# Patient Record
Sex: Male | Born: 1945
Health system: Southern US, Community
[De-identification: ages and names within clinical notes are randomized; demographics above are authoritative.]

## PROBLEM LIST (undated history)

## (undated) DIAGNOSIS — Z9889 Other specified postprocedural states: Secondary | ICD-10-CM

## (undated) DIAGNOSIS — E559 Vitamin D deficiency, unspecified: Secondary | ICD-10-CM

## (undated) DIAGNOSIS — I7581 Atheroembolism of kidney: Secondary | ICD-10-CM

## (undated) DIAGNOSIS — L299 Pruritus, unspecified: Secondary | ICD-10-CM

## (undated) DIAGNOSIS — T4145XA Adverse effect of unspecified anesthetic, initial encounter: Secondary | ICD-10-CM

## (undated) DIAGNOSIS — C801 Malignant (primary) neoplasm, unspecified: Secondary | ICD-10-CM

## (undated) DIAGNOSIS — E785 Hyperlipidemia, unspecified: Secondary | ICD-10-CM

## (undated) DIAGNOSIS — J45909 Unspecified asthma, uncomplicated: Secondary | ICD-10-CM

## (undated) DIAGNOSIS — T7840XA Allergy, unspecified, initial encounter: Secondary | ICD-10-CM

## (undated) DIAGNOSIS — I6529 Occlusion and stenosis of unspecified carotid artery: Secondary | ICD-10-CM

## (undated) DIAGNOSIS — L409 Psoriasis, unspecified: Secondary | ICD-10-CM

## (undated) DIAGNOSIS — N189 Chronic kidney disease, unspecified: Secondary | ICD-10-CM

## (undated) DIAGNOSIS — I480 Paroxysmal atrial fibrillation: Secondary | ICD-10-CM

## (undated) DIAGNOSIS — E162 Hypoglycemia, unspecified: Secondary | ICD-10-CM

## (undated) DIAGNOSIS — R0602 Shortness of breath: Secondary | ICD-10-CM

## (undated) DIAGNOSIS — I2510 Atherosclerotic heart disease of native coronary artery without angina pectoris: Secondary | ICD-10-CM

## (undated) DIAGNOSIS — R112 Nausea with vomiting, unspecified: Secondary | ICD-10-CM

## (undated) DIAGNOSIS — I10 Essential (primary) hypertension: Secondary | ICD-10-CM

## (undated) DIAGNOSIS — M1990 Unspecified osteoarthritis, unspecified site: Secondary | ICD-10-CM

## (undated) DIAGNOSIS — T8859XA Other complications of anesthesia, initial encounter: Secondary | ICD-10-CM

## (undated) DIAGNOSIS — K219 Gastro-esophageal reflux disease without esophagitis: Secondary | ICD-10-CM

## (undated) HISTORY — DX: Hypoglycemia, unspecified: E16.2

## (undated) HISTORY — DX: Unspecified asthma, uncomplicated: J45.909

## (undated) HISTORY — DX: Essential (primary) hypertension: I10

## (undated) HISTORY — DX: Pruritus, unspecified: L29.9

## (undated) HISTORY — PX: OTHER SURGICAL HISTORY: SHX169

## (undated) HISTORY — DX: Gastro-esophageal reflux disease without esophagitis: K21.9

## (undated) HISTORY — PX: LITHOTRIPSY: SUR834

## (undated) HISTORY — DX: Psoriasis, unspecified: L40.9

## (undated) HISTORY — DX: Vitamin D deficiency, unspecified: E55.9

## (undated) HISTORY — DX: Allergy, unspecified, initial encounter: T78.40XA

## (undated) HISTORY — DX: Occlusion and stenosis of unspecified carotid artery: I65.29

## (undated) HISTORY — DX: Paroxysmal atrial fibrillation: I48.0

## (undated) HISTORY — DX: Atheroembolism of kidney: I75.81

## (undated) HISTORY — DX: Atherosclerotic heart disease of native coronary artery without angina pectoris: I25.10

## (undated) HISTORY — DX: Hyperlipidemia, unspecified: E78.5

---

## 2006-03-28 ENCOUNTER — Encounter: Payer: Self-pay | Admitting: Cardiovascular Disease

## 2008-05-21 HISTORY — PX: CARDIAC CATHETERIZATION: SHX172

## 2009-05-17 ENCOUNTER — Ambulatory Visit: Payer: Self-pay | Admitting: Internal Medicine

## 2009-05-17 ENCOUNTER — Observation Stay (HOSPITAL_COMMUNITY): Admission: AD | Admit: 2009-05-17 | Discharge: 2009-05-19 | Payer: Self-pay | Admitting: Internal Medicine

## 2009-05-27 ENCOUNTER — Encounter: Payer: Self-pay | Admitting: Cardiology

## 2009-06-01 ENCOUNTER — Telehealth (INDEPENDENT_AMBULATORY_CARE_PROVIDER_SITE_OTHER): Payer: Self-pay | Admitting: *Deleted

## 2009-06-01 ENCOUNTER — Inpatient Hospital Stay (HOSPITAL_COMMUNITY): Admission: EM | Admit: 2009-06-01 | Discharge: 2009-06-04 | Payer: Self-pay | Admitting: Emergency Medicine

## 2009-06-01 ENCOUNTER — Telehealth: Payer: Self-pay | Admitting: Cardiovascular Disease

## 2009-06-01 ENCOUNTER — Ambulatory Visit: Payer: Self-pay | Admitting: Cardiovascular Disease

## 2009-06-01 ENCOUNTER — Ambulatory Visit: Payer: Self-pay | Admitting: Cardiology

## 2009-06-02 ENCOUNTER — Encounter: Payer: Self-pay | Admitting: Cardiovascular Disease

## 2009-06-03 ENCOUNTER — Encounter: Payer: Self-pay | Admitting: Cardiology

## 2009-06-07 ENCOUNTER — Telehealth (INDEPENDENT_AMBULATORY_CARE_PROVIDER_SITE_OTHER): Payer: Self-pay | Admitting: *Deleted

## 2009-06-09 ENCOUNTER — Ambulatory Visit: Payer: Self-pay | Admitting: Cardiovascular Disease

## 2009-06-13 ENCOUNTER — Encounter: Payer: Self-pay | Admitting: Cardiology

## 2009-06-14 ENCOUNTER — Telehealth: Payer: Self-pay | Admitting: Cardiovascular Disease

## 2009-06-20 ENCOUNTER — Encounter: Payer: Self-pay | Admitting: Cardiology

## 2009-06-23 ENCOUNTER — Ambulatory Visit: Payer: Self-pay | Admitting: Cardiology

## 2009-06-27 ENCOUNTER — Encounter: Payer: Self-pay | Admitting: Cardiology

## 2009-06-27 ENCOUNTER — Ambulatory Visit: Payer: Self-pay

## 2009-06-27 DIAGNOSIS — I739 Peripheral vascular disease, unspecified: Secondary | ICD-10-CM

## 2009-06-28 DIAGNOSIS — I2510 Atherosclerotic heart disease of native coronary artery without angina pectoris: Secondary | ICD-10-CM | POA: Insufficient documentation

## 2009-06-28 DIAGNOSIS — I7589 Atheroembolism of other site: Secondary | ICD-10-CM

## 2009-06-28 DIAGNOSIS — I10 Essential (primary) hypertension: Secondary | ICD-10-CM | POA: Insufficient documentation

## 2009-07-01 ENCOUNTER — Encounter: Payer: Self-pay | Admitting: Cardiology

## 2009-07-04 ENCOUNTER — Encounter: Payer: Self-pay | Admitting: Cardiology

## 2009-07-12 ENCOUNTER — Encounter: Payer: Self-pay | Admitting: Cardiology

## 2009-07-15 ENCOUNTER — Ambulatory Visit: Payer: Self-pay | Admitting: Cardiology

## 2009-07-15 DIAGNOSIS — N259 Disorder resulting from impaired renal tubular function, unspecified: Secondary | ICD-10-CM | POA: Insufficient documentation

## 2009-07-18 ENCOUNTER — Encounter: Payer: Self-pay | Admitting: Cardiology

## 2009-07-19 HISTORY — PX: AV FISTULA PLACEMENT: SHX1204

## 2009-07-25 ENCOUNTER — Encounter: Payer: Self-pay | Admitting: Cardiology

## 2009-07-29 ENCOUNTER — Ambulatory Visit: Payer: Self-pay | Admitting: Vascular Surgery

## 2009-08-01 ENCOUNTER — Ambulatory Visit: Payer: Self-pay | Admitting: Vascular Surgery

## 2009-08-02 ENCOUNTER — Ambulatory Visit (HOSPITAL_COMMUNITY): Admission: RE | Admit: 2009-08-02 | Discharge: 2009-08-02 | Payer: Self-pay | Admitting: Vascular Surgery

## 2009-08-08 ENCOUNTER — Encounter: Payer: Self-pay | Admitting: Cardiology

## 2009-08-09 ENCOUNTER — Encounter: Payer: Self-pay | Admitting: Cardiology

## 2009-08-15 ENCOUNTER — Encounter: Payer: Self-pay | Admitting: Cardiology

## 2009-08-22 ENCOUNTER — Encounter: Payer: Self-pay | Admitting: Cardiology

## 2009-08-30 ENCOUNTER — Encounter: Payer: Self-pay | Admitting: Cardiology

## 2009-09-05 ENCOUNTER — Encounter: Payer: Self-pay | Admitting: Cardiology

## 2009-09-13 ENCOUNTER — Encounter: Payer: Self-pay | Admitting: Cardiology

## 2009-09-16 ENCOUNTER — Ambulatory Visit: Payer: Self-pay | Admitting: Vascular Surgery

## 2009-09-16 ENCOUNTER — Ambulatory Visit: Payer: Self-pay | Admitting: Cardiology

## 2009-09-16 DIAGNOSIS — K219 Gastro-esophageal reflux disease without esophagitis: Secondary | ICD-10-CM | POA: Insufficient documentation

## 2009-09-26 ENCOUNTER — Encounter: Payer: Self-pay | Admitting: Cardiology

## 2009-10-10 ENCOUNTER — Encounter: Payer: Self-pay | Admitting: Cardiology

## 2009-10-13 ENCOUNTER — Encounter: Payer: Self-pay | Admitting: Cardiology

## 2009-10-24 ENCOUNTER — Encounter: Payer: Self-pay | Admitting: Cardiology

## 2009-11-07 ENCOUNTER — Encounter: Payer: Self-pay | Admitting: Cardiology

## 2009-11-11 ENCOUNTER — Encounter: Payer: Self-pay | Admitting: Cardiology

## 2009-11-17 ENCOUNTER — Encounter: Payer: Self-pay | Admitting: Cardiology

## 2009-11-17 ENCOUNTER — Encounter: Payer: Self-pay | Admitting: Internal Medicine

## 2009-11-28 ENCOUNTER — Encounter: Payer: Self-pay | Admitting: Cardiology

## 2009-12-13 ENCOUNTER — Encounter: Payer: Self-pay | Admitting: Cardiology

## 2009-12-27 ENCOUNTER — Encounter: Payer: Self-pay | Admitting: Cardiology

## 2009-12-30 ENCOUNTER — Encounter: Payer: Self-pay | Admitting: Cardiology

## 2010-01-09 ENCOUNTER — Encounter: Payer: Self-pay | Admitting: Cardiology

## 2010-01-26 ENCOUNTER — Encounter: Payer: Self-pay | Admitting: Cardiology

## 2010-02-14 ENCOUNTER — Encounter: Payer: Self-pay | Admitting: Cardiology

## 2010-03-06 ENCOUNTER — Encounter: Payer: Self-pay | Admitting: Cardiology

## 2010-03-21 ENCOUNTER — Encounter: Payer: Self-pay | Admitting: Cardiology

## 2010-04-03 ENCOUNTER — Encounter: Payer: Self-pay | Admitting: Cardiology

## 2010-04-24 ENCOUNTER — Encounter: Payer: Self-pay | Admitting: Cardiology

## 2010-05-09 ENCOUNTER — Encounter: Payer: Self-pay | Admitting: Cardiology

## 2010-05-29 ENCOUNTER — Encounter: Payer: Self-pay | Admitting: Cardiology

## 2010-06-12 ENCOUNTER — Encounter: Payer: Self-pay | Admitting: Cardiology

## 2010-06-20 NOTE — Assessment & Plan Note (Signed)
Summary: f5m   Visit Type:  Follow-up Primary Provider:  Dr Watt Climes  CC:  sob.  History of Present Illness: The patient is 65 years old and return for followup management of atheroemboli. He had a catheterization performed on 1229 2010 which showed nonobstructive coronary disease but he later developed atheroemboli to his legs with progressive renal insufficiency. At catheterization his pulmonary wedge pressure was 23 with only had some diastolic heart failure as well.  Patient has fistula for HD in 07/2009, Patients Cr has been improving recently with latest Cr. 2.7 (highest recoreded Cr. was >4)  he is being followed by renal for that.   Patient today is c/o of SOB that has been occuring since 05/2009, this is mostly exertional and related to eating large meals it lasts for 20-30 minutes after exertion, and is associated with pressure/burining like sensation in his chest which is relieved by Tums which he takes several times a day. He is having these symptoms almost eveyday and is not on a PPI, he states that he has been eating less due to this reflux symptoms and has lost weight due to this.   He c/o turncal and LE rash that he is seeing a dermatologist for, the rash started after he was hospitalized in 07/2009 for his fistula placement. He is currently on a prednisone taper for that which is helping with the rash.   Also he c/o brusing to his back and abdomen that has been present for the past several weeks now   Patient currently denies SOB, Denies CP, Denies fever, chills, nausea, vomiting, diarrhea, constipation Otherwise he doing well and denies any other complaints.     Current Medications (verified): 1)  Amlodipine Besylate 10 Mg Tabs (Amlodipine Besylate) .... Take One Tablet By Mouth Daily 2)  Aspirin 81 Mg Tbec (Aspirin) .... Take One Tablet By Mouth Every Other Day 3)  Apap 325 Mg Tabs (Acetaminophen) .... 2 Tabs As Directed 4)  Finasteride 5 Mg Tabs (Finasteride) .Marland Kitchen.. 1 Tab  Once Daily 5)  Nitrostat 0.4 Mg Subl (Nitroglycerin) .Marland Kitchen.. 1 Tablet Under Tongue At Onset of Chest Pain; You May Repeat Every 5 Minutes For Up To 3 Doses. 6)  Labetalol Hcl 200 Mg Tabs (Labetalol Hcl) .... Take 200mg  Two Tablets By Mouth Two Times A Day 7)  Nephro-Vite .Marland Kitchen.. 1 Tab Once Daily 8)  Furosemide 40 Mg Tabs (Furosemide) .... Take One Tablet By Mouth Daily. 9)  Promethazine Hcl 12.5 Mg Tabs (Promethazine Hcl) .Marland Kitchen.. 1 Tab As Needed 10)  Prednisone .... Pt Taking For A Week Because of Rash 11)  Hydroxyzine Hcl 25 Mg Tabs (Hydroxyzine Hcl) .... For Itching As Directed 12)  Prilosec 20 Mg Cpdr (Omeprazole) .... Take One Tab By Mouth Once Daily  Allergies (verified): 1)  ! Hydrochlorothiazide  Past History:  Past Medical History: Last updated: 06/23/2009 Hx non-obst CAD atheroemboli htn hyperlipidemia  Review of Systems       Negative except per HPI.  Vital Signs:  Patient profile:   65 year old male Height:      70 inches Weight:      161 pounds BMI:     23.18 Pulse rate:   69 / minute BP sitting:   153 / 87  (right arm) Cuff size:   regular  Vitals Entered By: Lubertha Basque, CNA (September 16, 2009 10:27 AM)  Physical Exam  General:  Well-nourished, in no distress   Neck: No JVD, thyroid not enlarged, no carotid bruits Lungs:  No tachypnea, clear without rales, rhonchi or wheezes Cardiovascular: Rhythm regular, PMI not displaced,  heart sounds  normal, no murmurs or gallops, no peripheral edema, pulses normal in all 4 extremities. Abdomen: BS normal, abdomen soft and non-tender without masses or organomegaly, no hepatosplenomegaly. MS: No deformities, no cyanosis or clubbing   Neuro:  No focal sns   Skin:  no lesions    Impression & Recommendations:  Problem # 1:  ATHEROEMBOLISM (ICD-445.89) Assessment Improved He is still having mild problems with his legs and lower extremity pain. however he reports overall improvement. on exam they are dry, warm with good  pulses, as for the brusing this could be related to his atheroemboli, will cut back his ASA to everyother day.  Will see back in 3 months.  Problem # 2:  HYPERTENSION, BENIGN (ICD-401.1) Assessment: Comment Only BP is high after stopping his HCTZ which was thought to be a cause for his rash (which is being followed by Dermatology and is taking prednisone currently for that) today will increase his labetalol to get tighter control of his BP.   The following medications were removed from the medication list:    Hydrochlorothiazide 25 Mg Tabs (Hydrochlorothiazide) .Marland Kitchen... Take one tablet by mouth daily. His updated medication list for this problem includes:    Amlodipine Besylate 10 Mg Tabs (Amlodipine besylate) .Marland Kitchen... Take one tablet by mouth daily    Aspirin 81 Mg Tbec (Aspirin) .Marland Kitchen... Take one tablet by mouth every other day    Labetalol Hcl 200 Mg Tabs (Labetalol hcl) .Marland Kitchen... Take 200mg  two tablets by mouth two times a day    Furosemide 40 Mg Tabs (Furosemide) .Marland Kitchen... Take one tablet by mouth daily.  BP today: 153/87 Prior BP: 152/86 (07/15/2009)  Problem # 3:  RENAL INSUFFICIENCY (ICD-588.9) Assessment: Improved Patient has fistula for HD in 07/2009, Patients Cr has been improving recently with latest Cr. 2.7 (highest recoreded Cr. was >4)  he is being followed by renal. Will continue to follow his progress.   Problem # 4:  GERD (ICD-530.81) Assessment: New Patient has no history of GERD, however he has been taking TUMS several times a day for indegestion/bloating and heartburn which could be contributing to his SOB, will start empiric PPI for symptomatic relief.   His updated medication list for this problem includes:    Prilosec 20 Mg Cpdr (Omeprazole) .Marland Kitchen... Take one tab by mouth once daily  Patient Instructions: 1)  Your physician recommends that you schedule a follow-up appointment in: 3 months. 2)  Your physician has recommended you make the following change in your medication: 1) Start  Prilosec 20mg  once a day , 2) Increase Labetolol to 200mg   two (2) tablets  two times a day, 3) Decrease aspirin to 81mg  by mouth every other day  Prescriptions: LABETALOL HCL 200 MG TABS (LABETALOL HCL) take 200mg  two tablets by mouth two times a day  #360 x 3   Entered by:   Alvis Lemmings, RN, BSN   Authorized by:   Fatima Sanger, MD, [facility]   Signed by:   Alvis Lemmings, RN, BSN on 09/16/2009   Method used:   Print then Give to Patient   RxID:   [id number] PRILOSEC 20 MG CPDR (OMEPRAZOLE) take one tab by mouth once daily  #30 x 6   Entered by:   Alvis Lemmings, RN, BSN   Authorized by:   Fatima Sanger, MD, [facility]   Signed by:   Alvis Lemmings, RN, BSN on 09/16/2009  Method used:   Print then Give to Patient   RxID:   [id number]

## 2010-06-20 NOTE — Letter (Signed)
Summary: [facility] Office Note   [facility] Office Note   Imported By: Sallee Provencal 11/02/2009 16:38:35  _____________________________________________________________________  External Attachment:    Type:   Image     Comment:   External Document

## 2010-06-20 NOTE — Letter (Signed)
Summary: [facility]   Imported By: Marilynne Drivers 02/20/2010 08:07:30  _____________________________________________________________________  External Attachment:    Type:   Image     Comment:   External Document

## 2010-06-20 NOTE — Letter (Signed)
Summary: [facility] Patient Note   [facility] Patient Note   Imported By: Sallee Provencal 12/27/2009 12:27:14  _____________________________________________________________________  External Attachment:    Type:   Image     Comment:   External Document

## 2010-06-20 NOTE — Assessment & Plan Note (Signed)
Summary: f3w   Visit Type:  Follow-up Primary Provider:  Dr Watt Climes  CC:  real bad leg pain and edema.  History of Present Illness: The patient is 65 years old and return for followup management of atheroemboli. He had a catheterization performed on 1229 2010 which showed nonobstructive coronary disease but he later developed atheroemboli to his legs with progressive renal insufficiency. At catheterization his pulmonary wedge pressure was 23 with only had some diastolic heart failure as well.  Since that time his creatinine has gone from 2.2-2.8 and on February 7 was 3.0, on February 14 was 3.7, and February 22 was 3.68. He saw Dr. Justin Mend about 2 weeks ago and Dr. we have told him he probably would need dialysis.  He still been working. 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 He gets fatigued at the end of the day when he walks he gets pain in his legs because of the left of the spine hips.  Current Medications (verified): 1)  Amlodipine Besylate 10 Mg Tabs (Amlodipine Besylate) .... Take One Tablet By Mouth Daily 2)  Aspirin 81 Mg Tbec (Aspirin) .... Take One Tablet By Mouth Daily 3)  Apap 325 Mg Tabs (Acetaminophen) .... 2 Tabs As Directed 4)  Finasteride 5 Mg Tabs (Finasteride) .Marland Kitchen.. 1 Tab Once Daily 5)  Nitrostat 0.4 Mg Subl (Nitroglycerin) .Marland Kitchen.. 1 Tablet Under Tongue At Onset of Chest Pain; You May Repeat Every 5 Minutes For Up To 3 Doses. 6)  Hydrochlorothiazide 25 Mg Tabs (Hydrochlorothiazide) .... Take One Tablet By Mouth Daily. 7)  Labetalol Hcl 200 Mg Tabs (Labetalol Hcl) .... Take One  Tablets By Mouth Twice A Day 8)  Nephro-Vite .Marland Kitchen.. 1 Tab Once Daily 9)  Furosemide 40 Mg Tabs (Furosemide) .... Take One Tablet By Mouth Daily. 10)  Promethazine Hcl 12.5 Mg Tabs (Promethazine Hcl) .Marland Kitchen.. 1 Tab As Needed 11)  Humira Pen 40 Mg/0.26ml Kit (Adalimumab) .Marland Kitchen.. 1 Every Other Week  Allergies (verified): No Known Drug Allergies  Past History:  Past Medical History: Reviewed  history from 06/23/2009 and no changes required. Hx non-obst CAD atheroemboli htn hyperlipidemia  Review of Systems       ROS is negative except as outlined in HPI.   Vital Signs:  Patient profile:   65 year old male Height:      70 inches Weight:      169 pounds BMI:     24.34 Pulse rate:   78 / minute BP sitting:   152 / 86  (left arm) Cuff size:   regular  Vitals Entered By: Lubertha Basque, CNA (July 15, 2009 10:44 AM)  Physical Exam  Additional Exam:  Gen. Well-nourished, in no distress   Neck: No JVD, thyroid not enlarged, no carotid bruits Lungs: No tachypnea, clear without rales, rhonchi or wheezes Cardiovascular: Rhythm regular, PMI not displaced,  heart sounds  normal, no murmurs or gallops, no peripheral edema, pulses normal in all 4 extremities. Abdomen: BS normal, abdomen soft and non-tender without masses or organomegaly, no hepatosplenomegaly. MS: No deformities, no cyanosis or clubbing   Neuro:  No focal sns   Skin:  no lesions    Impression & Recommendations:  Problem # 1:  ATHEROEMBOLISM (ICD-445.89) He is still having significant problems with his legs and lower extremity pain. His only trace edema. He wonders if support hose might help and I think we can try those.  Problem # 2:  RENAL INSUFFICIENCY (ICD-588.9) He has developed progressive renal insufficiency related  to his atheroemboli. He may require dialysis. He is getting weekly creatinines down at work and will have those forwarded to [place] as well as to Dr. Justin Mend.  Problem # 3:  HYPERTENSION, BENIGN (ICD-401.1) His blood pressures at home and they're in the range of 150-170 occasionally. We will increase his labetalol from 200 b.i.d. to 200 t.i.d. I plan to see him back in 8 weeks. The following medications were removed from the medication list:    Metoprolol Tartrate 50 Mg Tabs (Metoprolol tartrate) .Marland Kitchen... Take one tablet by mouth twice a day His updated medication list for this problem  includes:    Amlodipine Besylate 10 Mg Tabs (Amlodipine besylate) .Marland Kitchen... Take one tablet by mouth daily    Aspirin 81 Mg Tbec (Aspirin) .Marland Kitchen... Take one tablet by mouth daily    Hydrochlorothiazide 25 Mg Tabs (Hydrochlorothiazide) .Marland Kitchen... Take one tablet by mouth daily.    Labetalol Hcl 200 Mg Tabs (Labetalol hcl) .Marland Kitchen... Take one  tablets by mouth three times a day    Furosemide 40 Mg Tabs (Furosemide) .Marland Kitchen... Take one tablet by mouth daily.  Patient Instructions: 1)  Your physician has recommended you make the following change in your medication: 1) Increase labetolol to 200mg  one tab by mouth three times a day. 2)  Your physician recommends that you schedule a follow-up appointment in: 8 weeks. 3)  Please fax any lab results you have drawn at work, the [id number]- Attn: Alvis Lemmings- RN for Dr. Olevia Perches. Prescriptions: LABETALOL HCL 200 MG TABS (LABETALOL HCL) Take one  tablets by mouth three times a day  #270 x 3   Entered by:   Alvis Lemmings, RN, BSN   Authorized by:   Fatima Sanger, MD, [facility]   Signed by:   Alvis Lemmings, RN, BSN on 07/15/2009   Method used:   Print then Give to Patient   RxID:   [id number] LABETALOL HCL 200 MG TABS (LABETALOL HCL) Take one  tablets by mouth three times a day  #90 x 6   Entered by:   Alvis Lemmings, RN, BSN   Authorized by:   Fatima Sanger, MD, [facility]   Signed by:   Alvis Lemmings, RN, BSN on 07/15/2009   Method used:   Print then Give to Patient   RxID:   [id number]

## 2010-06-20 NOTE — Letter (Signed)
Summary: [facility] Office Note  [facility] Office Note   Imported By: Sallee Provencal 09/14/2009 16:09:43  _____________________________________________________________________  External Attachment:    Type:   Image     Comment:   External Document

## 2010-06-20 NOTE — Letter (Signed)
Summary: [facility]   Imported By: Marilynne Drivers 12/28/2009 13:39:39  _____________________________________________________________________  External Attachment:    Type:   Image     Comment:   External Document

## 2010-06-20 NOTE — Letter (Signed)
Summary: [facility] Office Note   [facility] Office Note   Imported By: Sallee Provencal 08/18/2009 15:45:52  _____________________________________________________________________  External Attachment:    Type:   Image     Comment:   External Document

## 2010-06-20 NOTE — Progress Notes (Signed)
Summary: req call back  Phone Note Call from Patient Call back at Home Phone [phone]   Caller: Patient Reason for Call: Talk to Nurse Summary of Call: request call back pt is having problems, sch for stress test tomorrow, don't think he can do the test, req to be seen by dr Initial call taken by: Darnell Level,  June 01, 2009 10:25 AM  Follow-up for Phone Call        SPOKE WITH WIFE C/O LEGS CRAMPING BIL FROM HIPS DOWN AND DISCOLORATION ALL OVER LEGS  AND C/O COLD NO SWELLING NOTED .WIFE SPOKE WITH PA ON FRI INSTRUCTED TO HOLD LASIX, KCL, AND CRESTOR . CONT TO HAVE PROBLEMS ON SUN  INSTRUCTED  BY PA  TO HOLD ASA AND BENAZEPRIL.WIFE C/O B/P RUNNING HIGH  AT 180/102 AND 170/100 .APPT SCHEDULED IN OUR [place] OFF AT 3:30 TODAY.WIFE AWARE. Follow-up by: Devra Dopp, LPN,  January 12, 624THL 11:18 AM

## 2010-06-20 NOTE — Progress Notes (Signed)
Summary: Nuclear Pre-Procedure  Phone Note Outgoing Call   Call placed by: Perrin Maltese, EMT-P,  June 01, 2009 3:07 PM Summary of Call: Reviewed information on [employer] Information Sheet (see scanned document for further details).  Busy x 3     Nuclear Med Background Indications for Stress Test: Evaluation for Ischemia, Post Hospital  Indications Comments: Admitted 05/17/09 CP/DOE (-) enzymes  History: Heart Catheterization  History Comments: 05/18/09 EF 65% N/O CAD 50-70% RCA   Symptoms: Chest Pressure with Exertion, Chest Tightness, Palpitations    Nuclear Pre-Procedure Cardiac Risk Factors: Hypertension, Lipids  Nuclear Med Study Referring MD:  B.Olevia Perches

## 2010-06-20 NOTE — Miscellaneous (Signed)
Summary: Orders Update  Clinical Lists Changes  Problems: Added new problem of INTERMITTENT CLAUDICATION, BILATERAL (ICD-443.9) Orders: Added new Test order of Arterial Duplex Lower Extremity (Arterial Duplex Low) - Signed

## 2010-06-20 NOTE — Assessment & Plan Note (Signed)
Summary: eph   Visit Type:  Follow-up Primary Provider:  Dr Watt Climes  CC:  leg  and hip pain  - no chest pain.  History of Present Illness: The patient is 65 years old and returns for a followup visit on referral from Dr. Zenia Resides because of persistent leg pain due to atheroemboli.  He was evaluated with cardiac catheterization on May 18, 2009 for chest pain and was found to have nonobstructive CAD. He was thought to have diastolic heart failure with a wedge pressure of 23. et and legs after that he developed pain in his feet and legs and discoloration. He was readmitted on January 15 with a diagnosis of atheroemboli. His renal function had increased from a creatinine of 1.4-2.0-2.2. He was also found to have high in the process I believe on the left side while he was in the hospital but the etiology was not clear from the records.  He has continued to have pain in both his hips legs and feet both at rest and also aggravated by exertion. He comes in today for further consultation regarding these symptoms. His creatinine has continued to rise and was 2.5 and then on January 31 was 2.8. He is scheduled to see Dr. Edrick Oh with nephrology next Friday.  Current Medications (verified): 1)  Metoprolol Tartrate 50 Mg Tabs (Metoprolol Tartrate) .... Take One Tablet By Mouth Twice A Day 2)  Amlodipine Besylate 10 Mg Tabs (Amlodipine Besylate) .... Take One Tablet By Mouth Daily 3)  Aspirin 81 Mg Tbec (Aspirin) .... Take One Tablet By Mouth Daily 4)  Crestor 10 Mg Tabs (Rosuvastatin Calcium) .... Take One Tablet By Mouth Daily. 5)  Apap 325 Mg Tabs (Acetaminophen) .... 2 Tabs As Directed 6)  Finasteride 5 Mg Tabs (Finasteride) .Marland Kitchen.. 1 Tab Once Daily 7)  Nitrostat 0.4 Mg Subl (Nitroglycerin) .Marland Kitchen.. 1 Tablet Under Tongue At Onset of Chest Pain; You May Repeat Every 5 Minutes For Up To 3 Doses.  Allergies (verified): No Known Drug Allergies  Past History:  Past Medical History: Hx non-obst  CAD atheroemboli htn hyperlipidemia  Review of Systems       ROS is negative except as outlined in HPI.   Vital Signs:  Patient profile:   65 year old male Height:      70 inches Weight:      169 pounds BMI:     24.34 Pulse rate:   69 / minute BP sitting:   168 / 90  (left arm) Cuff size:   regular  Vitals Entered By: Lubertha Basque, CNA (June 23, 2009 11:47 AM)  Physical Exam  Additional Exam:  Gen. Well-nourished, in no distress   Neck: No JVD, thyroid not enlarged, no carotid bruits Lungs: No tachypnea, clear without rales, rhonchi or wheezes Cardiovascular: Rhythm regular, PMI not displaced,  heart sounds  normal, no murmurs or gallops, no peripheral edema, pulses normal in all 4 extremities. Abdomen: BS normal, abdomen soft and non-tender without masses or organomegaly, no hepatosplenomegaly. MS: No deformities, no cyanosis or clubbing   Neuro:  No focal sns   Skin:  no lesions  Extremties:  discoloration of toes and LE c/w atheroemboli   Impression & Recommendations:  Problem # 1:  ATHEROEMBOLISM (ICD-445.89) He has clear symptoms of atheroemboli related to his recent cath.  His renal fct is impaired.  We will get arterial dopplers to evaluate further and BMP.  He has apt with renal next week  Problem # 2:  CAD,  NATIVE VESSEL (ICD-414.01) He has non-obstr coronary artery disease and should have secondary risk factor modification. His updated medication list for this problem includes:    Metoprolol Tartrate 50 Mg Tabs (Metoprolol tartrate) .Marland Kitchen... Take one tablet by mouth twice a day    Amlodipine Besylate 10 Mg Tabs (Amlodipine besylate) .Marland Kitchen... Take one tablet by mouth daily    Aspirin 81 Mg Tbec (Aspirin) .Marland Kitchen... Take one tablet by mouth daily    Nitrostat 0.4 Mg Subl (Nitroglycerin) .Marland Kitchen... 1 tablet under tongue at onset of chest pain; you may repeat every 5 minutes for up to 3 doses.  Problem # 3:  HYPERTENSION, BENIGN (ICD-401.1) Controlled on current meds. The  following medications were removed from the medication list:    Clonidine Hcl 0.1 Mg Tabs (Clonidine hcl) .Marland Kitchen... Take one tablet by mouth twice daily His updated medication list for this problem includes:    Metoprolol Tartrate 50 Mg Tabs (Metoprolol tartrate) .Marland Kitchen... Take one tablet by mouth twice a day    Amlodipine Besylate 10 Mg Tabs (Amlodipine besylate) .Marland Kitchen... Take one tablet by mouth daily    Aspirin 81 Mg Tbec (Aspirin) .Marland Kitchen... Take one tablet by mouth daily    Hydrochlorothiazide 25 Mg Tabs (Hydrochlorothiazide) .Marland Kitchen... Take one tablet by mouth daily.  Patient Instructions: 1)  Your physician recommends that you schedule a follow-up appointment in: 3 weeks. 2)  Your physician recommends that you have lab work on monday/ tuesday of next week (at your work): bmet/tsh (414.01;593.9) 3)  Your physician has recommended you make the following change in your medication: 1) Start Hydrochlorothiazide (HCTZ) 25mg  once daily.  4)  Your physician has requested that you have a lower extremity arterial duplex.  This test is an ultrasound of the arteries in the legs or arms.  It looks at arterial blood flow in the legs and arms.  Allow one hour for Lower and Upper Arterial scans. There are no restrictions or special instructions. Prescriptions: HYDROCHLOROTHIAZIDE 25 MG TABS (HYDROCHLOROTHIAZIDE) Take one tablet by mouth daily.  #30 x 11   Entered by:   Alvis Lemmings, RN, BSN   Authorized by:   Fatima Sanger, MD, [facility]   Signed by:   Alvis Lemmings, RN, BSN on 06/23/2009   Method used:   Electronically to        [id number] (retail)       [address], [place]  [address]       Ph: [phone] or [phone]       Fax: [phone]   RxID:   [id number]

## 2010-06-20 NOTE — Progress Notes (Signed)
  Found FMLA papers in [place], forwarded to [facility] for processing. Joelene Millin Mesiemore  June 07, 2009 1:41 PM

## 2010-06-20 NOTE — Progress Notes (Signed)
  Phone Note Outgoing Call   Call placed by: Rhett Bannister LPN Call placed to: Patient Summary of Call: [facility] for patient to come by office and pick up order for BMP to be drawn on Mon. 06/20/09 per Dr. Zenia Resides.

## 2010-07-06 ENCOUNTER — Encounter: Payer: Self-pay | Admitting: Cardiology

## 2010-08-06 LAB — CBC
Hemoglobin: 14.7 g/dL (ref 13.0–17.0)
MCHC: 35.4 g/dL (ref 30.0–36.0)
MCHC: 35.5 g/dL (ref 30.0–36.0)
RBC: 4.54 MIL/uL (ref 4.22–5.81)
RDW: 13.3 % (ref 11.5–15.5)

## 2010-08-06 LAB — UIFE/LIGHT CHAINS/TP QN, 24-HR UR
Beta, Urine: DETECTED — AB
Free Lambda Lt Chains,Ur: 0.83 mg/dL (ref 0.08–1.01)
Total Protein, Urine: 6.1 mg/dL

## 2010-08-06 LAB — PROTEIN ELECTROPH W RFLX QUANT IMMUNOGLOBULINS
Albumin ELP: 59.7 % (ref 55.8–66.1)
Beta 2: 5 % (ref 3.2–6.5)
Total Protein ELP: 5.9 g/dL — ABNORMAL LOW (ref 6.0–8.3)

## 2010-08-06 LAB — DIFFERENTIAL
Basophils Absolute: 0 10*3/uL (ref 0.0–0.1)
Basophils Relative: 0 % (ref 0–1)
Eosinophils Absolute: 1.4 10*3/uL — ABNORMAL HIGH (ref 0.0–0.7)
Eosinophils Relative: 15 % — ABNORMAL HIGH (ref 0–5)
Lymphocytes Relative: 28 % (ref 12–46)
Monocytes Absolute: 0.8 10*3/uL (ref 0.1–1.0)

## 2010-08-06 LAB — BASIC METABOLIC PANEL
BUN: 23 mg/dL (ref 6–23)
BUN: 25 mg/dL — ABNORMAL HIGH (ref 6–23)
CO2: 27 mEq/L (ref 19–32)
Calcium: 8.3 mg/dL — ABNORMAL LOW (ref 8.4–10.5)
Calcium: 8.8 mg/dL (ref 8.4–10.5)
Chloride: 108 mEq/L (ref 96–112)
Creatinine, Ser: 1.99 mg/dL — ABNORMAL HIGH (ref 0.4–1.5)
Creatinine, Ser: 2.05 mg/dL — ABNORMAL HIGH (ref 0.4–1.5)
Creatinine, Ser: 2.18 mg/dL — ABNORMAL HIGH (ref 0.4–1.5)
GFR calc Af Amer: 41 mL/min — ABNORMAL LOW (ref >60)
GFR calc non Af Amer: 31 mL/min — ABNORMAL LOW (ref >60)
Glucose, Bld: 104 mg/dL — ABNORMAL HIGH (ref 70–99)
Glucose, Bld: 105 mg/dL — ABNORMAL HIGH (ref 70–99)
Glucose, Bld: 99 mg/dL (ref 70–99)

## 2010-08-06 LAB — HIV ANTIBODY (ROUTINE TESTING W REFLEX): HIV: NONREACTIVE

## 2010-08-06 LAB — C-REACTIVE PROTEIN: CRP: 0.2 mg/dL — ABNORMAL LOW (ref <0.6)

## 2010-08-06 LAB — POCT I-STAT, CHEM 8
BUN: 26 mg/dL — ABNORMAL HIGH (ref 6–23)
Calcium, Ion: 1.14 mmol/L (ref 1.12–1.32)
Hemoglobin: 13.9 g/dL (ref 13.0–17.0)
TCO2: 26 mmol/L (ref 0–100)

## 2010-08-06 LAB — SEDIMENTATION RATE: Sed Rate: 12 mm/hr (ref 0–16)

## 2010-08-06 LAB — HEPATITIS C ANTIBODY: HCV Ab: NEGATIVE

## 2010-08-06 LAB — HEPATIC FUNCTION PANEL
AST: 23 U/L (ref 0–37)
Bilirubin, Direct: 0.1 mg/dL (ref 0.0–0.3)
Total Bilirubin: 0.6 mg/dL (ref 0.3–1.2)

## 2010-08-06 LAB — RHEUMATOID FACTOR: Rhuematoid fact SerPl-aCnc: <20 IU/mL (ref 0–20)

## 2010-08-09 ENCOUNTER — Encounter: Payer: Self-pay | Admitting: Cardiology

## 2010-08-14 LAB — POCT I-STAT 4, (NA,K, GLUC, HGB,HCT)
Hemoglobin: 12.6 g/dL — ABNORMAL LOW (ref 13.0–17.0)
Sodium: 140 mEq/L (ref 135–145)

## 2010-08-14 LAB — CREATININE, SERUM
Creatinine, Ser: 3.82 mg/dL — ABNORMAL HIGH (ref 0.4–1.5)
GFR calc Af Amer: 20 mL/min — ABNORMAL LOW (ref >60)
GFR calc non Af Amer: 16 mL/min — ABNORMAL LOW (ref >60)

## 2010-08-21 LAB — BASIC METABOLIC PANEL
BUN: 16 mg/dL (ref 6–23)
BUN: 19 mg/dL (ref 6–23)
CO2: 27 mEq/L (ref 19–32)
Chloride: 108 mEq/L (ref 96–112)
Creatinine, Ser: 1.17 mg/dL (ref 0.4–1.5)
Creatinine, Ser: 1.37 mg/dL (ref 0.4–1.5)
GFR calc non Af Amer: >60 mL/min (ref >60)
Glucose, Bld: 102 mg/dL — ABNORMAL HIGH (ref 70–99)
Potassium: 4 mEq/L (ref 3.5–5.1)

## 2010-08-21 LAB — CARDIAC PANEL(CRET KIN+CKTOT+MB+TROPI): Troponin I: 0.01 ng/mL (ref 0.00–0.06)

## 2010-08-21 LAB — CBC
HCT: 41.2 % (ref 39.0–52.0)
HCT: 42.8 % (ref 39.0–52.0)
HCT: 43.9 % (ref 39.0–52.0)
Hemoglobin: 15.3 g/dL (ref 13.0–17.0)
MCHC: 34.9 g/dL (ref 30.0–36.0)
MCHC: 35.2 g/dL (ref 30.0–36.0)
MCV: 91 fL (ref 78.0–100.0)
MCV: 91.9 fL (ref 78.0–100.0)
Platelets: 170 10*3/uL (ref 150–400)
Platelets: 173 10*3/uL (ref 150–400)
RDW: 13.4 % (ref 11.5–15.5)
RDW: 13.7 % (ref 11.5–15.5)

## 2010-08-21 LAB — HEPATIC FUNCTION PANEL
AST: 31 U/L (ref 0–37)
Alkaline Phosphatase: 82 U/L (ref 39–117)
Bilirubin, Direct: 0.2 mg/dL (ref 0.0–0.3)
Total Bilirubin: 0.8 mg/dL (ref 0.3–1.2)

## 2010-08-21 LAB — HEPARIN LEVEL (UNFRACTIONATED)
Heparin Unfractionated: 0.19 IU/mL — ABNORMAL LOW (ref 0.30–0.70)
Heparin Unfractionated: 0.98 IU/mL — ABNORMAL HIGH (ref 0.30–0.70)

## 2010-08-21 LAB — POCT I-STAT 3, VENOUS BLOOD GAS (G3P V)
Bicarbonate: 23 mEq/L (ref 20.0–24.0)
TCO2: 24 mmol/L (ref 0–100)
pCO2, Ven: 40.5 mmHg — ABNORMAL LOW (ref 45.0–50.0)
pH, Ven: 7.363 — ABNORMAL HIGH (ref 7.250–7.300)
pO2, Ven: 33 mmHg (ref 30.0–45.0)

## 2010-08-21 LAB — LIPID PANEL
Cholesterol: 237 mg/dL — ABNORMAL HIGH (ref 0–200)
Total CHOL/HDL Ratio: 6.2 RATIO

## 2010-08-21 LAB — POCT I-STAT 3, ART BLOOD GAS (G3+)
TCO2: 24 mmol/L (ref 0–100)
pCO2 arterial: 37.8 mmHg (ref 35.0–45.0)
pH, Arterial: 7.385 (ref 7.350–7.450)

## 2010-09-27 ENCOUNTER — Encounter: Payer: Self-pay | Admitting: Cardiology

## 2010-10-03 NOTE — Assessment & Plan Note (Signed)
[facility] OFFICE NOTE   NAME:Jack [name], Jack [name]                        MRN:          [id number]  DATE:06/09/2009                            DOB:          [birth year]    PROBLEM LIST:  1. Coronary artery disease as diagnosed for left heart      catheterization.  On May 18, 2009, showed an LAD with 30%      proximal and 40% mid lesions.  There are irregularities in the      circumflex.  There is a 50-70% narrowing in the mid RCA and 50-70%      narrowing in the distal RCA and ejection fraction was estimated to      be 65%.  Initial plan was to perform outpatient heart      catheterization in order to demonstrate ischemia prior to      proceeding with any possible percutaneous intervention.  2. Probable cholesterol embolism presenting approximately much of      potential cholesterol embolism with bilateral lower extremity      livedo reticularis, an increased creatinine, and exertional      bilateral lower extremity discomfort.  3. Hyperlipidemia.  Last LDL on May 18, 2009, was 173, HDL 38,      triglycerides 128.  4. Hypertension.  5. Kidney stones.  In the past, he was told that one of his kidneys      was only functioning about 20%.  6. Psoriatic arthritis.   INTERVAL HISTORY:  The patient initially presented to me on June 01, 2009, stating that he had bilateral lower extremity rash and discomfort  starting a day or two after his heart catheterization.  He initially  stopped medications that were new to him including Crestor, aspirin, and  an ACE inhibitor.  This provided no relief with the discomfort.  On the  day of the office visit, we admitted him for evaluation.  It was found  that his creatinine increased to 2.  In the hospital, he underwent an  abdominal ultrasound which showed no evidence of abdominal aortic  aneurysm.  He was started back on his statin medication as well as  amlodipine  for blood pressure control.  Since the patient has been home,  he has noticed no appreciable change in his symptoms.  He continues to  have bilateral lower extremity discomfort with exertion, but not at  rest.   PHYSICAL EXAMINATION:  VITAL SIGNS:  Blood pressure is 149/86 in the  right arm, 152/89 in the left arm, pulse is 62, satting 97% on room air.  He weighs 169 pounds.  GENERAL:  No acute distress.  HEENT:  Normocephalic, atraumatic.  NECK:  Supple.  There is no JVD.  There are no carotid bruits.  HEART:  Regular rate and rhythm without murmur, rub, or gallop.  LUNGS:  Clear bilaterally.  ABDOMEN:  Soft, nontender, nondistended.  EXTREMITIES:  Without edema.  SKIN:  The patient continues to have livedo reticularis to bilateral  lower extremities, and he has some minor discoloration of the plantar  aspect of  his left foot that he described as a light bluish color.   LABORATORY DATA:  From 5 days ago upon hospital discharge, at that time  his BUN was 23, his creatinine was 2.05, calcium of 8.7.   ASSESSMENT/PLAN:  It appears the patient has had a cholesterol embolism  after his left heart catheterization.  Regarding his coronary artery  disease, he is not having any symptoms consistent with unstable angina.  He has described occasional chest discomfort that is self-limiting and  is currently not bothersome to him.  Of most importance currently is  risk factor modification in the form of statin therapy to address his  LDL and improved blood pressure control.  He is currently on Crestor 10  mg daily, and he will likely need to have an increased dose; however, we  will do this very slowly as he is already having muscle pain that is  likely secondary to the cholesterol embolism.  We will likely increase  his Crestor to 20 mg at his next office visit in 1 month.  Today, we  will institute therapy with Lopressor 25 mg b.i.d. in hopes of improving  his blood pressure control.  He will  call next week with blood pressure  readings from home.  He is scheduled to see Nephrology in follow up for  the renal failure and we will facilitate this appointment.  We discussed  the possible options of more thorough imaging of the thoracic and  abdominal aorta to rule out any unstable plaque and the possible  invasive approaches to fixing such an issue.  After much discussion at  this time, we will hold off on this approach and continue with  aggressive risk factor modification.  We will see the patient back in 1  month's time, and he will see Nephrology in the interim.     Arlee Muslim, MD  Electronically Signed    SGA/MedQ  DD: 06/09/2009  DT: 06/10/2009  Job #: [id number]

## 2010-10-03 NOTE — Assessment & Plan Note (Signed)
[facility] OFFICE NOTE   NAME:Jack [name], Jack [name]                        MRN:          [id number]  DATE:06/01/2009                            DOB:          05-Sep-1945    CHIEF COMPLAINT:  Bilateral leg pain and rash.   HISTORY OF PRESENT ILLNESS:  Mr. Mccleland is a 65 year old white male with  past medical history significant for hypertension, BPH, kidney stones  approximately 10 years ago, he states one of his kidneys is only 20%  viable, the other is okay per a nuclear study, psoriatic arthritis and  coronary disease who is presenting approximately 10 days after left  heart catheterization with lower extremity pain and some discoloration  of the lower extremities.  The patient states that the day after his  heart catheterization, he began to have bilateral extremity discomfort  with movement.  This pain has progressed. He states that at rest he does  not have pain.  He also has noticed a mottled appearance of his  bilateral lower extremities that has persisted since his heart  catheterization.  The site of heart catheterization of the right groin  has been healing well.  The patient also states he has been having some  mild intermittent fleeting chest discomfort that is not different than  before.  He has been compliant with his medications until he called the  on-call PA 5 days prior to this office visit and he was told to stop all  of his new medications which included aspirin, Crestor,  Lasix,  benazepril, and acetaminophen.  The patient states that since he has  this medication, the pain has persisted but the mottled appearance of is  lower extremities has improved only mildly.   PAST MEDICAL HISTORY:  As above in the HPI.   SOCIAL HISTORY:  No tobacco, no alcohol.   FAMILY HISTORY:  Negative for premature coronary disease.   ALLERGIES:  No known drug allergies.   MEDICATIONS:  The patient is a  currently taking nadolol 40 mg daily,  herbal supplements, finasteride 5 mg daily and multivitamin, as well as  saw palmetto.   REVIEW OF SYSTEMS:  As in HPI.  Other systems are reviewed and they are  negative.  Of note, the patient is scheduled to undergo an outpatient  stress test tomorrow to help determine whether percutaneous intervention  will be needed.   PHYSICAL EXAMINATION:  Blood pressure 184/102 in the right arm, 177/97  in the left arm.  His pulse is 56.  He weighs 171 pounds.  He is satting  98% on room air.  GENERAL:  No acute distress.  HEENT:  Normocephalic, atraumatic.  NECK:  Supple.  There is no JVD.  No carotid bruits.  HEART:  Regular rate and rhythm without murmur, rub or gallop.  LUNGS:  Clear bilaterally.  ABDOMEN:  Soft, nontender, nondistended.  EXTREMITIES:  Without edema.  Pulses:  He has 2+ bilateral carotid,  radial, femoral, popliteal and dorsalis pedis pulses.  SKIN:  Warm and dry.  However, he does have a mottled appearance to his  lower extremities consistent with livedo reticularis.  PSYCH:  The patient is appropriate.  Normal insight.  NEURO:  Exam is nonfocal.   EKG taken today in clinic demonstrates sinus bradycardia with a rate of  48 beats per minute, T-wave amplitude in the precordial lead is  approximately 10 mm.   On review of the patient's heart catheterization, the patient's LAD and  diagonal system had sub 40% narrowing.  His right coronary artery was a  moderate sized vessel that had a 50-70% narrowing mid vessel and 50-70%  near the distal end.  His EF was normal.   ASSESSMENT/PLAN:  This is a 65 year old white male who is status post  heart catheterization approximately 10 days ago now presenting with  lower extremity pain and mottled skin appearance that is consistent with  livedo reticularis.  His new medications have been stopped over the  phone in the initial thought that perhaps this was a side effect to  Crestor or one of  the other medications.  Although this certainly  possible, the diagnosis of cholesterol emboli must also be entertained.  We will start with immediately checking a CMP, CBC, sed rate, CRP, and  CPK.  He is written for hydrochlorothiazide 25 mg today in clinic to  help address the hypertension.  If he has any a significant lab work  abnormalities, medical admission may be the best option.     Arlee Muslim, MD  Electronically Signed    SGA/MedQ  DD: 06/01/2009  DT: 06/01/2009  Job #: [id number]

## 2010-10-03 NOTE — Assessment & Plan Note (Signed)
[facility]                                 ON-CALL NOTE   NAME:Jack [name], Jack [name]                        MRN:          [id number]  DATE:05/29/2009                            DOB:          Jun 13, 1945    PRIMARY CARDIOLOGIST:  Vanna Scotland. Olevia Perches, MD, [facility].   PROBLEM:  Mr. Lashomb wife contacted me this morning, concerned that  her husband has developed broken blood veins.  He apparently had a  recent cardiac catheterization here at [facility], revealing nonobstructive  CAD.  He was discharged on Lasix, potassium, and benazepril, which were  new.  They contacted our office on Friday, complaining of cramps, and  were instructed to get some blood work and to hold Lasix, potassium, and  Crestor.  Ms. Strenger informs me that the potassium level was 5.4.   Today, the patient continues to experience cramps.  He has also  developed these diffuse lesions, which the wife refers to as broken  blood vessels.  There is no evidence of any bleeding.  His groin  incision site remains stable, though bruised.  His blood pressure has  remained stable, as well.   PLAN:  I advised Ms. Demeritt to have the patient hold his aspirin this  evening, so as to not exacerbate any possible bleeding or bruising which  may be ongoing.  He is also to hold his benazepril.  The patient  apparently has one functioning kidney.  Recent blood work suggests  normal function, however.  I also assured her that I would arrange for  our office to schedule for repeat blood work tomorrow.  The patient is  also to present to a walk-in clinic, or here at the ED, if his symptoms  worsen.  In the meanwhile, she will notify her husband's primary care  physician, Dr. Truman Hayward, and may possibly arrange to have her husband seen  tomorrow.  She appreciated the call back, and was in agreement with this  plan.     Gene Serpe, PA-C  Electronically Signed    GS/MedQ  DD: 05/29/2009  DT: 05/29/2009  Job #: [id number]

## 2010-10-03 NOTE — Procedures (Signed)
CEPHALIC VEIN MAPPING   INDICATION:  Upper extremity vein mapping, preop AVF.   HISTORY:  Chronic kidney disease.   EXAM:   The right cephalic vein is compressible.   Diameter measurements range from 0.31 cm to 0.51 cm.   The left cephalic vein is compressible except for areas in mid forearm.   Diameter measurements range from 0.15 cm to 0.49 cm, however, 0.36 cm to  0.49 cm in the brachium.   See attached worksheet for all measurements.   IMPRESSION:  1. Patent right cephalic vein and left cephalic vein in the brachium      are of acceptable diameter for use as a dialysis access site.  2. The left cephalic vein in forearm shows evidence of possible      chronic thrombus.  3. Bilateral basilic veins appear patent with measurements as shown on      attached drawing.   ___________________________________________  Rosetta Posner, M.D.   AS/MEDQ  D:  07/29/2009  T:  07/29/2009  Job:  [id number]

## 2010-10-03 NOTE — Assessment & Plan Note (Signed)
OFFICE VISIT   Jack [name], Jack [name]  DOB:  02/10/1946                                       09/16/2009  CHART#:[id number]   Date of surgery 08/02/2009 status post left upper arm AV fistula.   HISTORY OF PRESENT ILLNESS:  The patient is a 65 year old gentleman who  had a left upper arm AV fistula placed on 08/02/2009 after his  creatinine went into the 4 range following cardiac cath.  We were asked  to place a fistula for possible hemodialysis.  The patient is doing  well.  He has no signs of steal.  He has no complaints of pain, numbness  or tingling in his left hand.  The left arm wounds are healing well.  He  does state he has had a rash since surgery and states he may have a  latex allergy which he did not tell us at the time of surgery.  Otherwise he is doing very well with no complaints.   PHYSICAL EXAM:  This is a well-developed, well-nourished gentleman in no  acute distress.  Heart rate was 83, saturation was 99, his respiratory  rate was 10.  Left upper extremity wound was healing well in the left  antecubital space, he had an excellent thrill and bruit in the left  upper arm radius cephalic fistula.  He had 2+ radial pulse.  The vein  was very easily palpable.   ASSESSMENT AND PLAN:  Status post arteriovenous fistula left upper  extremity with recovering kidney function.  The patient will follow up  on an as-needed basis.   Wray Kearns, PA-C   Rosetta Posner, M.D.  Electronically Signed   RR/MEDQ  D:  09/16/2009  T:  09/16/2009  Job:  [id number]

## 2010-10-03 NOTE — Consult Note (Signed)
NEW PATIENT CONSULTATION   Jack [name], Jack [name]  DOB:  1946-02-21                                       07/29/2009  CHART#:[id number]   Patient presents today for evaluation of AV access.  He is a very nice  65 year old gentleman with progressive renal insufficiency.  He had a  cardiac catheterization in December of [id number] and had a complication of  atheroemboli.  He has had progressive renal insufficiency since that  time.  I am seeing him at the request of Dr. Justin Mend for discussion of  access placement and planning for a potential need for hemodialysis.   PAST MEDICAL HISTORY:  Significant for hypertension, BPH,  nephrolithiasis.   ALLERGIES:  None.   FAMILY HISTORY:  Negative for premature atherosclerotic disease.   SOCIAL HISTORY:  He is married.  He works as a Manufacturing engineer.  He does not  smoke, having quit in 1972.  He does not drink alcohol.   REVIEW OF SYSTEMS:  Multiply positive.  He has no weight loss or weight  gain.  His weight is 160 pounds.  He is 5 feet 10 inches tall.  He does  have shortness of breath with exertion.  GI:  Negative for bleeding.  GU:  Urinary frequency and renal insufficiency.  He does have pain in his feet with walking following the embolus.  NEUROLOGIC:  Positive for headaches.  MUSCULOSKELETAL:  Arthritis.  PSYCHIATRIC:  Negative for depression or anxiety.  HEENT:  Negative for change in his eyesight or hearing.  HEMATOLOGIC:  Without bleeding problems.  SKIN:  Does have rashes on his arms, head and groin.   PHYSICAL EXAMINATION:  A well-developed and well-nourished white male  appearing his stated age in no acute stress.  Blood pressure 161/83,  pulse 69, respirations 18.  His temperature is 98.1.  HEENT is normal.  Chest is clear bilaterally.  Abdomen is soft.  No tenderness.  No  masses.  Musculoskeletal:  No major deformities or cyanosis.  Neurologic:  Without focal weakness or paresthesias.  Skin with rashes  over his arms.  He  does have 2+ radial pulses bilaterally.  He does have  well-developed cephalic veins bilaterally.   He underwent noninvasive vascular laboratory studies, and I reviewed  this with patient and his wife, which reveals an adequate sized upper  arm cephalic vein on the left and adequate cephalic vein throughout his  right arm.  He is left handed.   I had a very long discussion with the patient and his wife present.  I  explained the options of hemodialysis catheter for acute hemodialysis  and also the option of AV fistula versus AV graft.  I have recommended  that we proceed with a left arm AV fistula creation.  He understands  this and wishes to proceed as soon as possible.  He understands the  potential of nonmaturation of the fistula and eventual need for other  access options.  We have scheduled this procedure at his convenience on  03/15 at [place], M.D.  Electronically Signed   TFE/MEDQ  D:  07/29/2009  T:  07/29/2009  Job:  [id number]   cc:   Sherril Croon, M.D.  Bruce Alfonso Patten Olevia Perches, MD, [facility]

## 2010-11-02 ENCOUNTER — Encounter: Payer: Self-pay | Admitting: Cardiology

## 2010-11-14 ENCOUNTER — Encounter: Payer: Self-pay | Admitting: Cardiovascular Disease

## 2010-11-30 ENCOUNTER — Encounter: Payer: Self-pay | Admitting: Cardiology

## 2010-12-18 ENCOUNTER — Encounter: Payer: Self-pay | Admitting: Cardiology

## 2011-01-03 ENCOUNTER — Encounter: Payer: Self-pay | Admitting: Cardiology

## 2011-01-30 ENCOUNTER — Telehealth: Payer: Self-pay | Admitting: Cardiology

## 2011-01-30 NOTE — Telephone Encounter (Signed)
Will forward to scheduler to determine which MD pt needs to see.

## 2011-01-30 NOTE — Telephone Encounter (Signed)
Pt wife calling stating that Dr. Marcello Moores (PCP) is suppose to call to set up appt for [facility]. Pt wife said maybe Dr. Aundra Dubin called back and spoke w/ Dr. Marcello Moores to schedule appt. Pt wife said he was in a-fib. Pt saw Hochrein in the hospital in the past. Please return pt call to discuss further. Pt wife would like pt to see Dr. Angelena Form or Dr. Burt Knack. Pt prefers [facility] yet pt needs to see someone right away, pt is out of work until he sees a cardiologist.

## 2011-01-31 ENCOUNTER — Ambulatory Visit (INDEPENDENT_AMBULATORY_CARE_PROVIDER_SITE_OTHER): Payer: Managed Care, Other (non HMO) | Admitting: Cardiovascular Disease

## 2011-01-31 ENCOUNTER — Encounter: Payer: Self-pay | Admitting: Cardiovascular Disease

## 2011-01-31 DIAGNOSIS — I4819 Other persistent atrial fibrillation: Secondary | ICD-10-CM | POA: Diagnosis present

## 2011-01-31 DIAGNOSIS — N259 Disorder resulting from impaired renal tubular function, unspecified: Secondary | ICD-10-CM

## 2011-01-31 DIAGNOSIS — L089 Local infection of the skin and subcutaneous tissue, unspecified: Secondary | ICD-10-CM

## 2011-01-31 DIAGNOSIS — L988 Other specified disorders of the skin and subcutaneous tissue: Secondary | ICD-10-CM

## 2011-01-31 DIAGNOSIS — I10 Essential (primary) hypertension: Secondary | ICD-10-CM

## 2011-01-31 DIAGNOSIS — I7589 Atheroembolism of other site: Secondary | ICD-10-CM

## 2011-01-31 DIAGNOSIS — I480 Paroxysmal atrial fibrillation: Secondary | ICD-10-CM

## 2011-01-31 DIAGNOSIS — I4891 Unspecified atrial fibrillation: Secondary | ICD-10-CM

## 2011-01-31 MED ORDER — AMLODIPINE BESYLATE 10 MG PO TABS: 10.0000 mg | ORAL_TABLET | Freq: Every day | ORAL | Status: DC

## 2011-01-31 MED ORDER — LABETALOL HCL 200 MG PO TABS: 200.0000 mg | ORAL_TABLET | Freq: Three times a day (TID) | ORAL | Status: DC

## 2011-01-31 NOTE — Patient Instructions (Signed)
Your physician recommends that you schedule a follow-up appointment in: [place] has recommended you make the following change in your medication: STOP COUMADIN Your physician has requested that you have an echocardiogram. Echocardiography is a painless test that uses sound waves to create images of your heart. It provides your doctor with information about the size and shape of your heart and how well your heart's chambers and valves are working. This procedure takes approximately one hour. There are no restrictions for this procedure. DX AFIB  You have been referred to DR TODD EARLY  RE FISTULA IN [facility]

## 2011-01-31 NOTE — Assessment & Plan Note (Signed)
Although atheroemboli likely related to cath procedure coumadin can make this syndrome worse.  Mali score is 1 with HTN.  In NSR now.  Prefer no coumadin at this point

## 2011-01-31 NOTE — Assessment & Plan Note (Signed)
Renal function has stabilized at a point were dialysis very unlikely  Refer back to [facility] to tie off fistula and prevent high output syndrome

## 2011-01-31 NOTE — Progress Notes (Signed)
The patient is 65 years old previously seen by Dr Olevia Perches Return for followup management of atheroemboli. He had a catheterization performed on 12/29/ 2010 which showed nonobstructive coronary disease but he later developed atheroemboli to his legs with progressive renal insufficiency. At catheterization his pulmonary wedge pressure was 23 with only had some diastolic heart failure as well.   Patient has fistula for HD in 07/2009, Patients Cr has been improving recently with latest Cr. 2.7 (highest recoreded Cr. was >4) he is being followed by renal for that.  Patient today is c/o of SOB that has been occuring since 05/2009, this is mostly exertional and related to eating large meals it lasts for 20-30 minutes after exertion, and is associated with pressure/burining like sensation in his chest which is relieved by Tums which he takes several times a day. He is having these symptoms almost eveyday and is not on a PPI, he states that he has been eating less due to this reflux symptoms and has lost weight due to this.  He c/o turncal and LE rash that he is seeing a dermatologist for, the rash started after he was hospitalized in 07/2009 for his fistula placement.  Has not had to use fistula.  Last Cr 01/30/11 was 1.7,.  Venous limb of fistula is aneurysmal.  Will refer back to Dr Donnetta Hutching to see if this should be tied off as it can cause Cardiac enlargement and high output.    Having palpitations.  ? PAF in primaries office on Monday.  In NSR now.  Will need event monitor if continues.  Would not put on coumadin at this time with history of atheroemboli.  Reviewed ECG;s from [place]: 9/10 Afib 93 otherwise normal 03/28/2006 NSR 66 normal ECG 02/23/1999 NSR 64 normal  ROS: Denies fever, malais, weight loss, blurry vision, decreased visual acuity, cough, sputum, SOB, hemoptysis, pleuritic pain, palpitaitons, heartburn, abdominal pain, melena, lower extremity edema, claudication, or rash.  All other systems reviewed  and negative   General: Affect appropriate Healthy:  appears stated age 65: normal Neck supple with no adenopathy JVP normal no bruits no thyromegaly Lungs clear with no wheezing and good diaphragmatic motion Heart:  S1/S2 no murmur,rub, gallop or click PMI normal Abdomen: benighn, BS positve, no tenderness, no AAA no bruit.  No HSM or HJR Distal pulses intact with no bruits No edema Neuro non-focal Skin warm and dry No muscular weakness  Medications Current Outpatient Prescriptions  Medication Sig Dispense Refill  . Acetaminophen (APAP) 325 MG tablet Take 650 mg by mouth as directed. Take 2       . amLODipine (NORVASC) 10 MG tablet Take 10 mg by mouth daily.        Marland Kitchen aspirin 81 MG tablet Take 81 mg by mouth every other day.        . B Complex-C-Folic Acid (NEPHRO-VITE PO) Take by mouth daily.        . furosemide (LASIX) 40 MG tablet Take 40 mg by mouth daily.        Marland Kitchen labetalol (NORMODYNE) 200 MG tablet Take 200 mg by mouth 3 (three) times daily.       . nitroGLYCERIN (NITROSTAT) 0.4 MG SL tablet Place 0.4 mg under the tongue every 5 (five) minutes as needed.        Marland Kitchen omeprazole (PRILOSEC) 20 MG capsule Take 20 mg by mouth daily.        . Pitavastatin Calcium (LIVALO) 1 MG TABS Take 1 tablet by mouth daily.        Marland Kitchen  promethazine (PHENERGAN) 12.5 MG tablet Take 12.5 mg by mouth as needed.        . Vitamin D, Ergocalciferol, (DRISDOL) 50000 UNITS CAPS Take 50,000 Units by mouth every 7 (seven) days.          Allergies Hydrochlorothiazide  Family History: Family History  Problem Relation Age of Onset  . Coronary artery disease      family hx    Social History: History   Social History  . Marital Status: Married    Spouse Name: N/A    Number of Children: N/A  . Years of Education: N/A   Occupational History  . Not on file.   Social History Main Topics  . Smoking status: Former Research scientist (life sciences)  . Smokeless tobacco: Not on file   Comment: quit 40+ years ago   .  Alcohol Use: Not on file  . Drug Use: Not on file  . Sexually Active: Not on file   Other Topics Concern  . Not on file   Social History Narrative   Married, lives with wife; Loss adjuster, chartered.     Electrocardiogram:  NSR 59  Normal ECG  Assessment and Plan

## 2011-01-31 NOTE — Assessment & Plan Note (Signed)
Event monitor if palpitations continue.  Consider Flecainide or antiarrythmic if episodes more than 2-3/xmonth.   Check echo for LA size and Ef

## 2011-01-31 NOTE — Assessment & Plan Note (Signed)
>>  ASSESSMENT AND PLAN FOR PAF (PAROXYSMAL ATRIAL FIBRILLATION) ([facility]) WRITTEN ON 01/31/2011 11:36 AM BY DELFORD MAUDE BROCKS, MD  Event monitor if palpitations continue.  Consider Flecainide  or antiarrythmic if episodes more than 2-3/xmonth.   Check echo for LA size and Ef

## 2011-01-31 NOTE — Assessment & Plan Note (Signed)
Well controlled.  Continue current medications and low sodium Dash type diet.    

## 2011-02-01 ENCOUNTER — Encounter: Payer: Self-pay | Admitting: Cardiovascular Disease

## 2011-02-01 ENCOUNTER — Ambulatory Visit (HOSPITAL_COMMUNITY): Payer: Managed Care, Other (non HMO) | Attending: Cardiovascular Disease | Admitting: Radiology

## 2011-02-01 DIAGNOSIS — I079 Rheumatic tricuspid valve disease, unspecified: Secondary | ICD-10-CM | POA: Insufficient documentation

## 2011-02-01 DIAGNOSIS — R072 Precordial pain: Secondary | ICD-10-CM

## 2011-02-01 DIAGNOSIS — I10 Essential (primary) hypertension: Secondary | ICD-10-CM | POA: Insufficient documentation

## 2011-02-01 DIAGNOSIS — I379 Nonrheumatic pulmonary valve disorder, unspecified: Secondary | ICD-10-CM | POA: Insufficient documentation

## 2011-02-01 DIAGNOSIS — I2510 Atherosclerotic heart disease of native coronary artery without angina pectoris: Secondary | ICD-10-CM | POA: Insufficient documentation

## 2011-02-01 DIAGNOSIS — R0602 Shortness of breath: Secondary | ICD-10-CM | POA: Insufficient documentation

## 2011-02-01 DIAGNOSIS — I4891 Unspecified atrial fibrillation: Secondary | ICD-10-CM | POA: Insufficient documentation

## 2011-02-02 ENCOUNTER — Telehealth: Payer: Self-pay | Admitting: Cardiovascular Disease

## 2011-02-02 ENCOUNTER — Other Ambulatory Visit: Payer: Self-pay

## 2011-02-02 DIAGNOSIS — I10 Essential (primary) hypertension: Secondary | ICD-10-CM

## 2011-02-02 DIAGNOSIS — I2510 Atherosclerotic heart disease of native coronary artery without angina pectoris: Secondary | ICD-10-CM

## 2011-02-02 DIAGNOSIS — T82898A Other specified complication of vascular prosthetic devices, implants and grafts, initial encounter: Secondary | ICD-10-CM

## 2011-02-02 MED ORDER — LABETALOL HCL 200 MG PO TABS: 200.0000 mg | ORAL_TABLET | Freq: Three times a day (TID) | ORAL | Status: DC

## 2011-02-02 MED ORDER — AMLODIPINE BESYLATE 10 MG PO TABS
10.0000 mg | ORAL_TABLET | Freq: Every day | ORAL | Status: DC
Start: 2011-02-02 — End: 2013-12-24

## 2011-02-02 NOTE — Telephone Encounter (Signed)
Pt's wife calling re echo results from yesterday

## 2011-02-02 NOTE — Telephone Encounter (Signed)
I spoke with the patient's wife. She is aware of his results. She was also requesting refills on amlodipine and labetolol. I explained that these had been sent on 9/12, but she states the pharmacy had not received these as of this morning. I will resend them now.

## 2011-02-05 ENCOUNTER — Encounter: Payer: Self-pay | Admitting: Vascular Surgery

## 2011-02-06 ENCOUNTER — Ambulatory Visit (INDEPENDENT_AMBULATORY_CARE_PROVIDER_SITE_OTHER): Payer: Managed Care, Other (non HMO) | Admitting: Vascular Surgery

## 2011-02-06 ENCOUNTER — Encounter: Payer: Self-pay | Admitting: Vascular Surgery

## 2011-02-06 VITALS — BP 149/77 | HR 63 | Temp 97.9°F | Ht 70.0 in | Wt 151.0 lb

## 2011-02-06 DIAGNOSIS — T82898A Other specified complication of vascular prosthetic devices, implants and grafts, initial encounter: Secondary | ICD-10-CM

## 2011-02-06 NOTE — Assessment & Plan Note (Signed)
OFFICE VISIT  NICOS, NICLEY DOB:  1946-04-29                                       02/06/2011 CHART#:[id number]  The patient presents today for evaluation of left upper arm AV fistula. This was created by myself in March of 2011.  He had had renal insufficiency which was felt to be secondary to atheroemboli following catheterization.  His creatinine was greater than 4 and it seemed that he was heading towards hemodialysis.  He has had good continued improvement of renal function since that time, most recent creatinine was 1.7.  He is seen today for evaluation of his left upper arm AV fistula.  He recently had some cardiac issues and was seen by Dr. Jenkins Rouge.  There was some concern that this may be causing high output failure.  PAST MEDICAL HISTORY:  His past history is otherwise unchanged with history of hypertension, elevated cholesterol.  He quit smoking in 1972.  PHYSICAL EXAM:  A well-developed, well-nourished white male appearing stated age in no acute distress.  He does have excellent maturation of his left upper arm cephalic vein fistula with some pulsatile flow.  He has no skin breakdown.  He did undergo venous duplex of this today in our office and this shows no evidence of stenosis throughout the fistula.  He has diameter ranging from 0.43 at the antecubital fossa up to nearly 1.5 cm over several areas of the fistula.  He does report some aching in the antecubital space occasionally.  I had a very long discussion with the patient and his wife present.  I do not feel that this is causing any new cardiac issues.  I did explain the option of ligation of his fistula.  My only concern would be that he does have an excellent fistula which would be good for access in all likelihood for years to come.  I explained that this would "burn a bridge" if we ligated the fistula.  He will discuss this further with Dr. Justin Mend and if he feels that his  likelihood of hemodialysis need is very low we would proceed with ligation of his fistula.  He will notify us if he wishes to proceed with this.  I would recommend resecting the antecubital portion of the fistula simply for a quicker resolution of the dilatation.    Rosetta Posner, M.D. Electronically Signed  TFE/MEDQ  D:  02/06/2011  T:  02/06/2011  Job:  [id number]  cc:   Wallis Bamberg. Johnsie Cancel, MD, [facility] Sherril Croon, M.D.

## 2011-02-06 NOTE — Progress Notes (Signed)
Lt AVF duplex performed @[facility]  02/06/2011-sah

## 2011-02-13 NOTE — Progress Notes (Signed)
Subjective:     Patient ID: Jack [name], male   DOB: 10-22-1945, 65 y.o.   MRN: [id number]  HPI Power outage, Notes were scanned into EPIC   Review of Systems     Objective:   Physical Exam     Assessment:         Plan:

## 2011-02-14 NOTE — Procedures (Unsigned)
VASCULAR LAB EXAM  INDICATION:  Aneurysmal dilatation of the venous outflow.  HISTORY: Diabetes:  No. Cardiac:  No. Hypertension:  Yes. Smoking:  Previously.  EXAM:  Left brachiocephalic arteriovenous fistula duplex.  IMPRESSION: 1. Patent left arteriovenous fistula with aneurysmal dilatation     present at the distal upper arm segment/anastomosis. 2. Elevated velocities present at the subclavian confluence with     diameter changes from 1.1 cm to 0.71 cm and a peak systolic     velocity of [id number] cm/s.  No hemodynamically significant branches are     identified. 3. Antegrade flow is noted in the arterial outflow.  ___________________________________________ Rosetta Posner, M.D.  SH/MEDQ  D:  02/06/2011  T:  02/06/2011  Job:  [id number]

## 2011-02-16 ENCOUNTER — Encounter: Payer: Self-pay | Admitting: Cardiovascular Disease

## 2011-03-06 ENCOUNTER — Encounter: Payer: Self-pay | Admitting: Cardiovascular Disease

## 2011-05-03 ENCOUNTER — Ambulatory Visit: Payer: Managed Care, Other (non HMO) | Admitting: Cardiovascular Disease

## 2012-01-09 ENCOUNTER — Other Ambulatory Visit: Payer: Self-pay | Admitting: Cardiovascular Disease

## 2012-09-15 HISTORY — PX: COLONOSCOPY: SHX174

## 2013-11-24 ENCOUNTER — Encounter: Payer: Managed Care, Other (non HMO) | Admitting: Vascular Surgery

## 2013-12-14 ENCOUNTER — Encounter: Payer: Self-pay | Admitting: Vascular Surgery

## 2013-12-15 ENCOUNTER — Encounter: Payer: Self-pay | Admitting: Vascular Surgery

## 2013-12-15 ENCOUNTER — Ambulatory Visit (INDEPENDENT_AMBULATORY_CARE_PROVIDER_SITE_OTHER): Payer: Medicare HMO | Admitting: Vascular Surgery

## 2013-12-15 ENCOUNTER — Other Ambulatory Visit: Payer: Self-pay

## 2013-12-15 VITALS — BP 136/75 | HR 62 | Ht 70.0 in | Wt 161.0 lb

## 2013-12-15 DIAGNOSIS — T82898A Other specified complication of vascular prosthetic devices, implants and grafts, initial encounter: Secondary | ICD-10-CM

## 2013-12-15 NOTE — Progress Notes (Signed)
Patient name: Jack [name] MRN: [id number] DOB: June 12, 1945 Sex: male   Referred by: Marcello Moores  Reason for referral:  Chief Complaint  Patient presents with  . New Evaluation    discuss possible removal of fistula    HISTORY OF PRESENT ILLNESS: Patient is a very pleasant 68 year old gentleman who is here today for evaluation of his enlarging left upper arm AV fistula. He is known to me from a placement of an AV fistula in 2011. He had had a complication after cardiac catheterization with probable embolization and worsening of renal function. Self that time that he is approaching need for hemodialysis access placed. Portion he is maintained a very stable kidney function since 2011. His creatinine runs approximately 1.6 and has for that many years with predicted 40-45% the renal clearance. I discussed this with him several years ago about removing the fistula explained that the only downside would be that he would not had it for access if he ever needed hemodialysis treatment. He has been discharged by Dr. Justin Mend his nephrologist and dissected to follow up with primary care. He reports that he does have discomfort associated with this. It is becoming larger and is in the antecubital space his burns him for him. He has some slight discomfort associated with it and reports it is difficult to lie on that side when he sleeps. He also has concern regarding laceration of this since he does ride motorcycles. I explained this would be quite unlikely but if he did have laceration would be significant blood loss.  Past Medical History  Diagnosis Date  . CAD (coronary artery disease)     hx non obst  . HTN (hypertension)   . HLD (hyperlipidemia)   . Atheroembolism   . Atrial fibrillation     Past Surgical History  Procedure Laterality Date  . Coronary angioplasty    . Av fistula placement  07-2009    Left Brachiocephalic AVF  . Skin cancer removed      multiple basal cell    History   Social  History  . Marital Status: Married    Spouse Name: N/A    Number of Children: N/A  . Years of Education: N/A   Occupational History  . Not on file.   Social History Main Topics  . Smoking status: Former Smoker    Quit date: 05/21/1970  . Smokeless tobacco: Not on file     Comment: quit 40+ years ago   . Alcohol Use: No  . Drug Use: No  . Sexual Activity: Not on file   Other Topics Concern  . Not on file   Social History Narrative   Married, lives with wife; Loss adjuster, chartered.     Family History  Problem Relation Age of Onset  . Coronary artery disease      family hx    Allergies as of 12/15/2013 - Review Complete 12/15/2013  Allergen Reaction Noted  . Hydrochlorothiazide  09/16/2009  . Hytrin [terazosin hcl]  02/06/2011  . Sulfa antibiotics  02/06/2011    Current Outpatient Prescriptions on File Prior to Visit  Medication Sig Dispense Refill  . Acetaminophen (APAP) 325 MG tablet Take 650 mg by mouth as directed. Take 2       . amLODipine (NORVASC) 10 MG tablet Take 1 tablet (10 mg total) by mouth daily.  90 tablet  3  . aspirin 81 MG tablet Take 81 mg by mouth every other day.        Marland Kitchen  furosemide (LASIX) 40 MG tablet Take 20 mg by mouth daily.       . nitroGLYCERIN (NITROSTAT) 0.4 MG SL tablet Place 0.4 mg under the tongue every 5 (five) minutes as needed.        . Vitamin D, Ergocalciferol, (DRISDOL) 50000 UNITS CAPS Take 1,000 Units by mouth daily.       . B Complex-C-Folic Acid (NEPHRO-VITE PO) Take by mouth daily.        Marland Kitchen omeprazole (PRILOSEC) 20 MG capsule Take 20 mg by mouth daily.        . Pitavastatin Calcium (LIVALO) 1 MG TABS Take 1 tablet by mouth daily.        . promethazine (PHENERGAN) 12.5 MG tablet Take 12.5 mg by mouth as needed.         No current facility-administered medications on file prior to visit.     REVIEW OF SYSTEMS:  Positives indicated with an "X"  CARDIOVASCULAR:  [ ]  chest pain   [ ]  chest pressure   [ ]  palpitations   [ ]   orthopnea   [ ]  dyspnea on exertion   [ ]  claudication   [ ]  rest pain   [ ]  DVT   [ ]  phlebitis PULMONARY:   [ ]  productive cough   [ ]  asthma   [ ]  wheezing NEUROLOGIC:   [ ]  weakness  [ ]  paresthesias  [ ]  aphasia  [ ]  amaurosis  [ ]  dizziness HEMATOLOGIC:   [ ]  bleeding problems   [ ]  clotting disorders MUSCULOSKELETAL:  [ ]  joint pain   [ ]  joint swelling GASTROINTESTINAL: [ ]   blood in stool  [ ]   hematemesis GENITOURINARY:  [ ]   dysuria  [ ]   hematuria PSYCHIATRIC:  [ ]  history of major depression INTEGUMENTARY:  [ ]  rashes  [ ]  ulcers CONSTITUTIONAL:  [ ]  fever   [ ]  chills  PHYSICAL EXAMINATION:  General: The patient is a well-nourished male, in no acute distress. Vital signs are BP 136/75  Pulse 62  Ht 5\' 10"  (1.778 m)  Wt 161 lb (73.029 kg)  BMI 23.10 kg/m2  SpO2 100% Pulmonary: There is a good air exchange  Musculoskeletal: There are no major deformities.  There is no significant extremity pain. Neurologic: No focal weakness or paresthesias are detected, Skin: There are no ulcer or rashes noted. Psychiatric: The patient has normal affect. Cardiovascular: 2+ left radial pulse. Very large fistula from the left antecubital space proximally. This is approximately 2-3 cm in diameter at the antecubital space and is very Corker is over his upper arm    Impression and Plan:  Very large mega fistula of left upper arm. Has never had hemodialysis. Has had for years of stable renal function. Again discussed the pros and cons of ligation and excision of this large fistula. Ex-preemie only downside of removal is that if he ever needed hemodialysis he would not have this for access. He is comfortable with the decision to excise this due to the markedly large size and increasing discomfort and functional impairment related to the large fistula at the antecubital space. He has his 40th wedding anniversary on August 8 and wishes to proceed following this. We have scheduled him for  outpatient ligation and excision of aneurysmal fistula on 12/30/2013.    EARLY, TODD [facility] [place] Office: [phone]

## 2013-12-17 ENCOUNTER — Encounter (HOSPITAL_COMMUNITY): Payer: Self-pay | Admitting: Pharmacy Technician

## 2013-12-24 ENCOUNTER — Encounter (HOSPITAL_COMMUNITY)
Admission: RE | Admit: 2013-12-24 | Discharge: 2013-12-24 | Disposition: A | Discharge status: Home / Self Care | Payer: Medicare HMO | Source: Ambulatory Visit | Attending: Vascular Surgery | Admitting: Vascular Surgery

## 2013-12-24 ENCOUNTER — Encounter (HOSPITAL_COMMUNITY): Payer: Self-pay

## 2013-12-24 DIAGNOSIS — Z01818 Encounter for other preprocedural examination: Secondary | ICD-10-CM | POA: Insufficient documentation

## 2013-12-24 DIAGNOSIS — Z01812 Encounter for preprocedural laboratory examination: Secondary | ICD-10-CM | POA: Diagnosis not present

## 2013-12-24 DIAGNOSIS — Z01810 Encounter for preprocedural cardiovascular examination: Secondary | ICD-10-CM | POA: Diagnosis not present

## 2013-12-24 DIAGNOSIS — I10 Essential (primary) hypertension: Secondary | ICD-10-CM | POA: Insufficient documentation

## 2013-12-24 HISTORY — DX: Chronic kidney disease, unspecified: N18.9

## 2013-12-24 HISTORY — DX: Malignant (primary) neoplasm, unspecified: C80.1

## 2013-12-24 HISTORY — DX: Unspecified osteoarthritis, unspecified site: M19.90

## 2013-12-24 HISTORY — DX: Other complications of anesthesia, initial encounter: T88.59XA

## 2013-12-24 HISTORY — DX: Other specified postprocedural states: Z98.890

## 2013-12-24 HISTORY — DX: Shortness of breath: R06.02

## 2013-12-24 HISTORY — DX: Adverse effect of unspecified anesthetic, initial encounter: T41.45XA

## 2013-12-24 HISTORY — DX: Nausea with vomiting, unspecified: R11.2

## 2013-12-24 LAB — CBC
HCT: 45.6 % (ref 39.0–52.0)
Hemoglobin: 15.5 g/dL (ref 13.0–17.0)
MCH: 30.2 pg (ref 26.0–34.0)
MCHC: 34 g/dL (ref 30.0–36.0)
MCV: 88.9 fL (ref 78.0–100.0)
Platelets: 225 10*3/uL (ref 150–400)
RBC: 5.13 MIL/uL (ref 4.22–5.81)
RDW: 14.1 % (ref 11.5–15.5)
WBC: 8.4 10*3/uL (ref 4.0–10.5)

## 2013-12-24 LAB — BASIC METABOLIC PANEL
Anion gap: 13 (ref 5–15)
BUN: 27 mg/dL — ABNORMAL HIGH (ref 6–23)
CO2: 26 mEq/L (ref 19–32)
Calcium: 9.2 mg/dL (ref 8.4–10.5)
Chloride: 103 mEq/L (ref 96–112)
Creatinine, Ser: 1.45 mg/dL — ABNORMAL HIGH (ref 0.50–1.35)
GFR calc Af Amer: 56 mL/min — ABNORMAL LOW (ref >90)
GFR, EST NON AFRICAN AMERICAN: 48 mL/min — AB (ref >90)
Glucose, Bld: 87 mg/dL (ref 70–99)
Potassium: 4.5 mEq/L (ref 3.7–5.3)
SODIUM: 142 meq/L (ref 137–147)

## 2013-12-24 NOTE — Pre-Procedure Instructions (Signed)
BIRCH SCHETTER  12/24/2013   Your procedure is scheduled on:  8/12/2015[facility]  Report to [facility] Admitting    ENTRANCE A at  8:00 AM.  Call this number if you have problems the morning of surgery: [phone]   Remember:   Do not eat food or drink liquids after midnight  on TUESDAY.   Take these medicines the morning of surgery with A SIP OF WATER: Labetolol & Amlodipine    Do not wear jewelry   Do not wear lotions, powders, or perfumes. You may wear deodorant.    Men may shave face and neck.   Do not bring valuables to the hospital.  [facility] is not responsible   for any belongings or valuables.               Contacts, dentures or bridgework may not be worn into surgery.  Leave suitcase in the car. After surgery it may be brought to your room.  For patients admitted to the hospital, discharge time is determined by your                treatment team.               Patients discharged the day of surgery will not be allowed to drive  home.  Name and phone number of your driver: /w family  Special Instructions: Special Instructions: [facility] - Preparing for Surgery  Before surgery, you can play an important role.  Because skin is not sterile, your skin needs to be as free of germs as possible.  You can reduce the number of germs on you skin by washing with CHG (chlorahexidine gluconate) soap before surgery.  CHG is an antiseptic cleaner which kills germs and bonds with the skin to continue killing germs even after washing.  Please DO NOT use if you have an allergy to CHG or antibacterial soaps.  If your skin becomes reddened/irritated stop using the CHG and inform your nurse when you arrive at Short Stay.  Do not shave (including legs and underarms) for at least 48 hours prior to the first CHG shower.  You may shave your face.  Please follow these instructions carefully:   1.  Shower with CHG Soap the night before surgery and the  morning of Surgery.  2.   If you choose to wash your hair, wash your hair first as usual with your  normal shampoo.  3.  After you shampoo, rinse your hair and body thoroughly to remove the  Shampoo.  4.  Use CHG as you would any other liquid soap.  You can apply chg directly to the skin and wash gently with scrungie or a clean washcloth.  5.  Apply the CHG Soap to your body ONLY FROM THE NECK DOWN.    Do not use on open wounds or open sores.  Avoid contact with your eyes, ears, mouth and genitals (private parts).  Wash genitals (private parts)   with your normal soap.  6.  Wash thoroughly, paying special attention to the area where your surgery will be performed.  7.  Thoroughly rinse your body with warm water from the neck down.  8.  DO NOT shower/wash with your normal soap after using and rinsing off   the CHG Soap.  9.  Pat yourself dry with a clean towel.            10.  Wear clean pajamas.  11.  Place clean sheets on your bed the night of your first shower and do not sleep with pets.  Day of Surgery  Do not apply any lotions/deodorants the morning of surgery.  Please wear clean clothes to the hospital/surgery center.   Please read over the following fact sheets that you were given: Pain Booklet, Coughing and Deep Breathing and Surgical Site Infection Prevention

## 2013-12-29 MED ORDER — DEXTROSE 5 % IV SOLN
1.5000 g | INTRAVENOUS | Status: AC
  Administered 2013-12-30: 1.5 g via INTRAVENOUS
  Filled 2013-12-29: qty 1.5

## 2013-12-29 MED ORDER — SODIUM CHLORIDE 0.9 % IV SOLN: INTRAVENOUS | Status: DC

## 2013-12-30 ENCOUNTER — Ambulatory Visit (HOSPITAL_COMMUNITY): Payer: Medicare HMO | Admitting: Critical Care Medicine

## 2013-12-30 ENCOUNTER — Ambulatory Visit (HOSPITAL_COMMUNITY)
Admission: RE | Admit: 2013-12-30 | Discharge: 2013-12-30 | Disposition: A | Discharge status: Home / Self Care | Payer: Medicare HMO | Source: Ambulatory Visit | Attending: Vascular Surgery | Admitting: Vascular Surgery

## 2013-12-30 ENCOUNTER — Encounter (HOSPITAL_COMMUNITY)
Admission: RE | Disposition: A | Discharge status: Home / Self Care | Payer: Self-pay | Source: Ambulatory Visit | Attending: Vascular Surgery

## 2013-12-30 ENCOUNTER — Encounter (HOSPITAL_COMMUNITY): Payer: Medicare HMO | Admitting: Critical Care Medicine

## 2013-12-30 ENCOUNTER — Telehealth: Payer: Self-pay | Admitting: Vascular Surgery

## 2013-12-30 ENCOUNTER — Encounter (HOSPITAL_COMMUNITY): Payer: Self-pay | Admitting: Critical Care Medicine

## 2013-12-30 DIAGNOSIS — Z882 Allergy status to sulfonamides status: Secondary | ICD-10-CM | POA: Diagnosis not present

## 2013-12-30 DIAGNOSIS — Z888 Allergy status to other drugs, medicaments and biological substances status: Secondary | ICD-10-CM | POA: Insufficient documentation

## 2013-12-30 DIAGNOSIS — I129 Hypertensive chronic kidney disease with stage 1 through stage 4 chronic kidney disease, or unspecified chronic kidney disease: Secondary | ICD-10-CM | POA: Diagnosis not present

## 2013-12-30 DIAGNOSIS — T82898A Other specified complication of vascular prosthetic devices, implants and grafts, initial encounter: Secondary | ICD-10-CM | POA: Diagnosis not present

## 2013-12-30 DIAGNOSIS — N189 Chronic kidney disease, unspecified: Secondary | ICD-10-CM | POA: Insufficient documentation

## 2013-12-30 DIAGNOSIS — Z87891 Personal history of nicotine dependence: Secondary | ICD-10-CM | POA: Insufficient documentation

## 2013-12-30 DIAGNOSIS — I770 Arteriovenous fistula, acquired: Secondary | ICD-10-CM | POA: Diagnosis not present

## 2013-12-30 DIAGNOSIS — I4891 Unspecified atrial fibrillation: Secondary | ICD-10-CM | POA: Insufficient documentation

## 2013-12-30 DIAGNOSIS — Z79899 Other long term (current) drug therapy: Secondary | ICD-10-CM | POA: Insufficient documentation

## 2013-12-30 DIAGNOSIS — Y832 Surgical operation with anastomosis, bypass or graft as the cause of abnormal reaction of the patient, or of later complication, without mention of misadventure at the time of the procedure: Secondary | ICD-10-CM | POA: Diagnosis not present

## 2013-12-30 DIAGNOSIS — Z7982 Long term (current) use of aspirin: Secondary | ICD-10-CM | POA: Insufficient documentation

## 2013-12-30 DIAGNOSIS — Z85828 Personal history of other malignant neoplasm of skin: Secondary | ICD-10-CM | POA: Insufficient documentation

## 2013-12-30 DIAGNOSIS — I2510 Atherosclerotic heart disease of native coronary artery without angina pectoris: Secondary | ICD-10-CM | POA: Diagnosis not present

## 2013-12-30 DIAGNOSIS — E785 Hyperlipidemia, unspecified: Secondary | ICD-10-CM | POA: Diagnosis not present

## 2013-12-30 HISTORY — PX: LIGATION OF ARTERIOVENOUS  FISTULA: SHX5948

## 2013-12-30 SURGERY — LIGATION OF ARTERIOVENOUS  FISTULA
Anesthesia: General | Site: Arm Upper | Laterality: Left

## 2013-12-30 MED ORDER — CHLORHEXIDINE GLUCONATE CLOTH 2 % EX PADS: 6.0000 | MEDICATED_PAD | Freq: Once | CUTANEOUS | Status: DC

## 2013-12-30 MED ORDER — OXYCODONE HCL 5 MG PO TABS
5.0000 mg | ORAL_TABLET | Freq: Once | ORAL | Status: AC | PRN
  Administered 2013-12-30: 5 mg via ORAL

## 2013-12-30 MED ORDER — HYDROMORPHONE HCL PF 1 MG/ML IJ SOLN
0.2500 mg | INTRAMUSCULAR | Status: DC | PRN
  Administered 2013-12-30 (×3): 0.5 mg via INTRAVENOUS

## 2013-12-30 MED ORDER — SODIUM CHLORIDE 0.9 % IR SOLN
Status: DC | PRN
  Administered 2013-12-30: 11:00:00

## 2013-12-30 MED ORDER — DEXAMETHASONE SODIUM PHOSPHATE 4 MG/ML IJ SOLN
INTRAMUSCULAR | Status: AC
  Filled 2013-12-30: qty 1

## 2013-12-30 MED ORDER — ONDANSETRON HCL 4 MG/2ML IJ SOLN
INTRAMUSCULAR | Status: DC | PRN
  Administered 2013-12-30: 4 mg via INTRAVENOUS

## 2013-12-30 MED ORDER — EPHEDRINE SULFATE 50 MG/ML IJ SOLN
INTRAMUSCULAR | Status: AC
  Filled 2013-12-30: qty 1

## 2013-12-30 MED ORDER — EPHEDRINE SULFATE 50 MG/ML IJ SOLN
INTRAMUSCULAR | Status: DC | PRN
  Administered 2013-12-30 (×2): 5 mg via INTRAVENOUS
  Administered 2013-12-30: 10 mg via INTRAVENOUS

## 2013-12-30 MED ORDER — SUCCINYLCHOLINE CHLORIDE 20 MG/ML IJ SOLN
INTRAMUSCULAR | Status: AC
  Filled 2013-12-30: qty 1

## 2013-12-30 MED ORDER — ONDANSETRON HCL 4 MG/2ML IJ SOLN
INTRAMUSCULAR | Status: AC
Start: 2013-12-30 — End: 2013-12-30
  Filled 2013-12-30: qty 2

## 2013-12-30 MED ORDER — ONDANSETRON HCL 4 MG/2ML IJ SOLN
INTRAMUSCULAR | Status: AC
  Filled 2013-12-30: qty 2

## 2013-12-30 MED ORDER — MEPERIDINE HCL 25 MG/ML IJ SOLN: 6.2500 mg | INTRAMUSCULAR | Status: DC | PRN

## 2013-12-30 MED ORDER — ONDANSETRON HCL 4 MG/2ML IJ SOLN
4.0000 mg | Freq: Once | INTRAMUSCULAR | Status: AC | PRN
  Administered 2013-12-30: 4 mg via INTRAVENOUS

## 2013-12-30 MED ORDER — SODIUM CHLORIDE 0.9 % IJ SOLN
INTRAMUSCULAR | Status: AC
  Filled 2013-12-30: qty 10

## 2013-12-30 MED ORDER — OXYCODONE HCL 5 MG/5ML PO SOLN: 5.0000 mg | Freq: Once | ORAL | Status: AC | PRN

## 2013-12-30 MED ORDER — DEXAMETHASONE SODIUM PHOSPHATE 4 MG/ML IJ SOLN
INTRAMUSCULAR | Status: DC | PRN
  Administered 2013-12-30: 4 mg via INTRAVENOUS

## 2013-12-30 MED ORDER — PROPOFOL 10 MG/ML IV BOLUS
INTRAVENOUS | Status: AC
  Filled 2013-12-30: qty 20

## 2013-12-30 MED ORDER — PROMETHAZINE HCL 25 MG/ML IJ SOLN
6.2500 mg | Freq: Once | INTRAMUSCULAR | Status: AC
  Administered 2013-12-30: 6.25 mg via INTRAVENOUS

## 2013-12-30 MED ORDER — PROPOFOL 10 MG/ML IV BOLUS
INTRAVENOUS | Status: DC | PRN
  Administered 2013-12-30: 50 mg via INTRAVENOUS
  Administered 2013-12-30: 150 mg via INTRAVENOUS

## 2013-12-30 MED ORDER — LIDOCAINE HCL (CARDIAC) 10 MG/ML IV SOLN
INTRAVENOUS | Status: DC | PRN
  Administered 2013-12-30: 100 mg via INTRAVENOUS

## 2013-12-30 MED ORDER — LIDOCAINE HCL (CARDIAC) 20 MG/ML IV SOLN
INTRAVENOUS | Status: AC
  Filled 2013-12-30: qty 5

## 2013-12-30 MED ORDER — MIDAZOLAM HCL 5 MG/5ML IJ SOLN
INTRAMUSCULAR | Status: DC | PRN
  Administered 2013-12-30: 2 mg via INTRAVENOUS

## 2013-12-30 MED ORDER — PROMETHAZINE HCL 25 MG/ML IJ SOLN
INTRAMUSCULAR | Status: AC
  Filled 2013-12-30: qty 1

## 2013-12-30 MED ORDER — MIDAZOLAM HCL 2 MG/2ML IJ SOLN
INTRAMUSCULAR | Status: AC
  Filled 2013-12-30: qty 2

## 2013-12-30 MED ORDER — LIDOCAINE-EPINEPHRINE 0.5 %-1:200000 IJ SOLN
INTRAMUSCULAR | Status: DC | PRN
  Administered 2013-12-30: 10 mL

## 2013-12-30 MED ORDER — OXYCODONE HCL 5 MG PO TABS
ORAL_TABLET | ORAL | Status: AC
  Administered 2013-12-30: 5 mg via ORAL
  Filled 2013-12-30: qty 1

## 2013-12-30 MED ORDER — FENTANYL CITRATE 0.05 MG/ML IJ SOLN
INTRAMUSCULAR | Status: DC | PRN
  Administered 2013-12-30: 25 ug via INTRAVENOUS
  Administered 2013-12-30: 50 ug via INTRAVENOUS
  Administered 2013-12-30: 25 ug via INTRAVENOUS

## 2013-12-30 MED ORDER — OXYCODONE HCL 5 MG PO TABS: 5.0000 mg | ORAL_TABLET | Freq: Four times a day (QID) | ORAL | Status: DC | PRN

## 2013-12-30 MED ORDER — HYDROMORPHONE HCL PF 1 MG/ML IJ SOLN
INTRAMUSCULAR | Status: AC
  Administered 2013-12-30: 0.5 mg via INTRAVENOUS
  Filled 2013-12-30: qty 1

## 2013-12-30 MED ORDER — ROCURONIUM BROMIDE 50 MG/5ML IV SOLN
INTRAVENOUS | Status: AC
  Filled 2013-12-30: qty 1

## 2013-12-30 MED ORDER — LIDOCAINE-EPINEPHRINE 0.5 %-1:200000 IJ SOLN
INTRAMUSCULAR | Status: AC
  Filled 2013-12-30: qty 1

## 2013-12-30 MED ORDER — ONDANSETRON HCL 4 MG/2ML IJ SOLN
INTRAMUSCULAR | Status: AC
  Administered 2013-12-30: 4 mg via INTRAVENOUS
  Filled 2013-12-30: qty 2

## 2013-12-30 MED ORDER — 0.9 % SODIUM CHLORIDE (POUR BTL) OPTIME
TOPICAL | Status: DC | PRN
  Administered 2013-12-30: 1000 mL

## 2013-12-30 MED ORDER — SODIUM CHLORIDE 0.9 % IV SOLN
INTRAVENOUS | Status: DC
Start: 2013-12-30 — End: 2013-12-30
  Administered 2013-12-30 (×2): via INTRAVENOUS

## 2013-12-30 MED ORDER — FENTANYL CITRATE 0.05 MG/ML IJ SOLN
INTRAMUSCULAR | Status: AC
  Filled 2013-12-30: qty 5

## 2013-12-30 MED ORDER — HYDROMORPHONE HCL PF 1 MG/ML IJ SOLN
INTRAMUSCULAR | Status: AC
  Filled 2013-12-30: qty 1

## 2013-12-30 MED ORDER — TRAMADOL HCL 50 MG PO TABS: 50.0000 mg | ORAL_TABLET | Freq: Four times a day (QID) | ORAL | Status: DC | PRN

## 2013-12-30 MED ORDER — ARTIFICIAL TEARS OP OINT
TOPICAL_OINTMENT | OPHTHALMIC | Status: AC
  Filled 2013-12-30: qty 3.5

## 2013-12-30 SURGICAL SUPPLY — 42 items
BANDAGE ELASTIC 4 VELCRO ST LF (GAUZE/BANDAGES/DRESSINGS) ×3 IMPLANT
BENZOIN TINCTURE PRP APPL 2/3 (GAUZE/BANDAGES/DRESSINGS) ×3 IMPLANT
BLADE 10 SAFETY STRL DISP (BLADE) IMPLANT
BNDG GAUZE ELAST 4 BULKY (GAUZE/BANDAGES/DRESSINGS) ×3 IMPLANT
CANISTER SUCTION 2500CC (MISCELLANEOUS) ×3 IMPLANT
CLIP LIGATING EXTRA MED SLVR (CLIP) ×3 IMPLANT
CLIP LIGATING EXTRA SM BLUE (MISCELLANEOUS) ×3 IMPLANT
CLOSURE WOUND 1/2 X4 (GAUZE/BANDAGES/DRESSINGS) ×1
COVER SURGICAL LIGHT HANDLE (MISCELLANEOUS) ×3 IMPLANT
DECANTER SPIKE VIAL GLASS SM (MISCELLANEOUS) ×3 IMPLANT
ELECT REM PT RETURN 9FT ADLT (ELECTROSURGICAL) ×3
ELECTRODE REM PT RTRN 9FT ADLT (ELECTROSURGICAL) ×1 IMPLANT
GAUZE SPONGE 4X4 12PLY STRL (GAUZE/BANDAGES/DRESSINGS) ×3 IMPLANT
GEL ULTRASOUND 20GR AQUASONIC (MISCELLANEOUS) IMPLANT
GLOVE BIO SURGEON STRL SZ 6.5 (GLOVE) ×6 IMPLANT
GLOVE BIO SURGEONS STRL SZ 6.5 (GLOVE) ×3
GLOVE BIOGEL PI IND STRL 6.5 (GLOVE) ×1 IMPLANT
GLOVE BIOGEL PI INDICATOR 6.5 (GLOVE) ×2
GLOVE SS BIOGEL STRL SZ 7.5 (GLOVE) ×1 IMPLANT
GLOVE SUPERSENSE BIOGEL SZ 7.5 (GLOVE) ×2
GOWN STRL REUS W/ TWL LRG LVL3 (GOWN DISPOSABLE) ×3 IMPLANT
GOWN STRL REUS W/TWL LRG LVL3 (GOWN DISPOSABLE) ×6
KIT BASIN OR (CUSTOM PROCEDURE TRAY) ×3 IMPLANT
KIT ROOM TURNOVER OR (KITS) ×3 IMPLANT
NS IRRIG 1000ML POUR BTL (IV SOLUTION) ×3 IMPLANT
PACK CV ACCESS (CUSTOM PROCEDURE TRAY) ×3 IMPLANT
PAD ARMBOARD 7.5X6 YLW CONV (MISCELLANEOUS) ×6 IMPLANT
SPONGE GAUZE 4X4 12PLY STER LF (GAUZE/BANDAGES/DRESSINGS) ×3 IMPLANT
STAPLER VISISTAT 35W (STAPLE) IMPLANT
STRIP CLOSURE SKIN 1/2X4 (GAUZE/BANDAGES/DRESSINGS) ×2 IMPLANT
SUT ETHILON 3 0 PS 1 (SUTURE) IMPLANT
SUT PROLENE 5 0 C 1 24 (SUTURE) ×3 IMPLANT
SUT PROLENE 6 0 CC (SUTURE) IMPLANT
SUT SILK 0 TIES 10X30 (SUTURE) ×3 IMPLANT
SUT VIC AB 3-0 SH 27 (SUTURE) ×4
SUT VIC AB 3-0 SH 27X BRD (SUTURE) ×2 IMPLANT
SUT VICRYL 4-0 PS2 18IN ABS (SUTURE) ×3 IMPLANT
TAPE CLOTH SURG 4X10 WHT LF (GAUZE/BANDAGES/DRESSINGS) ×3 IMPLANT
TOWEL OR 17X24 6PK STRL BLUE (TOWEL DISPOSABLE) ×3 IMPLANT
TOWEL OR 17X26 10 PK STRL BLUE (TOWEL DISPOSABLE) ×3 IMPLANT
UNDERPAD 30X30 INCONTINENT (UNDERPADS AND DIAPERS) ×3 IMPLANT
WATER STERILE IRR 1000ML POUR (IV SOLUTION) ×3 IMPLANT

## 2013-12-30 NOTE — H&P (View-Only) (Signed)
Patient name: Jack [name] MRN: [id number] DOB: 1945/09/08 Sex: male   Referred by: Marcello Moores  Reason for referral:  Chief Complaint  Patient presents with  . New Evaluation    discuss possible removal of fistula    HISTORY OF PRESENT ILLNESS: Patient is a very pleasant 68 year old gentleman who is here today for evaluation of his enlarging left upper arm AV fistula. He is known to me from a placement of an AV fistula in 2011. He had had a complication after cardiac catheterization with probable embolization and worsening of renal function. Self that time that he is approaching need for hemodialysis access placed. Portion he is maintained a very stable kidney function since 2011. His creatinine runs approximately 1.6 and has for that many years with predicted 40-45% the renal clearance. I discussed this with him several years ago about removing the fistula explained that the only downside would be that he would not had it for access if he ever needed hemodialysis treatment. He has been discharged by Dr. Justin Mend his nephrologist and dissected to follow up with primary care. He reports that he does have discomfort associated with this. It is becoming larger and is in the antecubital space his burns him for him. He has some slight discomfort associated with it and reports it is difficult to lie on that side when he sleeps. He also has concern regarding laceration of this since he does ride motorcycles. I explained this would be quite unlikely but if he did have laceration would be significant blood loss.  Past Medical History  Diagnosis Date  . CAD (coronary artery disease)     hx non obst  . HTN (hypertension)   . HLD (hyperlipidemia)   . Atheroembolism   . Atrial fibrillation     Past Surgical History  Procedure Laterality Date  . Coronary angioplasty    . Av fistula placement  07-2009    Left Brachiocephalic AVF  . Skin cancer removed      multiple basal cell    History   Social  History  . Marital Status: Married    Spouse Name: N/A    Number of Children: N/A  . Years of Education: N/A   Occupational History  . Not on file.   Social History Main Topics  . Smoking status: Former Smoker    Quit date: 05/21/1970  . Smokeless tobacco: Not on file     Comment: quit 40+ years ago   . Alcohol Use: No  . Drug Use: No  . Sexual Activity: Not on file   Other Topics Concern  . Not on file   Social History Narrative   Married, lives with wife; Loss adjuster, chartered.     Family History  Problem Relation Age of Onset  . Coronary artery disease      family hx    Allergies as of 12/15/2013 - Review Complete 12/15/2013  Allergen Reaction Noted  . Hydrochlorothiazide  09/16/2009  . Hytrin [terazosin hcl]  02/06/2011  . Sulfa antibiotics  02/06/2011    Current Outpatient Prescriptions on File Prior to Visit  Medication Sig Dispense Refill  . Acetaminophen (APAP) 325 MG tablet Take 650 mg by mouth as directed. Take 2       . amLODipine (NORVASC) 10 MG tablet Take 1 tablet (10 mg total) by mouth daily.  90 tablet  3  . aspirin 81 MG tablet Take 81 mg by mouth every other day.        Marland Kitchen  furosemide (LASIX) 40 MG tablet Take 20 mg by mouth daily.       . nitroGLYCERIN (NITROSTAT) 0.4 MG SL tablet Place 0.4 mg under the tongue every 5 (five) minutes as needed.        . Vitamin D, Ergocalciferol, (DRISDOL) 50000 UNITS CAPS Take 1,000 Units by mouth daily.       . B Complex-C-Folic Acid (NEPHRO-VITE PO) Take by mouth daily.        Marland Kitchen omeprazole (PRILOSEC) 20 MG capsule Take 20 mg by mouth daily.        . Pitavastatin Calcium (LIVALO) 1 MG TABS Take 1 tablet by mouth daily.        . promethazine (PHENERGAN) 12.5 MG tablet Take 12.5 mg by mouth as needed.         No current facility-administered medications on file prior to visit.     REVIEW OF SYSTEMS:  Positives indicated with an "X"  CARDIOVASCULAR:  [ ]  chest pain   [ ]  chest pressure   [ ]  palpitations   [ ]   orthopnea   [ ]  dyspnea on exertion   [ ]  claudication   [ ]  rest pain   [ ]  DVT   [ ]  phlebitis PULMONARY:   [ ]  productive cough   [ ]  asthma   [ ]  wheezing NEUROLOGIC:   [ ]  weakness  [ ]  paresthesias  [ ]  aphasia  [ ]  amaurosis  [ ]  dizziness HEMATOLOGIC:   [ ]  bleeding problems   [ ]  clotting disorders MUSCULOSKELETAL:  [ ]  joint pain   [ ]  joint swelling GASTROINTESTINAL: [ ]   blood in stool  [ ]   hematemesis GENITOURINARY:  [ ]   dysuria  [ ]   hematuria PSYCHIATRIC:  [ ]  history of major depression INTEGUMENTARY:  [ ]  rashes  [ ]  ulcers CONSTITUTIONAL:  [ ]  fever   [ ]  chills  PHYSICAL EXAMINATION:  General: The patient is a well-nourished male, in no acute distress. Vital signs are BP 136/75  Pulse 62  Ht 5\' 10"  (1.778 m)  Wt 161 lb (73.029 kg)  BMI 23.10 kg/m2  SpO2 100% Pulmonary: There is a good air exchange  Musculoskeletal: There are no major deformities.  There is no significant extremity pain. Neurologic: No focal weakness or paresthesias are detected, Skin: There are no ulcer or rashes noted. Psychiatric: The patient has normal affect. Cardiovascular: 2+ left radial pulse. Very large fistula from the left antecubital space proximally. This is approximately 2-3 cm in diameter at the antecubital space and is very Corker is over his upper arm    Impression and Plan:  Very large mega fistula of left upper arm. Has never had hemodialysis. Has had for years of stable renal function. Again discussed the pros and cons of ligation and excision of this large fistula. Ex-preemie only downside of removal is that if he ever needed hemodialysis he would not have this for access. He is comfortable with the decision to excise this due to the markedly large size and increasing discomfort and functional impairment related to the large fistula at the antecubital space. He has his 40th wedding anniversary on August 8 and wishes to proceed following this. We have scheduled him for  outpatient ligation and excision of aneurysmal fistula on 12/30/2013.    Abie Cheek [facility] [place] Office: [phone]

## 2013-12-30 NOTE — Interval H&P Note (Signed)
History and Physical Interval Note:  12/30/2013 9:02 AM  Jack [name]  has presented today for surgery, with the diagnosis of 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, implant, and graft   The various methods of treatment have been discussed with the patient and family. After consideration of risks, benefits and other options for treatment, the patient has consented to  Procedure(s): EXCISION OF ARTERIOVENOUS  FISTULA (Left) as a surgical intervention .  The patient's history has been reviewed, patient examined, no change in status, stable for surgery.  I have reviewed the patient's chart and labs.  Questions were answered to the patient's satisfaction.     Devita Nies

## 2013-12-30 NOTE — Op Note (Signed)
    OPERATIVE REPORT  DATE OF SURGERY: 12/30/2013  PATIENT: Jack [name], 68 y.o. male MRN: [id number]  DOB: May 02, 1946  PRE-OPERATIVE DIAGNOSIS: Large venous aneurysms of left upper arm AV fistula  POST-OPERATIVE DIAGNOSIS:  Same  PROCEDURE: Ligation of left upper arm AV fistula and resection of venous aneurysms  SURGEON:  Curt Jews, M.D.  PHYSICIAN ASSISTANT: Rhyne  ANESTHESIA:  Gen.  EBL: Minimal ml  Total I/O In: 600 [I.V.:600] Out: 50 [Blood:50]  BLOOD ADMINISTERED: None  DRAINS: None  SPECIMEN: None  COUNTS CORRECT:  YES  PLAN OF CARE: PACU   PATIENT DISPOSITION:  PACU - hemodynamically stable  PROCEDURE DETAILS: The patient was taken to the upper and placed to the left sterile fashion. A long-standing left upper arm AV fistula which had become aneurysmal throughout its course. This was uncomfortable and was causing issues regarding a pressure in his forearm antecubital space. Incision was made over the prior brachial to cephalic anastomosis was carried down to isolate the anastomosis. There was a wide anastomosis and the dilator the brachial artery. The vein was encircled and ligated with 2-0 silk tie for initial control. The large venous aneurysms were mobilized and dissected free through this incision. A separate incision was in the mid upper arm. The vein was dissected up towards the shoulder and a subcutaneous tunnel and was ligated with 2-0 silk ties and divided. The vein was removed in its entirety from this placed near the shoulder down to the antecubital anastomosis. The brachial artery then was occluded proximal and distal to the old anastomosis and the vein was excised from the brachial artery leaving a small cuff. The vein cuff was closed with 5-0 Prolene in 2 layers. Clamps removed and good anastomosis was encountered. The wound irrigated with saline. Incision with cautery. Wounds were closed with 3-0 Vicryl in the subcutaneous and subcuticular tissue.  Benzoin Steri-Strips were applied. Kerlix and Ace wrap were applied the patient was taken to the recovery room in stable condition    Curt Jews, M.D. 12/30/2013 4:09 PM

## 2013-12-30 NOTE — Telephone Encounter (Addendum)
Message copied by Doristine Section on Wed Dec 30, 2013  3:00 PM ------      Message from: [place], [place] K      Created: Wed Dec 30, 2013 12:11 PM      Regarding: Schedule                   ----- Message -----         From: Gabriel Earing, PA-C         Sent: 12/30/2013  11:19 AM           To: [facility] Charge Pool            S/p removal of LUA AVF 12/30/13.  F/u with Dr. Donnetta Hutching in 2 weeks.            Thanks,      Aldona Bar ------  notified patient of post op appt. with tfe on 01-12-14 12:15

## 2013-12-30 NOTE — Anesthesia Postprocedure Evaluation (Signed)
Anesthesia Post Note  Patient: Jack [name]  Procedure(s) Performed: Procedure(s) (LRB): EXCISION OF LEFT ARM ARTERIOVENOUS  FISTULA (Left)  Anesthesia type: general  Patient location: PACU  Post pain: Pain level controlled  Post assessment: Patient's Cardiovascular Status Stable  Last Vitals:  Filed Vitals:   12/30/13 1432  BP: 138/75  Pulse: 63  Temp: 36.7 C  Resp:     Post vital signs: Reviewed and stable  Level of consciousness: sedated  Complications: No apparent anesthesia complications

## 2013-12-30 NOTE — Transfer of Care (Signed)
Immediate Anesthesia Transfer of Care Note  Patient: Jack [name]  Procedure(s) Performed: Procedure(s): EXCISION OF LEFT ARM ARTERIOVENOUS  FISTULA (Left)  Patient Location: PACU  Anesthesia Type:General  Level of Consciousness: awake, alert  and oriented  Airway & Oxygen Therapy: Patient Spontanous Breathing and Patient connected to nasal cannula oxygen  Post-op Assessment: Report given to PACU RN, Post -op Vital signs reviewed and stable and Patient moving all extremities X 4  Post vital signs: Reviewed and stable  Complications: No apparent anesthesia complications

## 2013-12-30 NOTE — Anesthesia Preprocedure Evaluation (Addendum)
Anesthesia Evaluation  Patient identified by MRN, date of birth, ID band Patient awake    Reviewed: Allergy & Precautions, H&P , NPO status , Patient's Chart, lab work & pertinent test results, reviewed documented beta blocker date and time   History of Anesthesia Complications (+) PONV  Airway Mallampati: I TM Distance: >3 FB Neck ROM: Full    Dental  (+) Dental Advisory Given   Pulmonary former smoker,          Cardiovascular hypertension, Pt. on medications and Pt. on home beta blockers + CAD and + Peripheral Vascular Disease     Neuro/Psych    GI/Hepatic GERD-  ,  Endo/Other    Renal/GU Renal InsufficiencyRenal disease     Musculoskeletal  (+) Arthritis -,   Abdominal   Peds  Hematology   Anesthesia Other Findings   Reproductive/Obstetrics                          Anesthesia Physical Anesthesia Plan  ASA: III  Anesthesia Plan: General   Post-op Pain Management:    Induction: Intravenous  Airway Management Planned: LMA  Additional Equipment:   Intra-op Plan:   Post-operative Plan: Extubation in OR  Informed Consent: I have reviewed the patients History and Physical, chart, labs and discussed the procedure including the risks, benefits and alternatives for the proposed anesthesia with the patient or authorized representative who has indicated his/her understanding and acceptance.   Dental advisory given  Plan Discussed with: Surgeon and CRNA  Anesthesia Plan Comments:        Anesthesia Quick Evaluation

## 2013-12-30 NOTE — Progress Notes (Signed)
Awakened from sleep. Still some recurring nausea. add'l meds given and will mionitor

## 2013-12-31 ENCOUNTER — Encounter (HOSPITAL_COMMUNITY): Payer: Self-pay | Admitting: Vascular Surgery

## 2014-01-01 ENCOUNTER — Encounter: Payer: Self-pay | Admitting: Family

## 2014-01-01 ENCOUNTER — Ambulatory Visit (INDEPENDENT_AMBULATORY_CARE_PROVIDER_SITE_OTHER): Payer: Medicare HMO | Admitting: Family

## 2014-01-01 VITALS — BP 133/89 | HR 55 | Temp 97.6°F | Resp 16 | Ht 70.0 in | Wt 159.0 lb

## 2014-01-01 DIAGNOSIS — Z5189 Encounter for other specified aftercare: Secondary | ICD-10-CM

## 2014-01-01 DIAGNOSIS — R52 Pain, unspecified: Secondary | ICD-10-CM | POA: Insufficient documentation

## 2014-01-01 DIAGNOSIS — M7989 Other specified soft tissue disorders: Secondary | ICD-10-CM

## 2014-01-01 DIAGNOSIS — Z789 Other specified health status: Secondary | ICD-10-CM

## 2014-01-01 NOTE — Progress Notes (Signed)
Established Dialysis Access  History of Present Illness  Jack [name] is a 68 y.o. (21-May-1946) male patient of Dr. Donnetta Hutching who is s/p placement of an AV fistula in 2011 and LIGATION OF left  ARTERIOVENOUS FISTULA on 12/30/2013. He returns today with complaint of large amount of clear fluid draining from the antecubital incision that started yesterday, and mild swelling in his hand and forearm that started yesterday also. He states that he slept with his left arm over his head. He states that his last creatinine was 1.42, and that will likely not need dialysis.    Past Medical History  Diagnosis Date  . CAD (coronary artery disease)     hx non obst  . HTN (hypertension)   . HLD (hyperlipidemia)   . Atheroembolism   . Atrial fibrillation   . Complication of anesthesia   . PONV (postoperative nausea and vomiting)   . Shortness of breath     after eating , if he gets active, he might have some SOB  . Arthritis     psoriatic arthritis   . Chronic kidney disease     renal stones - tx. /w lithotripsy  . Cancer     basal cell x3 , last one removed - 12/12/2013, R temple area of forehead    Social History History  Substance Use Topics  . Smoking status: Former Smoker    Quit date: 05/21/1970  . Smokeless tobacco: Never Used     Comment: quit 40+ years ago   . Alcohol Use: No    Family History Family History  Problem Relation Age of Onset  . Coronary artery disease      family hx  . Cancer Mother   . Heart disease Father     Before age 54  . Asthma Father   . Heart disease Brother     After 31 years of age    Surgical History Past Surgical History  Procedure Laterality Date  . Coronary angioplasty    . Av fistula placement  07-2009    Left Brachiocephalic AVF  . Skin cancer removed      multiple basal cell  . Lithotripsy    . Ligation of arteriovenous  fistula Left 12/30/2013    Procedure: EXCISION OF LEFT ARM ARTERIOVENOUS  FISTULA;  Surgeon: Rosetta Posner, MD;   Location: [facility];  Service: Vascular;  Laterality: Left;    Allergies  Allergen Reactions  . Hydrocodone Nausea Only  . Oxycodone Nausea And Vomiting  . Hydrochlorothiazide     anxious  . Hytrin [Terazosin Hcl]     Heart races  . Sulfa Antibiotics Itching    Current Outpatient Prescriptions  Medication Sig Dispense Refill  . Acetaminophen (APAP) 325 MG tablet Take 650 mg by mouth every 4 (four) hours as needed for pain or fever. Take 2      . amLODipine (NORVASC) 10 MG tablet Take 10 mg by mouth daily with breakfast.      . aspirin 81 MG tablet Take 81 mg by mouth every other day.        . betamethasone dipropionate (DIPROLENE) 0.05 % cream Apply 1 application topically 2 (two) times daily as needed (for psoriasis).      . cholecalciferol (VITAMIN D) 1000 UNITS tablet Take 1,000 Units by mouth daily.      . furosemide (LASIX) 40 MG tablet Take 20 mg by mouth daily.       Marland Kitchen labetalol (NORMODYNE) 200 MG tablet  TAKE 1 TABLET TWO TIMES A DAY      . pravastatin (PRAVACHOL) 40 MG tablet Take 40 mg by mouth daily.      . Triamcinolone Acetonide (KENALOG IJ) Inject 1 application as directed every 6 (six) months.      . traMADol (ULTRAM) 50 MG tablet Take 1 tablet (50 mg total) by mouth every 6 (six) hours as needed.  20 tablet  0   No current facility-administered medications for this visit.     REVIEW OF SYSTEMS: see HPI for pertinent positives and negatives    PHYSICAL EXAMINATION:  Filed Vitals:   01/01/14 1005  BP: 133/89  Pulse: 55  Temp: 97.6 F (36.4 C)  TempSrc: Oral  Resp: 16  Height: 5\' 10"  (1.778 m)  Weight: 159 lb (72.122 kg)  SpO2: 98%   Body mass index is 22.81 kg/(m^2).  General: The patient appears their stated age, wife present.  HEENT:  No gross abnormalities Pulmonary: Respirations are non-labored Musculoskeletal: There are no major deformities.   Neurologic: No focal weakness or paresthesias are detected Skin: There are no ulcer or rashes  noted. Psychiatric: The patient has normal affect. Cardiovascular: There is a regular rate and rhythm.  2+ palpable radial pulses. No erythema at the two left upper arm ligation sites. No drainage from the proximal incision. The antecubital incision site has a small amount of swelling and is steadily leaking small amount of clear thin fluid. Both incisions have steri strips intact . No erythema at either site.   Jack [name] is a 68 y.o. male who has a draining seroma 2 days s/p ligation of left upper arm AVF. The drainage is expected to taper and resolve. Cleanse surgical site twice daily with soap and water, apply 4x4 gauze dressings, ace wrap starting at left hand to ligation site to minimize tourniquet effect of ace wrap at ligation site only, thereby minimizing left forearm and hand swelling. Dr. Bridgett Larsson spoke with and examined pt Return as scheduled on 01/12/14 to see Dr. Donnetta Hutching.  Thirza Pellicano, Sharmon Leyden, RN, MSN, FNP-C [facility] Office: [phone]  01/01/2014, 10:15 AM  Clinic MD: Bridgett Larsson

## 2014-01-11 ENCOUNTER — Encounter: Payer: Self-pay | Admitting: Vascular Surgery

## 2014-01-12 ENCOUNTER — Ambulatory Visit (INDEPENDENT_AMBULATORY_CARE_PROVIDER_SITE_OTHER): Payer: Medicare HMO | Admitting: Vascular Surgery

## 2014-01-12 ENCOUNTER — Encounter: Payer: Self-pay | Admitting: Vascular Surgery

## 2014-01-12 VITALS — BP 139/88 | HR 67 | Resp 18 | Ht 70.0 in | Wt 160.0 lb

## 2014-01-12 DIAGNOSIS — T82898A Other specified complication of vascular prosthetic devices, implants and grafts, initial encounter: Secondary | ICD-10-CM

## 2014-01-12 NOTE — Progress Notes (Signed)
Here today for followup for removal of large aneurysmal left upper arm AV fistula on 12/30/2013. He does display several years ago and had a marked progression in size and discomfort associated with this. It never progressed to renal failure. This was not removed and resected with per her her work care as an outpatient.  He has done quite well. His antecubital and upper arm incisions have completely healed. It is a dramatic change from preop with a very large venous aneurysm. He does report some stiffness and pulling sensation and occasional stinging in the incision. He does have a 2+ radial pulse.  Impression and plan: Stable status post resection of large venous aneurysm or AV fistula. Wounds are all healing. He will continue to resume his full activity without restriction and will see [place] in as-needed basis

## 2015-10-26 DIAGNOSIS — J22 Unspecified acute lower respiratory infection: Secondary | ICD-10-CM | POA: Diagnosis not present

## 2015-10-26 DIAGNOSIS — J988 Other specified respiratory disorders: Secondary | ICD-10-CM | POA: Diagnosis not present

## 2015-11-23 DIAGNOSIS — Z9181 History of falling: Secondary | ICD-10-CM | POA: Diagnosis not present

## 2015-11-23 DIAGNOSIS — Z1389 Encounter for screening for other disorder: Secondary | ICD-10-CM | POA: Diagnosis not present

## 2015-11-23 DIAGNOSIS — Z125 Encounter for screening for malignant neoplasm of prostate: Secondary | ICD-10-CM | POA: Diagnosis not present

## 2015-11-23 DIAGNOSIS — Z1212 Encounter for screening for malignant neoplasm of rectum: Secondary | ICD-10-CM | POA: Diagnosis not present

## 2015-11-23 DIAGNOSIS — N183 Chronic kidney disease, stage 3 (moderate): Secondary | ICD-10-CM | POA: Diagnosis not present

## 2015-11-23 DIAGNOSIS — Z1211 Encounter for screening for malignant neoplasm of colon: Secondary | ICD-10-CM | POA: Diagnosis not present

## 2015-11-23 DIAGNOSIS — E785 Hyperlipidemia, unspecified: Secondary | ICD-10-CM | POA: Diagnosis not present

## 2015-11-23 DIAGNOSIS — Z0000 Encounter for general adult medical examination without abnormal findings: Secondary | ICD-10-CM | POA: Diagnosis not present

## 2015-11-23 DIAGNOSIS — I129 Hypertensive chronic kidney disease with stage 1 through stage 4 chronic kidney disease, or unspecified chronic kidney disease: Secondary | ICD-10-CM | POA: Diagnosis not present

## 2015-11-23 DIAGNOSIS — E559 Vitamin D deficiency, unspecified: Secondary | ICD-10-CM | POA: Diagnosis not present

## 2015-11-30 DIAGNOSIS — R5383 Other fatigue: Secondary | ICD-10-CM | POA: Diagnosis not present

## 2015-11-30 DIAGNOSIS — L409 Psoriasis, unspecified: Secondary | ICD-10-CM | POA: Diagnosis not present

## 2015-11-30 DIAGNOSIS — Z87898 Personal history of other specified conditions: Secondary | ICD-10-CM | POA: Diagnosis not present

## 2015-11-30 DIAGNOSIS — N2581 Secondary hyperparathyroidism of renal origin: Secondary | ICD-10-CM | POA: Diagnosis not present

## 2015-11-30 DIAGNOSIS — N183 Chronic kidney disease, stage 3 (moderate): Secondary | ICD-10-CM | POA: Diagnosis not present

## 2015-11-30 DIAGNOSIS — R972 Elevated prostate specific antigen [PSA]: Secondary | ICD-10-CM | POA: Diagnosis not present

## 2015-11-30 DIAGNOSIS — I129 Hypertensive chronic kidney disease with stage 1 through stage 4 chronic kidney disease, or unspecified chronic kidney disease: Secondary | ICD-10-CM | POA: Diagnosis not present

## 2015-11-30 DIAGNOSIS — J302 Other seasonal allergic rhinitis: Secondary | ICD-10-CM | POA: Diagnosis not present

## 2015-11-30 DIAGNOSIS — E785 Hyperlipidemia, unspecified: Secondary | ICD-10-CM | POA: Diagnosis not present

## 2015-12-13 DIAGNOSIS — R972 Elevated prostate specific antigen [PSA]: Secondary | ICD-10-CM | POA: Diagnosis not present

## 2015-12-13 DIAGNOSIS — N486 Induration penis plastica: Secondary | ICD-10-CM | POA: Diagnosis not present

## 2015-12-13 DIAGNOSIS — N401 Enlarged prostate with lower urinary tract symptoms: Secondary | ICD-10-CM | POA: Diagnosis not present

## 2015-12-13 DIAGNOSIS — R351 Nocturia: Secondary | ICD-10-CM | POA: Diagnosis not present

## 2016-02-14 DIAGNOSIS — Z23 Encounter for immunization: Secondary | ICD-10-CM | POA: Diagnosis not present

## 2016-03-07 DIAGNOSIS — R5383 Other fatigue: Secondary | ICD-10-CM | POA: Diagnosis not present

## 2016-03-07 DIAGNOSIS — D485 Neoplasm of uncertain behavior of skin: Secondary | ICD-10-CM | POA: Diagnosis not present

## 2016-03-07 DIAGNOSIS — J309 Allergic rhinitis, unspecified: Secondary | ICD-10-CM | POA: Diagnosis not present

## 2016-03-07 DIAGNOSIS — L409 Psoriasis, unspecified: Secondary | ICD-10-CM | POA: Diagnosis not present

## 2016-03-13 DIAGNOSIS — D485 Neoplasm of uncertain behavior of skin: Secondary | ICD-10-CM | POA: Diagnosis not present

## 2016-03-16 DIAGNOSIS — E162 Hypoglycemia, unspecified: Secondary | ICD-10-CM | POA: Diagnosis not present

## 2016-03-16 DIAGNOSIS — E161 Other hypoglycemia: Secondary | ICD-10-CM | POA: Diagnosis not present

## 2016-05-08 DIAGNOSIS — Z136 Encounter for screening for cardiovascular disorders: Secondary | ICD-10-CM | POA: Diagnosis not present

## 2016-05-08 DIAGNOSIS — E785 Hyperlipidemia, unspecified: Secondary | ICD-10-CM | POA: Diagnosis not present

## 2016-05-08 DIAGNOSIS — I2510 Atherosclerotic heart disease of native coronary artery without angina pectoris: Secondary | ICD-10-CM | POA: Diagnosis not present

## 2016-05-08 DIAGNOSIS — Z0000 Encounter for general adult medical examination without abnormal findings: Secondary | ICD-10-CM | POA: Diagnosis not present

## 2016-05-08 DIAGNOSIS — Z125 Encounter for screening for malignant neoplasm of prostate: Secondary | ICD-10-CM | POA: Diagnosis not present

## 2016-05-08 DIAGNOSIS — I10 Essential (primary) hypertension: Secondary | ICD-10-CM | POA: Diagnosis not present

## 2016-06-18 DIAGNOSIS — J302 Other seasonal allergic rhinitis: Secondary | ICD-10-CM | POA: Diagnosis not present

## 2016-06-18 DIAGNOSIS — L409 Psoriasis, unspecified: Secondary | ICD-10-CM | POA: Diagnosis not present

## 2016-06-18 DIAGNOSIS — E785 Hyperlipidemia, unspecified: Secondary | ICD-10-CM | POA: Diagnosis not present

## 2016-06-18 DIAGNOSIS — Z79899 Other long term (current) drug therapy: Secondary | ICD-10-CM | POA: Diagnosis not present

## 2016-06-18 DIAGNOSIS — E559 Vitamin D deficiency, unspecified: Secondary | ICD-10-CM | POA: Diagnosis not present

## 2016-06-18 DIAGNOSIS — I129 Hypertensive chronic kidney disease with stage 1 through stage 4 chronic kidney disease, or unspecified chronic kidney disease: Secondary | ICD-10-CM | POA: Diagnosis not present

## 2016-06-18 DIAGNOSIS — R972 Elevated prostate specific antigen [PSA]: Secondary | ICD-10-CM | POA: Diagnosis not present

## 2016-06-18 DIAGNOSIS — N2581 Secondary hyperparathyroidism of renal origin: Secondary | ICD-10-CM | POA: Diagnosis not present

## 2016-06-18 DIAGNOSIS — N183 Chronic kidney disease, stage 3 (moderate): Secondary | ICD-10-CM | POA: Diagnosis not present

## 2016-06-19 DIAGNOSIS — L299 Pruritus, unspecified: Secondary | ICD-10-CM | POA: Diagnosis not present

## 2016-06-19 DIAGNOSIS — L570 Actinic keratosis: Secondary | ICD-10-CM | POA: Diagnosis not present

## 2016-06-19 DIAGNOSIS — D485 Neoplasm of uncertain behavior of skin: Secondary | ICD-10-CM | POA: Diagnosis not present

## 2016-06-19 DIAGNOSIS — L300 Nummular dermatitis: Secondary | ICD-10-CM | POA: Diagnosis not present

## 2016-08-21 DIAGNOSIS — I4891 Unspecified atrial fibrillation: Secondary | ICD-10-CM | POA: Diagnosis not present

## 2016-08-21 DIAGNOSIS — Z1389 Encounter for screening for other disorder: Secondary | ICD-10-CM | POA: Diagnosis not present

## 2016-08-21 DIAGNOSIS — I129 Hypertensive chronic kidney disease with stage 1 through stage 4 chronic kidney disease, or unspecified chronic kidney disease: Secondary | ICD-10-CM | POA: Diagnosis not present

## 2016-08-21 DIAGNOSIS — Z79899 Other long term (current) drug therapy: Secondary | ICD-10-CM | POA: Diagnosis not present

## 2016-08-21 DIAGNOSIS — N2581 Secondary hyperparathyroidism of renal origin: Secondary | ICD-10-CM | POA: Diagnosis not present

## 2016-08-21 DIAGNOSIS — N183 Chronic kidney disease, stage 3 (moderate): Secondary | ICD-10-CM | POA: Diagnosis not present

## 2016-08-21 DIAGNOSIS — Z87898 Personal history of other specified conditions: Secondary | ICD-10-CM | POA: Diagnosis not present

## 2016-08-21 DIAGNOSIS — E785 Hyperlipidemia, unspecified: Secondary | ICD-10-CM | POA: Diagnosis not present

## 2016-08-21 DIAGNOSIS — J302 Other seasonal allergic rhinitis: Secondary | ICD-10-CM | POA: Diagnosis not present

## 2016-08-31 DIAGNOSIS — H524 Presbyopia: Secondary | ICD-10-CM | POA: Diagnosis not present

## 2016-12-03 DIAGNOSIS — Z125 Encounter for screening for malignant neoplasm of prostate: Secondary | ICD-10-CM | POA: Diagnosis not present

## 2016-12-03 DIAGNOSIS — E785 Hyperlipidemia, unspecified: Secondary | ICD-10-CM | POA: Diagnosis not present

## 2016-12-03 DIAGNOSIS — Z0000 Encounter for general adult medical examination without abnormal findings: Secondary | ICD-10-CM | POA: Diagnosis not present

## 2016-12-03 DIAGNOSIS — R7302 Impaired glucose tolerance (oral): Secondary | ICD-10-CM | POA: Diagnosis not present

## 2016-12-03 DIAGNOSIS — Z1389 Encounter for screening for other disorder: Secondary | ICD-10-CM | POA: Diagnosis not present

## 2016-12-03 DIAGNOSIS — I10 Essential (primary) hypertension: Secondary | ICD-10-CM | POA: Diagnosis not present

## 2016-12-03 DIAGNOSIS — Z1211 Encounter for screening for malignant neoplasm of colon: Secondary | ICD-10-CM | POA: Diagnosis not present

## 2016-12-03 DIAGNOSIS — Z9181 History of falling: Secondary | ICD-10-CM | POA: Diagnosis not present

## 2016-12-05 DIAGNOSIS — Z1211 Encounter for screening for malignant neoplasm of colon: Secondary | ICD-10-CM | POA: Diagnosis not present

## 2016-12-12 DIAGNOSIS — R972 Elevated prostate specific antigen [PSA]: Secondary | ICD-10-CM | POA: Diagnosis not present

## 2016-12-12 DIAGNOSIS — R351 Nocturia: Secondary | ICD-10-CM | POA: Diagnosis not present

## 2016-12-12 DIAGNOSIS — N401 Enlarged prostate with lower urinary tract symptoms: Secondary | ICD-10-CM | POA: Diagnosis not present

## 2016-12-14 DIAGNOSIS — N184 Chronic kidney disease, stage 4 (severe): Secondary | ICD-10-CM | POA: Diagnosis not present

## 2016-12-14 DIAGNOSIS — Z9181 History of falling: Secondary | ICD-10-CM | POA: Diagnosis not present

## 2016-12-14 DIAGNOSIS — E785 Hyperlipidemia, unspecified: Secondary | ICD-10-CM | POA: Diagnosis not present

## 2016-12-14 DIAGNOSIS — I10 Essential (primary) hypertension: Secondary | ICD-10-CM | POA: Diagnosis not present

## 2016-12-21 DIAGNOSIS — N184 Chronic kidney disease, stage 4 (severe): Secondary | ICD-10-CM | POA: Diagnosis not present

## 2016-12-21 DIAGNOSIS — E785 Hyperlipidemia, unspecified: Secondary | ICD-10-CM | POA: Diagnosis not present

## 2016-12-21 DIAGNOSIS — I4891 Unspecified atrial fibrillation: Secondary | ICD-10-CM | POA: Diagnosis not present

## 2016-12-21 DIAGNOSIS — L409 Psoriasis, unspecified: Secondary | ICD-10-CM | POA: Diagnosis not present

## 2016-12-21 DIAGNOSIS — J302 Other seasonal allergic rhinitis: Secondary | ICD-10-CM | POA: Diagnosis not present

## 2016-12-21 DIAGNOSIS — N2581 Secondary hyperparathyroidism of renal origin: Secondary | ICD-10-CM | POA: Diagnosis not present

## 2016-12-21 DIAGNOSIS — R972 Elevated prostate specific antigen [PSA]: Secondary | ICD-10-CM | POA: Diagnosis not present

## 2016-12-21 DIAGNOSIS — I10 Essential (primary) hypertension: Secondary | ICD-10-CM | POA: Diagnosis not present

## 2017-01-01 ENCOUNTER — Other Ambulatory Visit: Payer: Self-pay | Admitting: Nephrology

## 2017-01-01 DIAGNOSIS — N184 Chronic kidney disease, stage 4 (severe): Secondary | ICD-10-CM

## 2017-01-01 DIAGNOSIS — N2581 Secondary hyperparathyroidism of renal origin: Secondary | ICD-10-CM | POA: Diagnosis not present

## 2017-01-01 DIAGNOSIS — N183 Chronic kidney disease, stage 3 (moderate): Secondary | ICD-10-CM | POA: Diagnosis not present

## 2017-01-09 DIAGNOSIS — N184 Chronic kidney disease, stage 4 (severe): Secondary | ICD-10-CM | POA: Diagnosis not present

## 2017-01-16 DIAGNOSIS — N130 Hydronephrosis with ureteropelvic junction obstruction: Secondary | ICD-10-CM | POA: Diagnosis not present

## 2017-01-22 DIAGNOSIS — N130 Hydronephrosis with ureteropelvic junction obstruction: Secondary | ICD-10-CM | POA: Diagnosis not present

## 2017-01-22 DIAGNOSIS — N1330 Unspecified hydronephrosis: Secondary | ICD-10-CM | POA: Diagnosis not present

## 2017-01-29 DIAGNOSIS — N183 Chronic kidney disease, stage 3 (moderate): Secondary | ICD-10-CM | POA: Diagnosis not present

## 2017-01-29 DIAGNOSIS — N2581 Secondary hyperparathyroidism of renal origin: Secondary | ICD-10-CM | POA: Diagnosis not present

## 2017-01-30 ENCOUNTER — Other Ambulatory Visit (HOSPITAL_COMMUNITY): Payer: Self-pay | Admitting: Urology

## 2017-01-30 DIAGNOSIS — N130 Hydronephrosis with ureteropelvic junction obstruction: Secondary | ICD-10-CM

## 2017-02-13 DIAGNOSIS — L820 Inflamed seborrheic keratosis: Secondary | ICD-10-CM | POA: Diagnosis not present

## 2017-02-13 DIAGNOSIS — L299 Pruritus, unspecified: Secondary | ICD-10-CM | POA: Diagnosis not present

## 2017-02-13 DIAGNOSIS — C44319 Basal cell carcinoma of skin of other parts of face: Secondary | ICD-10-CM | POA: Diagnosis not present

## 2017-02-27 ENCOUNTER — Ambulatory Visit (HOSPITAL_COMMUNITY)
Admission: RE | Admit: 2017-02-27 | Discharge: 2017-02-27 | Disposition: A | Discharge status: Home / Self Care | Payer: Medicare HMO | Source: Ambulatory Visit | Attending: Urology | Admitting: Urology

## 2017-02-27 DIAGNOSIS — N130 Hydronephrosis with ureteropelvic junction obstruction: Secondary | ICD-10-CM | POA: Diagnosis not present

## 2017-02-27 DIAGNOSIS — N189 Chronic kidney disease, unspecified: Secondary | ICD-10-CM | POA: Diagnosis not present

## 2017-02-27 MED ORDER — FUROSEMIDE 10 MG/ML IJ SOLN: 40.0000 mg | Freq: Once | INTRAMUSCULAR | Status: DC

## 2017-02-27 MED ORDER — FUROSEMIDE 10 MG/ML IJ SOLN
36.0000 mg | Freq: Once | INTRAMUSCULAR | Status: AC
  Administered 2017-02-27: 36 mg via INTRAVENOUS

## 2017-02-27 MED ORDER — TECHNETIUM TC 99M MERTIATIDE
5.0000 | Freq: Once | INTRAVENOUS | Status: AC | PRN
  Administered 2017-02-27: 5 via INTRAVENOUS

## 2017-02-27 MED ORDER — FUROSEMIDE 10 MG/ML IJ SOLN
INTRAMUSCULAR | Status: AC
  Filled 2017-02-27: qty 4

## 2017-03-19 DIAGNOSIS — I129 Hypertensive chronic kidney disease with stage 1 through stage 4 chronic kidney disease, or unspecified chronic kidney disease: Secondary | ICD-10-CM | POA: Diagnosis not present

## 2017-03-19 DIAGNOSIS — I2510 Atherosclerotic heart disease of native coronary artery without angina pectoris: Secondary | ICD-10-CM | POA: Diagnosis not present

## 2017-03-19 DIAGNOSIS — Z23 Encounter for immunization: Secondary | ICD-10-CM | POA: Diagnosis not present

## 2017-03-19 DIAGNOSIS — N2581 Secondary hyperparathyroidism of renal origin: Secondary | ICD-10-CM | POA: Diagnosis not present

## 2017-03-19 DIAGNOSIS — N184 Chronic kidney disease, stage 4 (severe): Secondary | ICD-10-CM | POA: Diagnosis not present

## 2017-03-19 DIAGNOSIS — I4891 Unspecified atrial fibrillation: Secondary | ICD-10-CM | POA: Diagnosis not present

## 2017-03-19 DIAGNOSIS — E785 Hyperlipidemia, unspecified: Secondary | ICD-10-CM | POA: Diagnosis not present

## 2017-04-03 DIAGNOSIS — N184 Chronic kidney disease, stage 4 (severe): Secondary | ICD-10-CM | POA: Diagnosis not present

## 2017-05-01 DIAGNOSIS — N2581 Secondary hyperparathyroidism of renal origin: Secondary | ICD-10-CM | POA: Diagnosis not present

## 2017-05-01 DIAGNOSIS — R251 Tremor, unspecified: Secondary | ICD-10-CM | POA: Diagnosis not present

## 2017-05-01 DIAGNOSIS — I4891 Unspecified atrial fibrillation: Secondary | ICD-10-CM | POA: Diagnosis not present

## 2017-05-01 DIAGNOSIS — I10 Essential (primary) hypertension: Secondary | ICD-10-CM | POA: Diagnosis not present

## 2017-05-01 DIAGNOSIS — E785 Hyperlipidemia, unspecified: Secondary | ICD-10-CM | POA: Diagnosis not present

## 2017-05-01 DIAGNOSIS — L409 Psoriasis, unspecified: Secondary | ICD-10-CM | POA: Diagnosis not present

## 2017-05-01 DIAGNOSIS — R739 Hyperglycemia, unspecified: Secondary | ICD-10-CM | POA: Diagnosis not present

## 2017-05-01 DIAGNOSIS — N184 Chronic kidney disease, stage 4 (severe): Secondary | ICD-10-CM | POA: Diagnosis not present

## 2017-05-01 DIAGNOSIS — J302 Other seasonal allergic rhinitis: Secondary | ICD-10-CM | POA: Diagnosis not present

## 2017-05-08 DIAGNOSIS — E785 Hyperlipidemia, unspecified: Secondary | ICD-10-CM | POA: Diagnosis not present

## 2017-05-08 DIAGNOSIS — Z0000 Encounter for general adult medical examination without abnormal findings: Secondary | ICD-10-CM | POA: Diagnosis not present

## 2017-05-08 DIAGNOSIS — Z1331 Encounter for screening for depression: Secondary | ICD-10-CM | POA: Diagnosis not present

## 2017-07-09 DIAGNOSIS — I129 Hypertensive chronic kidney disease with stage 1 through stage 4 chronic kidney disease, or unspecified chronic kidney disease: Secondary | ICD-10-CM | POA: Diagnosis not present

## 2017-07-09 DIAGNOSIS — N183 Chronic kidney disease, stage 3 (moderate): Secondary | ICD-10-CM | POA: Diagnosis not present

## 2017-07-09 DIAGNOSIS — N2581 Secondary hyperparathyroidism of renal origin: Secondary | ICD-10-CM | POA: Diagnosis not present

## 2017-08-19 DIAGNOSIS — L409 Psoriasis, unspecified: Secondary | ICD-10-CM | POA: Diagnosis not present

## 2017-08-19 DIAGNOSIS — I4891 Unspecified atrial fibrillation: Secondary | ICD-10-CM | POA: Diagnosis not present

## 2017-08-19 DIAGNOSIS — E161 Other hypoglycemia: Secondary | ICD-10-CM | POA: Diagnosis not present

## 2017-08-19 DIAGNOSIS — J302 Other seasonal allergic rhinitis: Secondary | ICD-10-CM | POA: Diagnosis not present

## 2017-08-19 DIAGNOSIS — N2581 Secondary hyperparathyroidism of renal origin: Secondary | ICD-10-CM | POA: Diagnosis not present

## 2017-08-19 DIAGNOSIS — I10 Essential (primary) hypertension: Secondary | ICD-10-CM | POA: Diagnosis not present

## 2017-08-19 DIAGNOSIS — R251 Tremor, unspecified: Secondary | ICD-10-CM | POA: Diagnosis not present

## 2017-08-19 DIAGNOSIS — N184 Chronic kidney disease, stage 4 (severe): Secondary | ICD-10-CM | POA: Diagnosis not present

## 2017-08-19 DIAGNOSIS — E785 Hyperlipidemia, unspecified: Secondary | ICD-10-CM | POA: Diagnosis not present

## 2017-08-30 ENCOUNTER — Encounter: Payer: Self-pay | Admitting: Gastroenterology

## 2017-09-04 ENCOUNTER — Encounter: Payer: Self-pay | Admitting: Gastroenterology

## 2017-09-05 DIAGNOSIS — H5203 Hypermetropia, bilateral: Secondary | ICD-10-CM | POA: Diagnosis not present

## 2017-10-03 ENCOUNTER — Ambulatory Visit (AMBULATORY_SURGERY_CENTER): Payer: Self-pay

## 2017-10-03 ENCOUNTER — Other Ambulatory Visit: Payer: Self-pay

## 2017-10-03 VITALS — Ht 70.0 in | Wt 159.0 lb

## 2017-10-03 DIAGNOSIS — Z86010 Personal history of colonic polyps: Secondary | ICD-10-CM

## 2017-10-03 MED ORDER — NA SULFATE-K SULFATE-MG SULF 17.5-3.13-1.6 GM/177ML PO SOLN: 1.0000 | Freq: Once | ORAL | 0 refills | Status: AC

## 2017-10-03 NOTE — Progress Notes (Signed)
Denies allergies to eggs or soy products. Denies complication of anesthesia or sedation. Denies use of weight loss medication. Denies use of O2.   Emmi instructions declined.  

## 2017-10-09 ENCOUNTER — Encounter: Payer: Self-pay | Admitting: Gastroenterology

## 2017-10-15 DIAGNOSIS — L814 Other melanin hyperpigmentation: Secondary | ICD-10-CM | POA: Diagnosis not present

## 2017-10-15 DIAGNOSIS — C44319 Basal cell carcinoma of skin of other parts of face: Secondary | ICD-10-CM | POA: Diagnosis not present

## 2017-10-15 DIAGNOSIS — L299 Pruritus, unspecified: Secondary | ICD-10-CM | POA: Diagnosis not present

## 2017-10-15 DIAGNOSIS — L570 Actinic keratosis: Secondary | ICD-10-CM | POA: Diagnosis not present

## 2017-10-15 DIAGNOSIS — D2239 Melanocytic nevi of other parts of face: Secondary | ICD-10-CM | POA: Diagnosis not present

## 2017-10-17 ENCOUNTER — Other Ambulatory Visit: Payer: Self-pay

## 2017-10-17 ENCOUNTER — Encounter: Payer: Self-pay | Admitting: Gastroenterology

## 2017-10-17 ENCOUNTER — Ambulatory Visit (AMBULATORY_SURGERY_CENTER): Payer: Medicare HMO | Admitting: Gastroenterology

## 2017-10-17 VITALS — BP 142/75 | HR 59 | Temp 97.1°F | Resp 18 | Ht 70.0 in | Wt 160.0 lb

## 2017-10-17 DIAGNOSIS — Z86010 Personal history of colonic polyps: Secondary | ICD-10-CM | POA: Diagnosis present

## 2017-10-17 DIAGNOSIS — I10 Essential (primary) hypertension: Secondary | ICD-10-CM | POA: Diagnosis not present

## 2017-10-17 DIAGNOSIS — I4891 Unspecified atrial fibrillation: Secondary | ICD-10-CM | POA: Diagnosis not present

## 2017-10-17 MED ORDER — SODIUM CHLORIDE 0.9 % IV SOLN: 500.0000 mL | Freq: Once | INTRAVENOUS | Status: DC

## 2017-10-17 NOTE — Patient Instructions (Signed)
*  Handouts given on diverticulosis, hemorrhoids and high fiber diet just no high fiber for first meal.  YOU HAD AN ENDOSCOPIC PROCEDURE TODAY AT [facility]:   Refer to the procedure report that was given to you for any specific *Handiquestions about what was found during the examination.  If the procedure report does not answer your questions, please call your gastroenterologist to clarify.  If you requested that your care partner not be given the details of your procedure findings, then the procedure report has been included in a sealed envelope for you to review at your convenience later.  YOU SHOULD EXPECT: Some feelings of bloating in the abdomen. Passage of more gas than usual.  Walking can help get rid of the air that was put into your GI tract during the procedure and reduce the bloating. If you had a lower endoscopy (such as a colonoscopy or flexible sigmoidoscopy) you may notice spotting of blood in your stool or on the toilet paper. If you underwent a bowel prep for your procedure, you may not have a normal bowel movement for a few days.  Please Note:  You might notice some irritation and congestion in your nose or some drainage.  This is from the oxygen used during your procedure.  There is no need for concern and it should clear up in a day or so.  SYMPTOMS TO REPORT IMMEDIATELY:   Following lower endoscopy (colonoscopy or flexible sigmoidoscopy):  Excessive amounts of blood in the stool  Significant tenderness or worsening of abdominal pains  Swelling of the abdomen that is new, acute  Fever of 100F or higher   For urgent or emergent issues, a gastroenterologist can be reached at any hour by calling [phone].   DIET:  We do recommend a small meal at first, but then you may proceed to your regular diet.  Drink plenty of fluids but you should avoid alcoholic beverages for 24 hours.  ACTIVITY:  You should plan to take it easy for the rest of today and you  should NOT DRIVE or use heavy machinery until tomorrow (because of the sedation medicines used during the test).    FOLLOW UP: Our staff will call the number listed on your records the next business day following your procedure to check on you and address any questions or concerns that you may have regarding the information given to you following your procedure. If we do not reach you, we will leave a message.  However, if you are feeling well and you are not experiencing any problems, there is no need to return our call.  We will assume that you have returned to your regular daily activities without incident.  If any biopsies were taken you will be contacted by phone or by letter within the next 1-3 weeks.  Please call us at [phone] if you have not heard about the biopsies in 3 weeks.    SIGNATURES/CONFIDENTIALITY: You and/or your care partner have signed paperwork which will be entered into your electronic medical record.  These signatures attest to the fact that that the information above on your After Visit Summary has been reviewed and is understood.  Full responsibility of the confidentiality of this discharge information lies with you and/or your care-partner.

## 2017-10-17 NOTE — Op Note (Signed)
[facility] Patient Name: Jack [name] Procedure Date: 10/17/2017 10:42 AM MRN: [id number] Endoscopist: Jackquline Denmark , MD Age: 72 Referring MD:  Date of Birth: 27-Feb-1946 Gender: Male Account #: [id number] Procedure:                Colonoscopy Indications:              High risk colon cancer surveillance: Personal                            history of colonic polyps Medicines:                Monitored Anesthesia Care Procedure:                Pre-Anesthesia Assessment:                           - Prior to the procedure, a History and Physical                            was performed, and patient medications and                            allergies were reviewed. The patient is competent.                            The risks and benefits of the procedure and the                            sedation options and risks were discussed with the                            patient. All questions were answered and informed                            consent was obtained. Patient identification and                            proposed procedure were verified by the physician                            in the procedure room. Mental Status Examination:                            alert and oriented. Prophylactic Antibiotics: The                            patient does not require prophylactic antibiotics.                            Prior Anticoagulants: The patient has taken no                            previous anticoagulant or antiplatelet agents. ASA  Grade Assessment: II - A patient with mild systemic                            disease. After reviewing the risks and benefits,                            the patient was deemed in satisfactory condition to                            undergo the procedure. The anesthesia plan was to                            use monitored anesthesia care (MAC). Immediately                            prior to administration of  medications, the patient                            was re-assessed for adequacy to receive sedatives.                            The heart rate, respiratory rate, oxygen                            saturations, blood pressure, adequacy of pulmonary                            ventilation, and response to care were monitored                            throughout the procedure. The physical status of                            the patient was re-assessed after the procedure.                           After obtaining informed consent, the colonoscope                            was passed under direct vision. Throughout the                            procedure, the patient's blood pressure, pulse, and                            oxygen saturations were monitored continuously. The                            Colonoscope was introduced through the anus and                            advanced to the the cecum, identified by  appendiceal orifice and ileocecal valve. The                            colonoscopy was performed without difficulty. The                            patient tolerated the procedure well. The quality                            of the bowel preparation was good. Scope In: 10:51:36 AM Scope Out: 23:53:61 AM Scope Withdrawal Time: 0 hours 11 minutes 18 seconds  Total Procedure Duration: 0 hours 14 minutes 31 seconds  Findings:                 A few small-mouthed diverticula were found in the                            sigmoid colon, descending colon and ascending colon.                           Non-bleeding internal hemorrhoids were found during                            retroflexion. Complications:            No immediate complications. Estimated Blood Loss:     Estimated blood loss: none. Impression:               - Mild pancolonic diverticulosis                           - Non-bleeding internal hemorrhoids. Recommendation:           - Patient has a  contact number available for                            emergencies. The signs and symptoms of potential                            delayed complications were discussed with the                            patient. Return to normal activities tomorrow.                            Written discharge instructions were provided to the                            patient.                           - Resume previous diet.                           - Continue present medications.                           - No repeat colonoscopy  due to age, unless any new                            problems.                           - Return to GI clinic PRN. Jackquline Denmark, MD 10/17/2017 11:12:09 AM This report has been signed electronically.

## 2017-10-17 NOTE — Progress Notes (Signed)
Report to PACU, RN, vss, BBS= Clear.  

## 2017-10-18 ENCOUNTER — Telehealth: Payer: Self-pay | Admitting: *Deleted

## 2017-10-18 NOTE — Telephone Encounter (Signed)
  Follow up Call-  Call back number 10/17/2017  Post procedure Call Back phone  # [phone]  Permission to leave phone message Yes  Some recent data might be hidden     Patient questions:  Do you have a fever, pain , or abdominal swelling? No. Pain Score  0 *  Have you tolerated food without any problems? Yes.    Have you been able to return to your normal activities? Yes.    Do you have any questions about your discharge instructions: Diet   No. Medications  No. Follow up visit  No.  Do you have questions or concerns about your Care? No.  Actions: * If pain score is 4 or above: No action needed, pain <4.

## 2017-10-25 DIAGNOSIS — I10 Essential (primary) hypertension: Secondary | ICD-10-CM | POA: Diagnosis not present

## 2017-10-25 DIAGNOSIS — R972 Elevated prostate specific antigen [PSA]: Secondary | ICD-10-CM | POA: Diagnosis not present

## 2017-10-25 DIAGNOSIS — Z139 Encounter for screening, unspecified: Secondary | ICD-10-CM | POA: Diagnosis not present

## 2017-10-25 DIAGNOSIS — N184 Chronic kidney disease, stage 4 (severe): Secondary | ICD-10-CM | POA: Diagnosis not present

## 2017-10-25 DIAGNOSIS — R7309 Other abnormal glucose: Secondary | ICD-10-CM | POA: Diagnosis not present

## 2017-10-25 DIAGNOSIS — H539 Unspecified visual disturbance: Secondary | ICD-10-CM | POA: Diagnosis not present

## 2017-10-25 DIAGNOSIS — Z79899 Other long term (current) drug therapy: Secondary | ICD-10-CM | POA: Diagnosis not present

## 2017-10-28 DIAGNOSIS — C44329 Squamous cell carcinoma of skin of other parts of face: Secondary | ICD-10-CM | POA: Diagnosis not present

## 2017-11-13 DIAGNOSIS — H539 Unspecified visual disturbance: Secondary | ICD-10-CM | POA: Diagnosis not present

## 2017-11-13 DIAGNOSIS — I6521 Occlusion and stenosis of right carotid artery: Secondary | ICD-10-CM | POA: Diagnosis not present

## 2017-11-28 DIAGNOSIS — I6523 Occlusion and stenosis of bilateral carotid arteries: Secondary | ICD-10-CM | POA: Diagnosis not present

## 2017-12-05 DIAGNOSIS — E785 Hyperlipidemia, unspecified: Secondary | ICD-10-CM | POA: Diagnosis not present

## 2017-12-05 DIAGNOSIS — N184 Chronic kidney disease, stage 4 (severe): Secondary | ICD-10-CM | POA: Diagnosis not present

## 2017-12-05 DIAGNOSIS — I6523 Occlusion and stenosis of bilateral carotid arteries: Secondary | ICD-10-CM | POA: Diagnosis not present

## 2017-12-13 DIAGNOSIS — R972 Elevated prostate specific antigen [PSA]: Secondary | ICD-10-CM | POA: Diagnosis not present

## 2018-01-08 DIAGNOSIS — D2239 Melanocytic nevi of other parts of face: Secondary | ICD-10-CM | POA: Diagnosis not present

## 2018-01-08 DIAGNOSIS — L820 Inflamed seborrheic keratosis: Secondary | ICD-10-CM | POA: Diagnosis not present

## 2018-01-08 DIAGNOSIS — C44319 Basal cell carcinoma of skin of other parts of face: Secondary | ICD-10-CM | POA: Diagnosis not present

## 2018-01-13 DIAGNOSIS — N184 Chronic kidney disease, stage 4 (severe): Secondary | ICD-10-CM | POA: Diagnosis not present

## 2018-01-13 DIAGNOSIS — Z6822 Body mass index (BMI) 22.0-22.9, adult: Secondary | ICD-10-CM | POA: Diagnosis not present

## 2018-01-13 DIAGNOSIS — R5381 Other malaise: Secondary | ICD-10-CM | POA: Diagnosis not present

## 2018-01-14 DIAGNOSIS — C44319 Basal cell carcinoma of skin of other parts of face: Secondary | ICD-10-CM | POA: Diagnosis not present

## 2018-01-31 DIAGNOSIS — J309 Allergic rhinitis, unspecified: Secondary | ICD-10-CM | POA: Diagnosis not present

## 2018-01-31 DIAGNOSIS — T7840XA Allergy, unspecified, initial encounter: Secondary | ICD-10-CM | POA: Diagnosis not present

## 2018-01-31 DIAGNOSIS — R972 Elevated prostate specific antigen [PSA]: Secondary | ICD-10-CM | POA: Diagnosis not present

## 2018-01-31 DIAGNOSIS — R7309 Other abnormal glucose: Secondary | ICD-10-CM | POA: Diagnosis not present

## 2018-01-31 DIAGNOSIS — Z1339 Encounter for screening examination for other mental health and behavioral disorders: Secondary | ICD-10-CM | POA: Diagnosis not present

## 2018-01-31 DIAGNOSIS — N184 Chronic kidney disease, stage 4 (severe): Secondary | ICD-10-CM | POA: Diagnosis not present

## 2018-01-31 DIAGNOSIS — I10 Essential (primary) hypertension: Secondary | ICD-10-CM | POA: Diagnosis not present

## 2018-01-31 DIAGNOSIS — Z79899 Other long term (current) drug therapy: Secondary | ICD-10-CM | POA: Diagnosis not present

## 2018-01-31 DIAGNOSIS — E785 Hyperlipidemia, unspecified: Secondary | ICD-10-CM | POA: Diagnosis not present

## 2018-02-10 DIAGNOSIS — I4892 Unspecified atrial flutter: Secondary | ICD-10-CM | POA: Diagnosis not present

## 2018-02-10 DIAGNOSIS — Z6822 Body mass index (BMI) 22.0-22.9, adult: Secondary | ICD-10-CM | POA: Diagnosis not present

## 2018-02-10 DIAGNOSIS — R002 Palpitations: Secondary | ICD-10-CM | POA: Diagnosis not present

## 2018-02-19 NOTE — Progress Notes (Signed)
Chief Complaint  Patient presents with  . New Patient (Initial Visit)    CAD   History of Present Illness: 72 yo male with history of carotid artery disease, Chronic kidney disease, CAD, paroxysmal atrial fibrillation, HTN and hyperlipidemia here today as a new consult, referred by Laverna Peace, NP for evaluation of atrial fibrillation. He has been followed in the past by Dr. Johnsie Cancel. He had a cardiac cath in 2010 with Dr. Olevia Perches and was found to have mild CAD. He was suspected to have atheroemboli during the cath and had worsened renal insufficiency post cath. He has not been on coumadin. He has not been seen in our office since 2012. Last echo in our system was in 2012 and showed normal LV systolic function. There was grade 1 diastolic dysfunction. He had an AV fistula placed back in 2011 and this was removed in 2015 as he never progressed to ESRD. He woke up on 02/10/18 and felt weak and his heart was racing. He went in to see Laverna Peace, NP and was found to have atrial fibrillation with HR 100 bpm. He was started on Eliquis. Carotid artery dopplers 11/11/17 with 50-69% narrowing bilateral ICA.   He tells me today that he has mild chest pressure when his heart races. He has dyspnea with heavy exertion. He has no lower extremity edema. No dizziness. No palpitations over the past week.   Primary Care Physician: Lowella Dandy, NP   Past Medical History:  Diagnosis Date  . Allergy   . Arthritis    psoriatic arthritis   . Asthma   . Atheroembolism   . Atrial fibrillation ([place])   . CAD (coronary artery disease)    hx non obst  . Cancer ([facility])    basal cell x3 , last one removed - 12/12/2013, R temple area of forehead  . Chronic kidney disease    renal stones - tx. /w lithotripsy  . Complication of anesthesia   . HLD (hyperlipidemia)   . HTN (hypertension)   . Hypoglycemia   . PONV (postoperative nausea and vomiting)   . Psoriasis   . Shortness of breath    after eating , if he gets active, he  might have some SOB    Past Surgical History:  Procedure Laterality Date  . AV FISTULA PLACEMENT  07-2009   Left Brachiocephalic AVF  . LIGATION OF ARTERIOVENOUS  FISTULA Left 12/30/2013   Procedure: EXCISION OF LEFT ARM ARTERIOVENOUS  FISTULA;  Surgeon: Rosetta Posner, MD;  Location: [facility];  Service: Vascular;  Laterality: Left;  . LITHOTRIPSY    . skin cancer removed     multiple basal cell    Current Outpatient Medications  Medication Sig Dispense Refill  . Acetaminophen (APAP) 325 MG tablet Take 650 mg by mouth every 4 (four) hours as needed for pain or fever. Take 2    . amLODipine (NORVASC) 10 MG tablet Take 10 mg by mouth daily with breakfast.    . apixaban (ELIQUIS) 2.5 MG TABS tablet Take 2.5 mg by mouth 2 (two) times daily.    . betamethasone dipropionate (DIPROLENE) 0.05 % cream Apply 1 application topically 2 (two) times daily as needed (for psoriasis).    . labetalol (NORMODYNE) 200 MG tablet TAKE 1 TABLET TWO TIMES A DAY    . montelukast (SINGULAIR) 10 MG tablet Take 10 mg by mouth at bedtime.    Marland Kitchen OVER THE COUNTER MEDICATION Calcium with Vitamin D 3 plus minerals. One tablet  everyday.    Marland Kitchen OVER THE COUNTER MEDICATION Co Q 10, 200 mg daily.    . pravastatin (PRAVACHOL) 80 MG tablet Take 80 mg by mouth daily.    . Triamcinolone Acetonide (KENALOG IJ) Inject 1 application as directed every 6 (six) months.     Current Facility-Administered Medications  Medication Dose Route Frequency Provider Last Rate Last Dose  . 0.9 %  sodium chloride infusion  500 mL Intravenous Once Jackquline Denmark, MD        Allergies  Allergen Reactions  . Hydrocodone Nausea Only  . Oxycodone Nausea And Vomiting  . Hydrochlorothiazide     anxious  . Hytrin [Terazosin Hcl]     Heart races  . Sulfa Antibiotics Itching    Social History   Socioeconomic History  . Marital status: Married    Spouse name: Not on file  . Number of children: Not on file  . Years of education: Not on file  .  Highest education level: Not on file  Occupational History  . Not on file  Social Needs  . Financial resource strain: Not on file  . Food insecurity:    Worry: Not on file    Inability: Not on file  . Transportation needs:    Medical: Not on file    Non-medical: Not on file  Tobacco Use  . Smoking status: Former Smoker    Last attempt to quit: 05/21/1970    Years since quitting: 47.7  . Smokeless tobacco: Never Used  . Tobacco comment: quit 40+ years ago   Substance and Sexual Activity  . Alcohol use: No  . Drug use: No  . Sexual activity: Not on file  Lifestyle  . Physical activity:    Days per week: Not on file    Minutes per session: Not on file  . Stress: Not on file  Relationships  . Social connections:    Talks on phone: Not on file    Gets together: Not on file    Attends religious service: Not on file    Active member of club or organization: Not on file    Attends meetings of clubs or organizations: Not on file    Relationship status: Not on file  . Intimate partner violence:    Fear of current or ex partner: Not on file    Emotionally abused: Not on file    Physically abused: Not on file    Forced sexual activity: Not on file  Other Topics Concern  . Not on file  Social History Narrative   Married, lives with wife; Loss adjuster, chartered.     Family History  Problem Relation Age of Onset  . Cancer Mother   . Heart disease Father        Before age 66  . Asthma Father   . Heart disease Brother        After 13 years of age  . Coronary artery disease Unknown        family hx  . Colon cancer Neg Hx   . Esophageal cancer Neg Hx   . Liver cancer Neg Hx   . Pancreatic cancer Neg Hx   . Rectal cancer Neg Hx   . Stomach cancer Neg Hx     Review of Systems:  As stated in the HPI and otherwise negative.   BP 122/88   Pulse (!) 52   Ht 5\' 10"  (1.778 m)   Wt 154 lb 12.8 oz (70.2 kg)  SpO2 99%   BMI 22.21 kg/m   Physical Examination: General: Well  developed, well nourished, NAD  HEENT: OP clear, mucus membranes moist  SKIN: warm, dry. No rashes. Neuro: No focal deficits  Musculoskeletal: Muscle strength 5/5 all ext  Psychiatric: Mood and affect normal  Neck: No JVD, no carotid bruits, no thyromegaly, no lymphadenopathy.  Lungs:Clear bilaterally, no wheezes, rhonci, crackles Cardiovascular: Regular rate and rhythm. No murmurs, gallops or rubs. Abdomen:Soft. Bowel sounds present. Non-tender.  Extremities: No lower extremity edema. Pulses are 2 + in the bilateral DP/PT.  EKG:  EKG is ordered today. The ekg ordered today demonstrates Sinus bradycardia, rate 52 bpm.   Recent Labs: No results found for requested labs within last 8760 hours.   Lipid Panel    Component Value Date/Time   CHOL (H) 05/18/2009 0550    237        ATP III CLASSIFICATION:  <200     mg/dL   Desirable  200-239  mg/dL   Borderline High  >=240    mg/dL   High          TRIG 128 05/18/2009 0550   HDL 38 (L) 05/18/2009 0550   CHOLHDL 6.2 05/18/2009 0550   VLDL 26 05/18/2009 0550   LDLCALC (H) 05/18/2009 0550    173        Total Cholesterol/HDL:CHD Risk Coronary Heart Disease Risk Table                     Men   Women  1/2 Average Risk   3.4   3.3  Average Risk       5.0   4.4  2 X Average Risk   9.6   7.1  3 X Average Risk  23.4   11.0        Use the calculated Patient Ratio above and the CHD Risk Table to determine the patient's CHD Risk.        ATP III CLASSIFICATION (LDL):  <100     mg/dL   Optimal  100-129  mg/dL   Near or Above                    Optimal  130-159  mg/dL   Borderline  160-189  mg/dL   High  >190     mg/dL   Very High     Wt Readings from Last 3 Encounters:  02/20/18 154 lb 12.8 oz (70.2 kg)  10/17/17 160 lb (72.6 kg)  10/03/17 159 lb (72.1 kg)     Other studies Reviewed: Additional studies/ records that were reviewed today include: old records Review of the above records demonstrates:    Assessment and Plan:    1. Paroxysmal atrial fibrillation: Sinus today. Will continue beta blocker. Will increase Eliquis to  5 mg po BID. I have discussed dosing with our PharmD. He is over 60 kg and under 46 years old. His creatinine is 1.8 but this still fits into the higher dosing range. Will arrange echo to assess LVEF and exclude structural heart disease. Will plan 30 day cardiac monitor.   2. CAD with angina: He has rare chest pressure but has been more dyspneic. Will plan exercise nuclear stress test.   3. Carotid artery disease: Moderate bilateral disease by dopplers this summer. Will need to be followed.   4. HTN: BP is controlled.   5. Dyspnea: Stress testing and echo as above.   Current medicines are reviewed at length  with the patient today.  The patient does not have concerns regarding medicines.  The following changes have been made:  no change  Labs/ tests ordered today include:  No orders of the defined types were placed in this encounter.    Disposition:   FU with me in 4 weeks   Signed, Lauree Chandler, MD 02/20/2018 9:23 AM    [address], [place], [place]  [address] Phone: [phone]; Fax: [phone]

## 2018-02-20 ENCOUNTER — Encounter

## 2018-02-20 ENCOUNTER — Ambulatory Visit: Payer: Medicare HMO | Admitting: Cardiovascular Disease

## 2018-02-20 ENCOUNTER — Encounter: Payer: Self-pay | Admitting: *Deleted

## 2018-02-20 ENCOUNTER — Encounter (INDEPENDENT_AMBULATORY_CARE_PROVIDER_SITE_OTHER): Payer: Self-pay

## 2018-02-20 ENCOUNTER — Encounter: Payer: Self-pay | Admitting: Cardiovascular Disease

## 2018-02-20 VITALS — BP 122/88 | HR 52 | Ht 70.0 in | Wt 154.8 lb

## 2018-02-20 DIAGNOSIS — I10 Essential (primary) hypertension: Secondary | ICD-10-CM | POA: Diagnosis not present

## 2018-02-20 DIAGNOSIS — I480 Paroxysmal atrial fibrillation: Secondary | ICD-10-CM

## 2018-02-20 DIAGNOSIS — I6523 Occlusion and stenosis of bilateral carotid arteries: Secondary | ICD-10-CM

## 2018-02-20 DIAGNOSIS — R0602 Shortness of breath: Secondary | ICD-10-CM

## 2018-02-20 DIAGNOSIS — I25118 Atherosclerotic heart disease of native coronary artery with other forms of angina pectoris: Secondary | ICD-10-CM | POA: Diagnosis not present

## 2018-02-20 MED ORDER — APIXABAN 5 MG PO TABS: 5.0000 mg | ORAL_TABLET | Freq: Two times a day (BID) | ORAL | 2 refills | Status: DC

## 2018-02-20 NOTE — Patient Instructions (Signed)
Medication Instructions:  Your physician has recommended you make the following change in your medication: Increase eliquis to 5 mg by mouth twice daily   Labwork: none  Testing/Procedures: Your physician has recommended that you wear an event monitor. Event monitors are medical devices that record the heart's electrical activity. Doctors most often [place] these monitors to diagnose arrhythmias. Arrhythmias are problems with the speed or rhythm of the heartbeat. The monitor is a small, portable device. You can wear one while you do your normal daily activities. This is usually used to diagnose what is causing palpitations/syncope (passing out).  Your physician has requested that you have an echocardiogram. Echocardiography is a painless test that uses sound waves to create images of your heart. It provides your doctor with information about the size and shape of your heart and how well your heart's chambers and valves are working. This procedure takes approximately one hour. There are no restrictions for this procedure.  Your physician has requested that you have an exercise stress myoview. For further information please visit [email or web address]. Please follow instruction sheet, as given.   Follow-Up: A follow up appointment has been scheduled for November 21,2019 at 9:40  Any Other Special Instructions Will Be Listed Below (If Applicable).     If you need a refill on your cardiac medications before your next appointment, please call your pharmacy.

## 2018-02-25 ENCOUNTER — Telehealth (HOSPITAL_COMMUNITY): Payer: Self-pay

## 2018-02-25 NOTE — Telephone Encounter (Signed)
Pt contacted with his instructions for his Nuclear Stress test on Thursday 02/27/2018. Pt stated he understood and would be here for his test. S.Willams EMTP

## 2018-02-27 ENCOUNTER — Ambulatory Visit (HOSPITAL_COMMUNITY): Payer: Medicare HMO | Attending: Internal Medicine

## 2018-02-27 DIAGNOSIS — R0602 Shortness of breath: Secondary | ICD-10-CM | POA: Insufficient documentation

## 2018-02-27 DIAGNOSIS — I480 Paroxysmal atrial fibrillation: Secondary | ICD-10-CM | POA: Insufficient documentation

## 2018-02-27 DIAGNOSIS — I25118 Atherosclerotic heart disease of native coronary artery with other forms of angina pectoris: Secondary | ICD-10-CM | POA: Diagnosis not present

## 2018-02-27 LAB — MYOCARDIAL PERFUSION IMAGING
CHL CUP NUCLEAR SDS: 1
CHL CUP NUCLEAR SRS: 0
CHL CUP NUCLEAR SSS: 1
CSEPPHR: 157 {beats}/min
Estimated workload: 8.5 METS
Exercise duration (min): 7 min
LV sys vol: 27 mL
LVDIAVOL: 72 mL (ref 62–150)
MPHR: 149 {beats}/min
RPE: 18
Rest HR: 57 {beats}/min
TID: 0.83

## 2018-02-27 MED ORDER — TECHNETIUM TC 99M TETROFOSMIN IV KIT
31.9000 | PACK | Freq: Once | INTRAVENOUS | Status: AC | PRN
  Administered 2018-02-27: 31.9 via INTRAVENOUS
  Filled 2018-02-27: qty 32

## 2018-02-27 MED ORDER — TECHNETIUM TC 99M TETROFOSMIN IV KIT
10.3000 | PACK | Freq: Once | INTRAVENOUS | Status: AC | PRN
  Administered 2018-02-27: 10.3 via INTRAVENOUS
  Filled 2018-02-27: qty 11

## 2018-02-28 ENCOUNTER — Other Ambulatory Visit: Payer: Self-pay

## 2018-02-28 ENCOUNTER — Ambulatory Visit (HOSPITAL_COMMUNITY): Payer: Medicare HMO | Attending: Cardiology

## 2018-02-28 ENCOUNTER — Ambulatory Visit (INDEPENDENT_AMBULATORY_CARE_PROVIDER_SITE_OTHER): Payer: Medicare HMO

## 2018-02-28 DIAGNOSIS — I480 Paroxysmal atrial fibrillation: Secondary | ICD-10-CM | POA: Diagnosis not present

## 2018-02-28 DIAGNOSIS — R0602 Shortness of breath: Secondary | ICD-10-CM | POA: Diagnosis not present

## 2018-02-28 DIAGNOSIS — I25118 Atherosclerotic heart disease of native coronary artery with other forms of angina pectoris: Secondary | ICD-10-CM | POA: Insufficient documentation

## 2018-03-10 DIAGNOSIS — Z23 Encounter for immunization: Secondary | ICD-10-CM | POA: Diagnosis not present

## 2018-03-26 ENCOUNTER — Ambulatory Visit: Payer: Medicare HMO | Admitting: Cardiovascular Disease

## 2018-03-29 ENCOUNTER — Telehealth: Payer: Self-pay | Admitting: Physician Assistant

## 2018-03-29 NOTE — Telephone Encounter (Signed)
72 y.o. male with a hx of CAD, CKD, HTN, Parox AF.  He was evaluated by Dr. Angelena Form recently for atrial fibrillation and placed on Eliquis.  He is wearing a monitor. I was contacted by [place] with [employer] [phone]) for several patient activated recordings.  These all showed AFib with HRs 90-150. I called the patient.  He was awoken this AM with symptoms of palpitations and felt he was back in atrial fibrillation.  He notes increased HR, chest pain and shortness of breath with any type of activity.  He is asymptomatic at rest. BP while I spoke with him on the phone was 120/80.  PLAN:   I recommended going to the ED for definitive management of his atrial fibrillation with RVR.  He preferred to hold off and notes his episodes of atrial fibrillation usually subside after a few hours.  I asked him to take an extra Labetalol 100 mg now.  I have advised him to go to the ED (at [facility]) if his symptoms persist or worsen.  I will ask for Dr. Camillia Herter RN to call him Monday to determine if he needs earlier follow up. Richardson Dopp, PA-C    03/29/2018 12:05 PM

## 2018-03-31 ENCOUNTER — Encounter: Payer: Self-pay | Admitting: Cardiovascular Disease

## 2018-03-31 ENCOUNTER — Ambulatory Visit: Payer: Medicare HMO | Admitting: Cardiovascular Disease

## 2018-03-31 VITALS — BP 138/80 | HR 55 | Ht 70.0 in | Wt 159.4 lb

## 2018-03-31 DIAGNOSIS — I480 Paroxysmal atrial fibrillation: Secondary | ICD-10-CM

## 2018-03-31 DIAGNOSIS — I2510 Atherosclerotic heart disease of native coronary artery without angina pectoris: Secondary | ICD-10-CM | POA: Diagnosis not present

## 2018-03-31 MED ORDER — RIVAROXABAN 15 MG PO TABS: 15.0000 mg | ORAL_TABLET | Freq: Every day | ORAL | 6 refills | Status: DC

## 2018-03-31 MED ORDER — RIVAROXABAN 15 MG PO TABS: 15.0000 mg | ORAL_TABLET | Freq: Every day | ORAL | 2 refills | Status: DC

## 2018-03-31 NOTE — Progress Notes (Signed)
Chief Complaint  Patient presents with  . Follow-up    CAD   History of Present Illness: 72 yo male with history of carotid artery disease, Chronic kidney disease, CAD, paroxysmal atrial fibrillation, HTN and hyperlipidemia here today for follow up. I saw him as a new patient 02/20/18 for evaluation of atrial fibrillation. He has been followed in the past by Dr. Johnsie Cancel. He had a cardiac cath in 2010 with Dr. Olevia Perches and was found to have mild CAD. He was suspected to have atheroemboli during the cath and had worsened renal insufficiency post cath. He had not been on coumadin. He has not been seen in our office since 2012. Last echo in our system was in 2012 and showed normal LV systolic function. There was grade 1 diastolic dysfunction. He had an AV fistula placed back in 2011 and this was removed in 2015 as he never progressed to ESRD. He woke up on 02/10/18 and felt weak and his heart was racing. He went in to see Laverna Peace, NP and was found to have atrial fibrillation with HR 100 bpm. He was started on Eliquis. Carotid artery dopplers 11/11/17 with 50-69% narrowing bilateral ICA. I arranged an echo on 02/28/18 which showed normal LV systolic function, CHEN=27-78%. There was trivial AI. Nuclear stress test 02/27/18 was low risk, no ischemia. A cardiac monitor was placed and he has been wearing this. He turned in his monitor today. He called our office Saturday AM with c/o his heart racing. The cardiac monitor during this time showed atrial fibrillation with RVR, rates around 130 bpm. He states this this started around 6 am and lasted for about 8 hours. He had no energy and felt terrible while he was in atrial fib. Resting heart rate in the high 50s on labetalol 200 mg po BID.   He is here today for follow up. He feels ok today. As above, he felt terrible when he was in atrial fibrillation. Denies chest pain, dyspnea, lower extremity edema, orthopnea, PND, dizziness, near syncope or syncope. He has also noted  a rash over his abdomen since starting the Eliquis.   Primary Care Physician: Lowella Dandy, NP   Past Medical History:  Diagnosis Date  . Allergy   . Arthritis    psoriatic arthritis   . Asthma   . Atheroembolism   . Atrial fibrillation ([place])   . CAD (coronary artery disease)    hx non obst  . Cancer ([place])    basal cell x3 , last one removed - 12/12/2013, R temple area of forehead  . Chronic kidney disease    renal stones - tx. /w lithotripsy  . Complication of anesthesia   . HLD (hyperlipidemia)   . HTN (hypertension)   . Hypoglycemia   . PONV (postoperative nausea and vomiting)   . Psoriasis   . Shortness of breath    after eating , if he gets active, he might have some SOB    Past Surgical History:  Procedure Laterality Date  . AV FISTULA PLACEMENT  07-2009   Left Brachiocephalic AVF  . LIGATION OF ARTERIOVENOUS  FISTULA Left 12/30/2013   Procedure: EXCISION OF LEFT ARM ARTERIOVENOUS  FISTULA;  Surgeon: Rosetta Posner, MD;  Location: [facility];  Service: Vascular;  Laterality: Left;  . LITHOTRIPSY    . skin cancer removed     multiple basal cell    Current Outpatient Medications  Medication Sig Dispense Refill  . Acetaminophen (APAP) 325 MG tablet Take  650 mg by mouth every 4 (four) hours as needed for pain or fever. Take 2    . amLODipine (NORVASC) 10 MG tablet Take 10 mg by mouth daily with breakfast.    . betamethasone dipropionate (DIPROLENE) 0.05 % cream Apply 1 application topically 2 (two) times daily as needed (for psoriasis).    . labetalol (NORMODYNE) 200 MG tablet TAKE 1 TABLET TWO TIMES A DAY    . montelukast (SINGULAIR) 10 MG tablet Take 10 mg by mouth at bedtime.    Marland Kitchen OVER THE COUNTER MEDICATION Calcium with Vitamin D 3 plus minerals. One tablet everyday.    Marland Kitchen OVER THE COUNTER MEDICATION Co Q 10, 200 mg daily.    . pravastatin (PRAVACHOL) 80 MG tablet Take 80 mg by mouth daily.    . Triamcinolone Acetonide (KENALOG IJ) Inject 1 application as directed every 6  (six) months.    . Rivaroxaban (XARELTO) 15 MG TABS tablet Take 1 tablet (15 mg total) by mouth daily with supper. 90 tablet 2   Current Facility-Administered Medications  Medication Dose Route Frequency Provider Last Rate Last Dose  . 0.9 %  sodium chloride infusion  500 mL Intravenous Once Jackquline Denmark, MD        Allergies  Allergen Reactions  . Hydrocodone Nausea Only  . Oxycodone Nausea And Vomiting  . Hydrochlorothiazide     anxious  . Hytrin [Terazosin Hcl]     Heart races  . Sulfa Antibiotics Itching    Social History   Socioeconomic History  . Marital status: Married    Spouse name: Not on file  . Number of children: 0  . Years of education: Not on file  . Highest education level: Not on file  Occupational History  . Occupation: Retired from a Engineer, materials  Social Needs  . Financial resource strain: Not on file  . Food insecurity:    Worry: Not on file    Inability: Not on file  . Transportation needs:    Medical: Not on file    Non-medical: Not on file  Tobacco Use  . Smoking status: Former Smoker    Last attempt to quit: 05/21/1970    Years since quitting: 47.8  . Smokeless tobacco: Never Used  . Tobacco comment: quit 40+ years ago   Substance and Sexual Activity  . Alcohol use: No  . Drug use: No  . Sexual activity: Not on file  Lifestyle  . Physical activity:    Days per week: Not on file    Minutes per session: Not on file  . Stress: Not on file  Relationships  . Social connections:    Talks on phone: Not on file    Gets together: Not on file    Attends religious service: Not on file    Active member of club or organization: Not on file    Attends meetings of clubs or organizations: Not on file    Relationship status: Not on file  . Intimate partner violence:    Fear of current or ex partner: Not on file    Emotionally abused: Not on file    Physically abused: Not on file    Forced sexual activity: Not on file  Other Topics Concern  . Not  on file  Social History Narrative   Married, lives with wife; Loss adjuster, chartered.     Family History  Problem Relation Age of Onset  . Cancer Mother   . Heart disease Father  Before age 29  . Asthma Father   . Heart disease Brother        After 54 years of age  . Coronary artery disease Unknown        family hx  . Colon cancer Neg Hx   . Esophageal cancer Neg Hx   . Liver cancer Neg Hx   . Pancreatic cancer Neg Hx   . Rectal cancer Neg Hx   . Stomach cancer Neg Hx     Review of Systems:  As stated in the HPI and otherwise negative.   BP 138/80   Pulse (!) 55   Ht 5\' 10"  (1.778 m)   Wt 159 lb 6.4 oz (72.3 kg)   SpO2 97%   BMI 22.87 kg/m   Physical Examination: General: Well developed, well nourished, NAD  HEENT: OP clear, mucus membranes moist  SKIN: warm, dry. No rashes. Neuro: No focal deficits  Musculoskeletal: Muscle strength 5/5 all ext  Psychiatric: Mood and affect normal  Neck: No JVD, no carotid bruits, no thyromegaly, no lymphadenopathy.  Lungs:Clear bilaterally, no wheezes, rhonci, crackles Cardiovascular: Regular rhythm, brady. No murmurs, gallops or rubs. Abdomen:Soft. Bowel sounds present. Non-tender.  Extremities: No lower extremity edema. Pulses are 2 + in the bilateral DP/PT.  Echo 02/28/18: Left ventricle: The cavity size was normal. Wall thickness was   increased in a pattern of mild LVH. There was mild focal basal   hypertrophy of the septum. Systolic function was normal. The   estimated ejection fraction was in the range of 55% to 60%. Wall   motion was normal; there were no regional wall motion   abnormalities. Doppler parameters are consistent with abnormal   left ventricular relaxation (grade 1 diastolic dysfunction). - Aortic valve: There was trivial regurgitation. - Aortic root: The aortic root was mildly dilated. - Mitral valve: Calcified annulus.  Impressions:  - Normal LV systolic function; mild LVH; mild diastolic    dysfunction; trace AI; mildly dilated aortic root.  EKG:  EKG is ordered today. The ekg ordered today demonstrates sinus brady, rate 55 bpm  Recent Labs: No results found for requested labs within last 8760 hours.   Lipid Panel    Component Value Date/Time   CHOL (H) 05/18/2009 0550    237        ATP III CLASSIFICATION:  <200     mg/dL   Desirable  200-239  mg/dL   Borderline High  >=240    mg/dL   High          TRIG 128 05/18/2009 0550   HDL 38 (L) 05/18/2009 0550   CHOLHDL 6.2 05/18/2009 0550   VLDL 26 05/18/2009 0550   LDLCALC (H) 05/18/2009 0550    173        Total Cholesterol/HDL:CHD Risk Coronary Heart Disease Risk Table                     Men   Women  1/2 Average Risk   3.4   3.3  Average Risk       5.0   4.4  2 X Average Risk   9.6   7.1  3 X Average Risk  23.4   11.0        Use the calculated Patient Ratio above and the CHD Risk Table to determine the patient's CHD Risk.        ATP III CLASSIFICATION (LDL):  <100     mg/dL   Optimal  100-129  mg/dL   Near or Above                    Optimal  130-159  mg/dL   Borderline  160-189  mg/dL   High  >190     mg/dL   Very High     Wt Readings from Last 3 Encounters:  03/31/18 159 lb 6.4 oz (72.3 kg)  02/27/18 154 lb (69.9 kg)  02/20/18 154 lb 12.8 oz (70.2 kg)     Other studies Reviewed: Additional studies/ records that were reviewed today include: old records Review of the above records demonstrates:    Assessment and Plan:   1. Paroxysmal atrial fibrillation: He is in sinus brady today. Echo with normal LV systolic function. Cardiac monitor strips reviewed and he was in atrial fibrillation with RVR 2 days ago. He is taking labetalol 200 mg po BID. I have discussed changing to a different beta blocker or changing to Cardizem. It will be difficult to titrate his medications given his bradycardia when he is in sinus. He is symptomatic when he is in atrial fibrillation. I suspect that he may need an  anti-arrhythmic drug. For now I will continue his labetalol and refer him to the EP clinic. He is instructed to sit down and take an extra 1/2 of his labetalol if he has recurrence of atrial fib with RVR. If his heart rate remains elevated, he should seek medical attention. Will change Eliquis to Xarelto today given the new rash that started when Eliquis was started. Xarelto will be 15 mg per day given his CrCl of 37.     2. CAD without angina: No chest pain. Stress test October 2019 was low risk.   3. Carotid artery disease: Moderate bilateral disease by dopplers this summer. Will need to be followed.   4. HTN: BP is controlled. No changes   Current medicines are reviewed at length with the patient today.  The patient does not have concerns regarding medicines.  The following changes have been made:  no change  Labs/ tests ordered today include:   Orders Placed This Encounter  Procedures  . Ambulatory referral to Cardiac Electrophysiology  . EKG 12-Lead     Disposition:   FU with me in 6 months. Referral to EP clinic.    Signed, Lauree Chandler, MD 03/31/2018 4:08 PM    [facility] [place], [place], [place]  [address] Phone: [phone]; Fax: [phone]

## 2018-03-31 NOTE — Patient Instructions (Signed)
Medication Instructions:  Your physician has recommended you make the following change in your medication: Stop Eliquis. Start Xarelto 15 mg by mouth daily with evening meal.    If you need a refill on your cardiac medications before your next appointment, please call your pharmacy.   Lab work: none If you have labs (blood work) drawn today and your tests are completely normal, you will receive your results only by: Marland Kitchen MyChart Message (if you have MyChart) OR . A paper copy in the mail If you have any lab test that is abnormal or we need to change your treatment, we will call you to review the results.  Testing/Procedures: none  Follow-Up: You have been referred to Electrophysiology.  Please schedule patient for new patient appointment     At [facility], you and your health needs are our priority.  As part of our continuing mission to provide you with exceptional heart care, we have created designated Provider Care Teams.  These Care Teams include your primary Cardiologist (physician) and Advanced Practice Providers (APPs -  Physician Assistants and Nurse Practitioners) who all work together to provide you with the care you need, when you need it. You will need a follow up appointment in 6 months.  Please call our office 2 months in advance to schedule this appointment.  You may see Lauree Chandler, MD or one of the following Advanced Practice Providers on your designated Care Team:   Mandan, PA-C Melina Copa, PA-C . Ermalinda Barrios, PA-C  Any Other Special Instructions Will Be Listed Below (If Applicable).

## 2018-03-31 NOTE — Telephone Encounter (Signed)
Jack [name], can you check on him today? I could see him this afternoon. Thanks, chris

## 2018-03-31 NOTE — Telephone Encounter (Signed)
I spoke with pt. He would like to come in for office visit today.  He states heart rate has settled down some but not back to normal.  Has not checked this morning. Is feeling OK.  I scheduled him to see Dr. Angelena Form today at 3:20

## 2018-04-10 ENCOUNTER — Ambulatory Visit: Payer: Medicare HMO | Admitting: Cardiovascular Disease

## 2018-04-14 ENCOUNTER — Ambulatory Visit: Payer: Medicare HMO | Admitting: Internal Medicine

## 2018-04-14 ENCOUNTER — Encounter: Payer: Self-pay | Admitting: Internal Medicine

## 2018-04-14 VITALS — BP 138/82 | HR 61 | Ht 70.0 in | Wt 159.0 lb

## 2018-04-14 DIAGNOSIS — I10 Essential (primary) hypertension: Secondary | ICD-10-CM | POA: Diagnosis not present

## 2018-04-14 DIAGNOSIS — I480 Paroxysmal atrial fibrillation: Secondary | ICD-10-CM | POA: Diagnosis not present

## 2018-04-14 MED ORDER — FLECAINIDE ACETATE 50 MG PO TABS: 50.0000 mg | ORAL_TABLET | Freq: Two times a day (BID) | ORAL | 0 refills | Status: DC

## 2018-04-14 MED ORDER — FLECAINIDE ACETATE 50 MG PO TABS: 50.0000 mg | ORAL_TABLET | Freq: Two times a day (BID) | ORAL | 3 refills | Status: DC

## 2018-04-14 NOTE — Patient Instructions (Addendum)
Medication Instructions:  Your physician has recommended you make the following change in your medication:   1.  Start taking flecainide 50 mg--Take one tablet by mouth twice a day  Labwork: None ordered.  Testing/Procedures: None ordered.  Follow-Up: Your physician wants you to follow-up in: 6 weeks with Dr. Rayann Heman.     Any Other Special Instructions Will Be Listed Below (If Applicable).  If you need a refill on your cardiac medications before your next appointment, please call your pharmacy.   Flecainide tablets What is this medicine? FLECAINIDE (FLEK a nide) is an antiarrhythmic drug. This medicine is used to prevent irregular heart rhythm. It can also slow down fast heartbeats called tachycardia. This medicine may be used for other purposes; ask your health care provider or pharmacist if you have questions. COMMON BRAND NAME(S): Tambocor What should I tell my health care provider before I take this medicine? They need to know if you have any of these conditions: -abnormal levels of potassium in the blood -heart disease including heart rhythm and heart rate problems -kidney or liver disease -recent heart attack -an unusual or allergic reaction to flecainide, local anesthetics, other medicines, foods, dyes, or preservatives -pregnant or trying to get pregnant -breast-feeding How should I use this medicine? Take this medicine by mouth with a glass of water. Follow the directions on the prescription label. You can take this medicine with or without food. Take your doses at regular intervals. Do not take your medicine more often than directed. Do not stop taking this medicine suddenly. This may cause serious, heart-related side effects. If your doctor wants you to stop the medicine, the dose may be slowly lowered over time to avoid any side effects. Talk to your pediatrician regarding the use of this medicine in children. While this drug may be prescribed for children as young as 1  year of age for selected conditions, precautions do apply. Overdosage: If you think you have taken too much of this medicine contact a poison control center or emergency room at once. NOTE: This medicine is only for you. Do not share this medicine with others. What if I miss a dose? If you miss a dose, take it as soon as you can. If it is almost time for your next dose, take only that dose. Do not take double or extra doses. What may interact with this medicine? Do not take this medicine with any of the following medications: -amoxapine -arsenic trioxide -certain antibiotics like clarithromycin, erythromycin, gatifloxacin, gemifloxacin, levofloxacin, moxifloxacin, sparfloxacin, or troleandomycin -certain antidepressants called tricyclic antidepressants like amitriptyline, imipramine, or nortriptyline -certain medicines to control heart rhythm like disopyramide, dofetilide, encainide, moricizine, procainamide, propafenone, and quinidine -cisapride -cyclobenzaprine -delavirdine -droperidol -haloperidol -hawthorn -imatinib -levomethadyl -maprotiline -medicines for malaria like chloroquine and halofantrine -pentamidine -phenothiazines like chlorpromazine, mesoridazine, prochlorperazine, thioridazine -pimozide -quinine -ranolazine -ritonavir -sertindole -ziprasidone This medicine may also interact with the following medications: -cimetidine -medicines for angina or high blood pressure -medicines to control heart rhythm like amiodarone and digoxin This list may not describe all possible interactions. Give your health care provider a list of all the medicines, herbs, non-prescription drugs, or dietary supplements you use. Also tell them if you smoke, drink alcohol, or use illegal drugs. Some items may interact with your medicine. What should I watch for while using this medicine? Visit your doctor or health care professional for regular checks on your progress. Because your condition and  the use of this medicine carries some risk, it is a good idea  to carry an identification card, necklace or bracelet with details of your condition, medications and doctor or health care professional. Check your blood pressure and pulse rate regularly. Ask your health care professional what your blood pressure and pulse rate should be, and when you should contact him or her. Your doctor or health care professional also may schedule regular blood tests and electrocardiograms to check your progress. You may get drowsy or dizzy. Do not drive, use machinery, or do anything that needs mental alertness until you know how this medicine affects you. Do not stand or sit up quickly, especially if you are an older patient. This reduces the risk of dizzy or fainting spells. Alcohol can make you more dizzy, increase flushing and rapid heartbeats. Avoid alcoholic drinks. What side effects may I notice from receiving this medicine? Side effects that you should report to your doctor or health care professional as soon as possible: -chest pain, continued irregular heartbeats -difficulty breathing -swelling of the legs or feet -trembling, shaking -unusually weak or tired Side effects that usually do not require medical attention (report to your doctor or health care professional if they continue or are bothersome): -blurred vision -constipation -headache -nausea, vomiting -stomach pain This list may not describe all possible side effects. Call your doctor for medical advice about side effects. You may report side effects to FDA at 1-800-FDA-1088. Where should I keep my medicine? Keep out of the reach of children. Store at room temperature between 15 and 30 degrees C (59 and 86 degrees F). Protect from light. Keep container tightly closed. Throw away any unused medicine after the expiration date. NOTE: This sheet is a summary. It may not cover all possible information. If you have questions about this medicine, talk  to your doctor, pharmacist, or health care provider.  2018 Elsevier/Gold Standard (2007-09-10 16:46:09)

## 2018-04-14 NOTE — Progress Notes (Signed)
Electrophysiology Office Note   Date:  04/14/2018   ID:  Jack [name] 01/02/1946, MRN [id number]  PCP:  Lowella Dandy, NP  Cardiologist:  Dr Angelena Form Primary Electrophysiologist: Thompson Grayer, MD    CC: afib   History of Present Illness: Jack [name] is a 72 y.o. male who presents today for electrophysiology evaluation.   He is referred by Dr Angelena Form for EP consultation regarding atrial fibrillation.  He thinks that he has had afib for about 3 years.  Episodes were initially rare.  They have increased in frequency and duration since that time.  He reports that his afib was noticed 02/10/18 after presenting to primary care with fatigue.  He was noted to have afib at that time.  He was placed on eliquis.  He wore an event monitor which showed afib with elevation in ventricular rates.  He has not yet tried AAD therapy. Due to renal failure, his eliquis has been switched to renally dosed xarelto.  He reports   He has a h/o CAD.  He underwent cath 2010 which revealed mild CAD.  He had atheroembolism (presumed) post cath with progressive renal failure.  He had an AV fistula placed which was eventually removed in 2015 as his renal failure did not worsen.  Today, he denies symptoms of palpitations, chest pain, shortness of breath, orthopnea, PND, lower extremity edema, claudication, dizziness, presyncope, syncope, bleeding, or neurologic sequela. The patient is tolerating medications without difficulties and is otherwise without complaint today.    Past Medical History:  Diagnosis Date  . Allergy   . Arthritis    psoriatic arthritis   . Asthma   . Atheroembolism   . CAD (coronary artery disease)    hx non obst  . Cancer ([place])    basal cell x3 , last one removed - 12/12/2013, R temple area of forehead  . Chronic kidney disease    renal stones - tx. /w lithotripsy  . Complication of anesthesia   . HLD (hyperlipidemia)   . HTN (hypertension)   . Hypoglycemia   . Paroxysmal atrial  fibrillation (HCC)   . PONV (postoperative nausea and vomiting)   . Psoriasis   . Shortness of breath    after eating , if he gets active, he might have some SOB   Past Surgical History:  Procedure Laterality Date  . AV FISTULA PLACEMENT  07-2009   Left Brachiocephalic AVF  . LIGATION OF ARTERIOVENOUS  FISTULA Left 12/30/2013   Procedure: EXCISION OF LEFT ARM ARTERIOVENOUS  FISTULA;  Surgeon: Rosetta Posner, MD;  Location: [facility];  Service: Vascular;  Laterality: Left;  . LITHOTRIPSY    . skin cancer removed     multiple basal cell     Current Outpatient Medications  Medication Sig Dispense Refill  . Acetaminophen (APAP) 325 MG tablet Take 650 mg by mouth every 4 (four) hours as needed for pain or fever. Take 2    . amLODipine (NORVASC) 10 MG tablet Take 10 mg by mouth daily with breakfast.    . betamethasone dipropionate (DIPROLENE) 0.05 % cream Apply 1 application topically 2 (two) times daily as needed (for psoriasis).    . labetalol (NORMODYNE) 200 MG tablet TAKE 1 TABLET TWO TIMES A DAY    . montelukast (SINGULAIR) 10 MG tablet Take 10 mg by mouth at bedtime.    Marland Kitchen OVER THE COUNTER MEDICATION Calcium with Vitamin D 3 plus minerals. One tablet everyday.    Marland Kitchen OVER THE  COUNTER MEDICATION Co Q 10, 200 mg daily.    . pravastatin (PRAVACHOL) 80 MG tablet Take 80 mg by mouth daily.    . Rivaroxaban (XARELTO) 15 MG TABS tablet Take 1 tablet (15 mg total) by mouth daily with supper. 90 tablet 2  . Triamcinolone Acetonide (KENALOG IJ) Inject 1 application as directed every 6 (six) months.     Current Facility-Administered Medications  Medication Dose Route Frequency Provider Last Rate Last Dose  . 0.9 %  sodium chloride infusion  500 mL Intravenous Once Jackquline Denmark, MD        Allergies:   Hydrocodone; Oxycodone; Hydrochlorothiazide; Hytrin [terazosin hcl]; and Sulfa antibiotics   Social History:  The patient  reports that he quit smoking about 47 years ago. He has never used smokeless  tobacco. He reports that he does not drink alcohol or use drugs.   Family History:  The patient's  family history includes Asthma in his father; Cancer in his mother; Coronary artery disease in his unknown relative; Heart disease in his brother and father.    ROS:  Please see the history of present illness.   All other systems are personally reviewed and negative.    PHYSICAL EXAM: VS:  BP 138/82   Pulse 61   Ht 5\' 10"  (1.778 m)   Wt 159 lb (72.1 kg)   SpO2 99%   BMI 22.81 kg/m  , BMI Body mass index is 22.81 kg/m. GEN: Well nourished, well developed, in no acute distress  HEENT: normal  Neck: no JVD, carotid bruits, or masses Cardiac: RRR; no murmurs, rubs, or gallops,no edema  Respiratory:  clear to auscultation bilaterally, normal work of breathing GI: soft, nontender, nondistended, + BS MS: no deformity or atrophy  Skin: warm and dry  Neuro:  Strength and sensation are intact Psych: euthymic mood, full affect  EKG:  EKG is ordered today. The ekg ordered today is personally reviewed and shows sinus rhythm 61 bpm, PR 164 msec, QRS 86 msec, Qtc 398 msec   Recent Labs: No results found for requested labs within last 8760 hours.  personally reviewed   Lipid Panel     Component Value Date/Time   CHOL (H) 05/18/2009 0550    237        ATP III CLASSIFICATION:  <200     mg/dL   Desirable  200-239  mg/dL   Borderline High  >=240    mg/dL   High          TRIG 128 05/18/2009 0550   HDL 38 (L) 05/18/2009 0550   CHOLHDL 6.2 05/18/2009 0550   VLDL 26 05/18/2009 0550   LDLCALC (H) 05/18/2009 0550    173        Total Cholesterol/HDL:CHD Risk Coronary Heart Disease Risk Table                     Men   Women  1/2 Average Risk   3.4   3.3  Average Risk       5.0   4.4  2 X Average Risk   9.6   7.1  3 X Average Risk  23.4   11.0        Use the calculated Patient Ratio above and the CHD Risk Table to determine the patient's CHD Risk.        ATP III CLASSIFICATION  (LDL):  <100     mg/dL   Optimal  100-129  mg/dL   Near or  Above                    Optimal  130-159  mg/dL   Borderline  160-189  mg/dL   High  >190     mg/dL   Very High   personally reviewed   Wt Readings from Last 3 Encounters:  04/14/18 159 lb (72.1 kg)  03/31/18 159 lb 6.4 oz (72.3 kg)  02/27/18 154 lb (69.9 kg)      Other studies personally reviewed: Additional studies/ records that were reviewed today include: Dr Camillia Herter notes, prior event monitor, echo  Review of the above records today demonstrates: as above  Echo 02/28/18 reveals EF 55%, mild focal basal septal hypertrophy, mild AI, mild diastolic dysfunction  Cath 05/18/09 reveals nonobstructive CAD,  EF 65%  ASSESSMENT AND PLAN:  1.  Paroxysmal atrial fibrillation On xarelto In sinus rhythm today.  He has not tried AAD therapy We discussed options at length today.  Given renal failure, I am not excited about multaq, tikosyn or sotalol.  He would like to avoid amiodarone.  I have reviewed his cath report from 2010 at length.  As he has not had MI and does not have obstructive CAD, I think that flecainide is his best medical option.  He is also very clear that he is not interested in ablation at this time.  Start flecainide 50mg  BID today.  2. HTN Stable No change required today  3. CRI Stable No change required today  Follow-up:  Return in 6 weeks for additional evaluation  Current medicines are reviewed at length with the patient today.   The patient does not have concerns regarding his medicines.  The following changes were made today:  none  Labs/ tests ordered today include:  Orders Placed This Encounter  Procedures  . EKG 12-Lead     Signed, Thompson Grayer, MD  04/14/2018 3:04 PM     [address] [place] [address] [phone] (office) [phone] (fax)

## 2018-05-16 DIAGNOSIS — E785 Hyperlipidemia, unspecified: Secondary | ICD-10-CM | POA: Diagnosis not present

## 2018-05-16 DIAGNOSIS — H539 Unspecified visual disturbance: Secondary | ICD-10-CM | POA: Diagnosis not present

## 2018-05-16 DIAGNOSIS — Z9181 History of falling: Secondary | ICD-10-CM | POA: Diagnosis not present

## 2018-05-16 DIAGNOSIS — R972 Elevated prostate specific antigen [PSA]: Secondary | ICD-10-CM | POA: Diagnosis not present

## 2018-05-16 DIAGNOSIS — N184 Chronic kidney disease, stage 4 (severe): Secondary | ICD-10-CM | POA: Diagnosis not present

## 2018-05-16 DIAGNOSIS — R7309 Other abnormal glucose: Secondary | ICD-10-CM | POA: Diagnosis not present

## 2018-05-16 DIAGNOSIS — I10 Essential (primary) hypertension: Secondary | ICD-10-CM | POA: Diagnosis not present

## 2018-05-16 DIAGNOSIS — Z1331 Encounter for screening for depression: Secondary | ICD-10-CM | POA: Diagnosis not present

## 2018-05-16 DIAGNOSIS — J309 Allergic rhinitis, unspecified: Secondary | ICD-10-CM | POA: Diagnosis not present

## 2018-05-26 ENCOUNTER — Encounter (INDEPENDENT_AMBULATORY_CARE_PROVIDER_SITE_OTHER): Payer: Self-pay

## 2018-05-26 ENCOUNTER — Ambulatory Visit (INDEPENDENT_AMBULATORY_CARE_PROVIDER_SITE_OTHER): Payer: PPO | Admitting: Internal Medicine

## 2018-05-26 ENCOUNTER — Encounter: Payer: Self-pay | Admitting: Internal Medicine

## 2018-05-26 VITALS — BP 110/72 | HR 49 | Ht 70.0 in | Wt 159.0 lb

## 2018-05-26 DIAGNOSIS — I10 Essential (primary) hypertension: Secondary | ICD-10-CM

## 2018-05-26 DIAGNOSIS — I480 Paroxysmal atrial fibrillation: Secondary | ICD-10-CM

## 2018-05-26 NOTE — Progress Notes (Signed)
PCP: Lowella Dandy, NP Primary Cardiologist: Dr Angelena Form Primary EP: Dr Rayann Heman  Jack [name] is a 73 y.o. male who presents today for routine electrophysiology followup.  Since last being seen in our clinic, the patient reports doing very well. He has had no afib since starting the flecainide.  He is tolerating this medicine and is pleased with results. Today, he denies symptoms of palpitations, chest pain, shortness of breath,  lower extremity edema, dizziness, presyncope, or syncope.  The patient is otherwise without complaint today.   Past Medical History:  Diagnosis Date  . Allergy   . Arthritis    psoriatic arthritis   . Asthma   . Atheroembolism   . CAD (coronary artery disease)    hx non obst  . Cancer ([place])    basal cell x3 , last one removed - 12/12/2013, R temple area of forehead  . Chronic kidney disease    renal stones - tx. /w lithotripsy  . Complication of anesthesia   . HLD (hyperlipidemia)   . HTN (hypertension)   . Hypoglycemia   . Paroxysmal atrial fibrillation (HCC)   . PONV (postoperative nausea and vomiting)   . Psoriasis   . Shortness of breath    after eating , if he gets active, he might have some SOB   Past Surgical History:  Procedure Laterality Date  . AV FISTULA PLACEMENT  07-2009   Left Brachiocephalic AVF  . LIGATION OF ARTERIOVENOUS  FISTULA Left 12/30/2013   Procedure: EXCISION OF LEFT ARM ARTERIOVENOUS  FISTULA;  Surgeon: Rosetta Posner, MD;  Location: [facility];  Service: Vascular;  Laterality: Left;  . LITHOTRIPSY    . skin cancer removed     multiple basal cell    ROS- all systems are reviewed and negatives except as per HPI above  Current Outpatient Medications  Medication Sig Dispense Refill  . Acetaminophen (APAP) 325 MG tablet Take 650 mg by mouth every 4 (four) hours as needed for pain or fever. Take 2    . amLODipine (NORVASC) 10 MG tablet Take 10 mg by mouth daily with breakfast.    . betamethasone dipropionate (DIPROLENE) 0.05 %  cream Apply 1 application topically 2 (two) times daily as needed (for psoriasis).    . flecainide (TAMBOCOR) 50 MG tablet Take 1 tablet (50 mg total) by mouth 2 (two) times daily. 180 tablet 3  . labetalol (NORMODYNE) 200 MG tablet TAKE 1 TABLET TWO TIMES A DAY    . montelukast (SINGULAIR) 10 MG tablet Take 10 mg by mouth at bedtime.    Marland Kitchen OVER THE COUNTER MEDICATION Calcium with Vitamin D 3 plus minerals. One tablet everyday.    Marland Kitchen OVER THE COUNTER MEDICATION Co Q 10, 200 mg daily.    . pravastatin (PRAVACHOL) 80 MG tablet Take 80 mg by mouth daily.    . Rivaroxaban (XARELTO) 15 MG TABS tablet Take 1 tablet (15 mg total) by mouth daily with supper. 90 tablet 2  . Triamcinolone Acetonide (KENALOG IJ) Inject 1 application as directed every 6 (six) months.     Current Facility-Administered Medications  Medication Dose Route Frequency Provider Last Rate Last Dose  . 0.9 %  sodium chloride infusion  500 mL Intravenous Once Jackquline Denmark, MD        Physical Exam: Vitals:   05/26/18 1248  BP: 110/72  Pulse: (!) 49  SpO2: 99%  Weight: 159 lb (72.1 kg)  Height: 5\' 10"  (1.778 m)    GEN-  The patient is well appearing, alert and oriented x 3 today.   Head- normocephalic, atraumatic Eyes-  Sclera clear, conjunctiva pink Ears- hearing intact Oropharynx- clear Lungs- Clear to ausculation bilaterally, normal work of breathing Heart- Regular rate and rhythm, no murmurs, rubs or gallops, PMI not laterally displaced GI- soft, NT, ND, + BS Extremities- no clubbing, cyanosis, or edema  Wt Readings from Last 3 Encounters:  05/26/18 159 lb (72.1 kg)  04/14/18 159 lb (72.1 kg)  03/31/18 159 lb 6.4 oz (72.3 kg)    EKG tracing ordered today is personally reviewed and shows sinus rhythm 49 bpm, PR 182 msec, QRS 96 msec, Qtc 401 msec,   Assessment and Plan:  1. Paroxysmal atrial fibrillation Doing well with low dose flecainide.  He wishes to make no changes today On xarelto  2. htn Stable No  change required today  3. CRI Stable No change required today  Return in 3-4 months  Thompson Grayer MD, [facility] 05/26/2018 12:55 PM

## 2018-05-26 NOTE — Patient Instructions (Addendum)
Medication Instructions:  Your physician recommends that you continue on your current medications as directed. Please refer to the Current Medication list given to you today.  Labwork: None ordered.  Testing/Procedures: None ordered.  Follow-Up: Your physician wants you to follow-up in: 4 months with Dr. Rayann Heman.      Any Other Special Instructions Will Be Listed Below (If Applicable).  If you need a refill on your cardiac medications before your next appointment, please call your pharmacy.

## 2018-06-13 DIAGNOSIS — R972 Elevated prostate specific antigen [PSA]: Secondary | ICD-10-CM | POA: Diagnosis not present

## 2018-06-20 DIAGNOSIS — R972 Elevated prostate specific antigen [PSA]: Secondary | ICD-10-CM | POA: Diagnosis not present

## 2018-07-03 ENCOUNTER — Other Ambulatory Visit: Payer: Self-pay | Admitting: *Deleted

## 2018-07-03 MED ORDER — RIVAROXABAN 15 MG PO TABS: 15.0000 mg | ORAL_TABLET | Freq: Every day | ORAL | 1 refills | Status: DC

## 2018-07-03 MED ORDER — FLECAINIDE ACETATE 50 MG PO TABS: 50.0000 mg | ORAL_TABLET | Freq: Two times a day (BID) | ORAL | 3 refills | Status: DC

## 2018-07-03 NOTE — Telephone Encounter (Signed)
Pt last saw Dr Rayann Heman on 05/26/18, last labs 05/16/18 Cr 1.94, age 73, weight 72.1kg, CrCl 35.62, based on CrCl pt is on appropriate dosage of Xarelto 15mg  QD.  Will refill rx.

## 2018-07-14 DIAGNOSIS — R21 Rash and other nonspecific skin eruption: Secondary | ICD-10-CM | POA: Diagnosis not present

## 2018-07-14 DIAGNOSIS — Z6822 Body mass index (BMI) 22.0-22.9, adult: Secondary | ICD-10-CM | POA: Diagnosis not present

## 2018-07-31 DIAGNOSIS — Z6822 Body mass index (BMI) 22.0-22.9, adult: Secondary | ICD-10-CM | POA: Diagnosis not present

## 2018-07-31 DIAGNOSIS — R21 Rash and other nonspecific skin eruption: Secondary | ICD-10-CM | POA: Diagnosis not present

## 2018-09-06 DIAGNOSIS — R21 Rash and other nonspecific skin eruption: Secondary | ICD-10-CM | POA: Diagnosis not present

## 2018-09-26 ENCOUNTER — Telehealth: Payer: Self-pay

## 2018-09-26 NOTE — Telephone Encounter (Signed)
Left message regarding appt on 09/29/18. 

## 2018-09-29 ENCOUNTER — Ambulatory Visit: Payer: PPO | Admitting: Internal Medicine

## 2018-09-30 DIAGNOSIS — T7840XA Allergy, unspecified, initial encounter: Secondary | ICD-10-CM | POA: Diagnosis not present

## 2018-09-30 DIAGNOSIS — D485 Neoplasm of uncertain behavior of skin: Secondary | ICD-10-CM | POA: Diagnosis not present

## 2018-09-30 DIAGNOSIS — C44319 Basal cell carcinoma of skin of other parts of face: Secondary | ICD-10-CM | POA: Diagnosis not present

## 2018-09-30 DIAGNOSIS — D225 Melanocytic nevi of trunk: Secondary | ICD-10-CM | POA: Diagnosis not present

## 2018-10-02 DIAGNOSIS — I4891 Unspecified atrial fibrillation: Secondary | ICD-10-CM | POA: Diagnosis not present

## 2018-10-02 DIAGNOSIS — E559 Vitamin D deficiency, unspecified: Secondary | ICD-10-CM | POA: Diagnosis not present

## 2018-10-02 DIAGNOSIS — R972 Elevated prostate specific antigen [PSA]: Secondary | ICD-10-CM | POA: Diagnosis not present

## 2018-10-02 DIAGNOSIS — I10 Essential (primary) hypertension: Secondary | ICD-10-CM | POA: Diagnosis not present

## 2018-10-02 DIAGNOSIS — Z79899 Other long term (current) drug therapy: Secondary | ICD-10-CM | POA: Diagnosis not present

## 2018-10-02 DIAGNOSIS — I2510 Atherosclerotic heart disease of native coronary artery without angina pectoris: Secondary | ICD-10-CM | POA: Diagnosis not present

## 2018-10-02 DIAGNOSIS — J309 Allergic rhinitis, unspecified: Secondary | ICD-10-CM | POA: Diagnosis not present

## 2018-10-02 DIAGNOSIS — E785 Hyperlipidemia, unspecified: Secondary | ICD-10-CM | POA: Diagnosis not present

## 2018-10-02 DIAGNOSIS — N184 Chronic kidney disease, stage 4 (severe): Secondary | ICD-10-CM | POA: Diagnosis not present

## 2018-10-02 DIAGNOSIS — Z6822 Body mass index (BMI) 22.0-22.9, adult: Secondary | ICD-10-CM | POA: Diagnosis not present

## 2018-10-06 ENCOUNTER — Telehealth: Payer: Self-pay | Admitting: Internal Medicine

## 2018-10-06 NOTE — Telephone Encounter (Signed)
Returned call to Pt/wife.  Per Pt/wife, Pt was taking Eliquis end of last year, but was changed to Xarelto by Dr. Angelena Form d/t Pt c/o of rash.  Then a few weeks after changing to Xarelto Pt was started on flecainide.  Per family, Pt has had continuous rashes since that time, has had 2 Kenolog injections and a round of steroids that helped for a few weeks then rash would reoccur.  Pt/wife want to know if Xarelto and/or flecainide could be causing rash.  Will have pharmacy review.

## 2018-10-06 NOTE — Telephone Encounter (Signed)
Any medication has the potential for allergic skin reaction. At this time os difficult to determine exact culprit d/t multiple medication changes in short period of time.  Will recommend:  Stop Xarelto START Pradaxa 150mg  twice daily (1st dose 24 hours after last dose of Xarelto)

## 2018-10-06 NOTE — Telephone Encounter (Signed)
Returned call to Pt.  Gave Pt pharmacy recommendations.  This nurse advised Pt call his insurance first to be sure that Pradaxa is covered and make sure they can afford monthly payments.    Pt to call insurance company.  Will call back later today.

## 2018-10-06 NOTE — Telephone Encounter (Signed)
New message:    Patient wife calling concering the patient having a rash and would like to know if the medications changes are causing this. Please call patient wife back.

## 2018-10-06 NOTE — Telephone Encounter (Signed)
Returned call to Pt.  Pt/family called their insurance.  Pradaxa is not normally covered by their insurance, but representative advised they could file for an exception (?) for coverage.  Per Pt the insurance is faxing office some paperwork.  Will make outreach to patient assistance.  Advised family would let them know what can be arranged.

## 2018-10-07 ENCOUNTER — Telehealth: Payer: Self-pay

## 2018-10-07 ENCOUNTER — Telehealth: Payer: Self-pay | Admitting: Internal Medicine

## 2018-10-07 DIAGNOSIS — L309 Dermatitis, unspecified: Secondary | ICD-10-CM | POA: Diagnosis not present

## 2018-10-07 DIAGNOSIS — L299 Pruritus, unspecified: Secondary | ICD-10-CM | POA: Diagnosis not present

## 2018-10-07 NOTE — Telephone Encounter (Signed)
New Message    Jack [name] is calling and needed prior Authorization on a medication    Please call

## 2018-10-07 NOTE — Telephone Encounter (Signed)
Because the pt developed a rash shortly after starting Eliquis he was switched to Xarelto but the Rash did not improve so pharmacy recommends Pradaxa 150 mg BID. Per the pt after, calling his INS provider, Pradaxa is not covered under his plan and a non-formulary exception will need to be done.  I have done a Pradaxa non-formulary exception through covermymeds. Key: [id number]

## 2018-10-08 NOTE — Telephone Encounter (Signed)
**Note De-Identified Jack [name] Obfuscation** Per Ivin Booty a pharmacist at [facility] the pts Pradaxa Non-formulary exception has been approved. Approval is good until 05/21/2019.  Will forward note to Dr Bonita Quin nurse to update med list and to send to pharmacy to fill.

## 2018-10-08 NOTE — Telephone Encounter (Signed)
**Note De-Identified Corey Caulfield Obfuscation** Per Ivin Booty a pharmacist at [facility] the pts Pradaxa Non-formulary exception has been approved. Approval is good until 05/21/2019.  Reference#: [id number]

## 2018-10-09 DIAGNOSIS — C44319 Basal cell carcinoma of skin of other parts of face: Secondary | ICD-10-CM | POA: Diagnosis not present

## 2018-10-10 MED ORDER — DABIGATRAN ETEXILATE MESYLATE 150 MG PO CAPS: 150.0000 mg | ORAL_CAPSULE | Freq: Two times a day (BID) | ORAL | 3 refills | Status: DC

## 2018-10-10 NOTE — Telephone Encounter (Signed)
Call placed to Pt and wife.  Advised Pradaxa had been approved by their insurance.  Advised to start Pradaxa 150 mg one tablet by mouth twice a day after waiting 24 hours after last dose of Xarelto.  Pt and wife indicate understanding.  Thanked nurse for follow up

## 2018-10-16 DIAGNOSIS — H348312 Tributary (branch) retinal vein occlusion, right eye, stable: Secondary | ICD-10-CM | POA: Diagnosis not present

## 2018-11-26 DIAGNOSIS — H348312 Tributary (branch) retinal vein occlusion, right eye, stable: Secondary | ICD-10-CM | POA: Diagnosis not present

## 2018-11-26 DIAGNOSIS — H2513 Age-related nuclear cataract, bilateral: Secondary | ICD-10-CM | POA: Diagnosis not present

## 2018-12-10 DIAGNOSIS — Z1331 Encounter for screening for depression: Secondary | ICD-10-CM | POA: Diagnosis not present

## 2018-12-10 DIAGNOSIS — Z139 Encounter for screening, unspecified: Secondary | ICD-10-CM | POA: Diagnosis not present

## 2018-12-10 DIAGNOSIS — Z0000 Encounter for general adult medical examination without abnormal findings: Secondary | ICD-10-CM | POA: Diagnosis not present

## 2018-12-10 DIAGNOSIS — E785 Hyperlipidemia, unspecified: Secondary | ICD-10-CM | POA: Diagnosis not present

## 2018-12-10 DIAGNOSIS — Z9181 History of falling: Secondary | ICD-10-CM | POA: Diagnosis not present

## 2018-12-10 DIAGNOSIS — Z125 Encounter for screening for malignant neoplasm of prostate: Secondary | ICD-10-CM | POA: Diagnosis not present

## 2018-12-17 ENCOUNTER — Telehealth: Payer: Self-pay | Admitting: Internal Medicine

## 2018-12-17 DIAGNOSIS — L501 Idiopathic urticaria: Secondary | ICD-10-CM | POA: Diagnosis not present

## 2018-12-17 DIAGNOSIS — L570 Actinic keratosis: Secondary | ICD-10-CM | POA: Diagnosis not present

## 2018-12-17 DIAGNOSIS — L299 Pruritus, unspecified: Secondary | ICD-10-CM | POA: Diagnosis not present

## 2018-12-17 NOTE — Telephone Encounter (Signed)
New Message      Pt c/o medication issue:  1. Name of Medication: Flecainide   2. How are you currently taking this medication (dosage and times per day)? 50 mg   3. Are you having a reaction (difficulty breathing--STAT)? No  4. What is your medication issue? Pt saw the Dermatologist today and they told him they think it is the Flecainide that is making the pt itch, but the pt and his wife thinks it is the blood thinner, Eliquis because that's when it started. But now he is on Pradaxa and is still itching     They would like for Sonia Baller to call back

## 2018-12-17 NOTE — Telephone Encounter (Signed)
It's possible for flecainide to cause itching but this is not a common side effect - usually occurs at < 1% frequency.

## 2018-12-19 DIAGNOSIS — R972 Elevated prostate specific antigen [PSA]: Secondary | ICD-10-CM | POA: Diagnosis not present

## 2018-12-19 DIAGNOSIS — R351 Nocturia: Secondary | ICD-10-CM | POA: Diagnosis not present

## 2018-12-19 DIAGNOSIS — N401 Enlarged prostate with lower urinary tract symptoms: Secondary | ICD-10-CM | POA: Diagnosis not present

## 2018-12-19 DIAGNOSIS — R3121 Asymptomatic microscopic hematuria: Secondary | ICD-10-CM | POA: Diagnosis not present

## 2018-12-19 NOTE — Telephone Encounter (Signed)
Follow up   Patient calling back in due to patient having the itching. Patient is looking for a call back.

## 2018-12-19 NOTE — Telephone Encounter (Signed)
Spoke with Pt's wife (who was very tearful sounding on the phone).  Per wife Pt had urology appt today and found blood in urine.  Asked wife what urologist had recommended?  Per Urologist Pt will have recheck in 2 weeks.  Advised that if that was their recommendation Pt/wife should follow it.    Wife wants to know if Pt can take 1/2 amount of blood thinner.  Advised no.    Advised wife to have Pt continue his medications until his f/u appt with Dr. Rayann Heman on 8/12.  Advised I would call Pt/wife if any advisement received from Dr. Rayann Heman.

## 2018-12-21 NOTE — Telephone Encounter (Signed)
Stop flecainide. We will see if itching stops over the next 10 days off this medicine.

## 2018-12-22 ENCOUNTER — Telehealth: Payer: Self-pay

## 2018-12-22 NOTE — Telephone Encounter (Signed)
Call placed to Pt's wife.  Advised Dr. Rayann Heman states to stop flecainide to see if any improvement at his f/u.  Pt's wife indicates understanding.

## 2018-12-22 NOTE — Telephone Encounter (Signed)
Error

## 2018-12-29 DIAGNOSIS — Z6821 Body mass index (BMI) 21.0-21.9, adult: Secondary | ICD-10-CM | POA: Diagnosis not present

## 2018-12-29 DIAGNOSIS — M25522 Pain in left elbow: Secondary | ICD-10-CM | POA: Diagnosis not present

## 2018-12-31 ENCOUNTER — Ambulatory Visit: Payer: PPO | Admitting: Internal Medicine

## 2018-12-31 ENCOUNTER — Encounter: Payer: Self-pay | Admitting: Internal Medicine

## 2018-12-31 ENCOUNTER — Other Ambulatory Visit: Payer: Self-pay

## 2018-12-31 ENCOUNTER — Encounter (INDEPENDENT_AMBULATORY_CARE_PROVIDER_SITE_OTHER): Payer: Self-pay

## 2018-12-31 VITALS — BP 140/82 | HR 55 | Ht 70.0 in | Wt 153.0 lb

## 2018-12-31 DIAGNOSIS — I480 Paroxysmal atrial fibrillation: Secondary | ICD-10-CM

## 2018-12-31 DIAGNOSIS — I10 Essential (primary) hypertension: Secondary | ICD-10-CM | POA: Diagnosis not present

## 2018-12-31 DIAGNOSIS — R21 Rash and other nonspecific skin eruption: Secondary | ICD-10-CM | POA: Diagnosis not present

## 2018-12-31 NOTE — Patient Instructions (Addendum)
Medication Instructions:  Your physician has recommended you make the following change in your medication:  1. STOP Flecainide  2. STOP Pradaxa   * If you need a refill on your cardiac medications before your next appointment, please call your pharmacy.   Labwork: None ordered  Testing/Procedures: None ordered  Follow-Up: Your physician recommends that you schedule a follow-up appointment in: 2 months in the AFib clinic.  Thank you for choosing [facility]!!

## 2018-12-31 NOTE — Progress Notes (Signed)
PCP: Lowella Dandy, NP Primary Cardiologist: Angelena Form Primary EP: Dr Melford Aase is a 73 y.o. male who presents today for routine electrophysiology followup.  Since last being seen in our clinic, the patient reports doing reasonably well.  He has had no afib since November.  His primary concern is with a rash of unclear etiology.  He has taken steroids multiple times and has had a biopsy.  He is felt to have a drug exanthem.  He wonders if it is due to his CV medicines.  Today, he denies symptoms of palpitations, chest pain, shortness of breath,  lower extremity edema, dizziness, presyncope, or syncope.  The patient is otherwise without complaint today.   Past Medical History:  Diagnosis Date  . Allergy   . Arthritis    psoriatic arthritis   . Asthma   . Atheroembolism   . CAD (coronary artery disease)    hx non obst  . Cancer ([place])    basal cell x3 , last one removed - 12/12/2013, R temple area of forehead  . Chronic kidney disease    renal stones - tx. /w lithotripsy  . Complication of anesthesia   . HLD (hyperlipidemia)   . HTN (hypertension)   . Hypoglycemia   . Paroxysmal atrial fibrillation (HCC)   . PONV (postoperative nausea and vomiting)   . Psoriasis   . Shortness of breath    after eating , if he gets active, he might have some SOB   Past Surgical History:  Procedure Laterality Date  . AV FISTULA PLACEMENT  07-2009   Left Brachiocephalic AVF  . LIGATION OF ARTERIOVENOUS  FISTULA Left 12/30/2013   Procedure: EXCISION OF LEFT ARM ARTERIOVENOUS  FISTULA;  Surgeon: Rosetta Posner, MD;  Location: [facility];  Service: Vascular;  Laterality: Left;  . LITHOTRIPSY    . skin cancer removed     multiple basal cell    ROS- all systems are reviewed and negatives except as per HPI above  Current Outpatient Medications  Medication Sig Dispense Refill  . Acetaminophen (APAP) 325 MG tablet Take 650 mg by mouth every 4 (four) hours as needed for pain or fever. Take 2    .  amLODipine (NORVASC) 10 MG tablet Take 10 mg by mouth daily with breakfast.    . betamethasone dipropionate (DIPROLENE) 0.05 % cream Apply 1 application topically 2 (two) times daily as needed (for psoriasis).    . dabigatran (PRADAXA) 150 MG CAPS capsule Take 1 capsule (150 mg total) by mouth 2 (two) times daily. 180 capsule 3  . flecainide (TAMBOCOR) 50 MG tablet Take 1 tablet (50 mg total) by mouth 2 (two) times daily. 180 tablet 3  . labetalol (NORMODYNE) 200 MG tablet TAKE 1 TABLET TWO TIMES A DAY    . montelukast (SINGULAIR) 10 MG tablet Take 10 mg by mouth at bedtime.    Marland Kitchen OVER THE COUNTER MEDICATION Calcium with Vitamin D 3 plus minerals. One tablet everyday.    Marland Kitchen OVER THE COUNTER MEDICATION Co Q 10, 200 mg daily.    . pravastatin (PRAVACHOL) 80 MG tablet Take 80 mg by mouth daily.    . Triamcinolone Acetonide (KENALOG IJ) Inject 1 application as directed every 6 (six) months.     Current Facility-Administered Medications  Medication Dose Route Frequency Provider Last Rate Last Dose  . 0.9 %  sodium chloride infusion  500 mL Intravenous Once Jackquline Denmark, MD        Physical  Exam: Vitals:   12/31/18 1106  BP: 140/82  Pulse: (!) 55  SpO2: 99%  Weight: 153 lb (69.4 kg)  Height: 5\' 10"  (1.778 m)    GEN- The patient is well appearing, alert and oriented x 3 today.   Head- normocephalic, atraumatic Eyes-  Sclera clear, conjunctiva pink Ears- hearing intact Oropharynx- clear Lungs- Clear to ausculation bilaterally, normal work of breathing Heart- Regular rate and rhythm, no murmurs, rubs or gallops, PMI not laterally displaced GI- soft, NT, ND, + BS Extremities- no clubbing, cyanosis, or edema Skin- ecchymosis on forearms,  Macular rash on trunk  Wt Readings from Last 3 Encounters:  12/31/18 153 lb (69.4 kg)  05/26/18 159 lb (72.1 kg)  04/14/18 159 lb (72.1 kg)    EKG tracing ordered today is personally reviewed and shows sinus  Assessment and Plan:  1. Paroxysmal  atrial fibrillation Well controlled He wishes to stop flecainide and pradaxa to see if these are the cause of his rash.  We discussed at length.  I think a 6 week trial off of these medicines is probably ok.  If he does not have resolution, then we should resume medical therapy on follow-up in AF clinic in 2 months  2. HTN Stable No change required today  Follow-up in the AF clinic in 2 months   Thompson Grayer MD, [facility] 12/31/2018 11:22 AM

## 2019-01-05 DIAGNOSIS — I4891 Unspecified atrial fibrillation: Secondary | ICD-10-CM | POA: Diagnosis not present

## 2019-01-05 DIAGNOSIS — I2510 Atherosclerotic heart disease of native coronary artery without angina pectoris: Secondary | ICD-10-CM | POA: Diagnosis not present

## 2019-01-05 DIAGNOSIS — I10 Essential (primary) hypertension: Secondary | ICD-10-CM | POA: Diagnosis not present

## 2019-01-05 DIAGNOSIS — N184 Chronic kidney disease, stage 4 (severe): Secondary | ICD-10-CM | POA: Diagnosis not present

## 2019-01-05 DIAGNOSIS — J309 Allergic rhinitis, unspecified: Secondary | ICD-10-CM | POA: Diagnosis not present

## 2019-01-05 DIAGNOSIS — E785 Hyperlipidemia, unspecified: Secondary | ICD-10-CM | POA: Diagnosis not present

## 2019-01-05 DIAGNOSIS — R7309 Other abnormal glucose: Secondary | ICD-10-CM | POA: Diagnosis not present

## 2019-01-05 DIAGNOSIS — Z6822 Body mass index (BMI) 22.0-22.9, adult: Secondary | ICD-10-CM | POA: Diagnosis not present

## 2019-01-07 DIAGNOSIS — N184 Chronic kidney disease, stage 4 (severe): Secondary | ICD-10-CM | POA: Diagnosis not present

## 2019-01-08 DIAGNOSIS — C4490 Unspecified malignant neoplasm of skin, unspecified: Secondary | ICD-10-CM | POA: Diagnosis not present

## 2019-01-08 DIAGNOSIS — I129 Hypertensive chronic kidney disease with stage 1 through stage 4 chronic kidney disease, or unspecified chronic kidney disease: Secondary | ICD-10-CM | POA: Diagnosis not present

## 2019-01-08 DIAGNOSIS — N184 Chronic kidney disease, stage 4 (severe): Secondary | ICD-10-CM | POA: Diagnosis not present

## 2019-01-08 DIAGNOSIS — I4891 Unspecified atrial fibrillation: Secondary | ICD-10-CM | POA: Diagnosis not present

## 2019-01-08 DIAGNOSIS — L298 Other pruritus: Secondary | ICD-10-CM | POA: Diagnosis not present

## 2019-01-08 DIAGNOSIS — I2510 Atherosclerotic heart disease of native coronary artery without angina pectoris: Secondary | ICD-10-CM | POA: Diagnosis not present

## 2019-01-15 ENCOUNTER — Other Ambulatory Visit: Payer: Self-pay | Admitting: Physician Assistant

## 2019-01-15 DIAGNOSIS — N184 Chronic kidney disease, stage 4 (severe): Secondary | ICD-10-CM

## 2019-01-16 ENCOUNTER — Ambulatory Visit
Admission: RE | Admit: 2019-01-16 | Discharge: 2019-01-16 | Disposition: A | Discharge status: Home / Self Care | Payer: PPO | Source: Ambulatory Visit | Attending: Physician Assistant | Admitting: Physician Assistant

## 2019-01-16 DIAGNOSIS — N189 Chronic kidney disease, unspecified: Secondary | ICD-10-CM | POA: Diagnosis not present

## 2019-01-16 DIAGNOSIS — N184 Chronic kidney disease, stage 4 (severe): Secondary | ICD-10-CM

## 2019-01-21 DIAGNOSIS — I129 Hypertensive chronic kidney disease with stage 1 through stage 4 chronic kidney disease, or unspecified chronic kidney disease: Secondary | ICD-10-CM | POA: Diagnosis not present

## 2019-01-21 DIAGNOSIS — I2510 Atherosclerotic heart disease of native coronary artery without angina pectoris: Secondary | ICD-10-CM | POA: Diagnosis not present

## 2019-01-21 DIAGNOSIS — N184 Chronic kidney disease, stage 4 (severe): Secondary | ICD-10-CM | POA: Diagnosis not present

## 2019-01-21 DIAGNOSIS — I4891 Unspecified atrial fibrillation: Secondary | ICD-10-CM | POA: Diagnosis not present

## 2019-01-21 DIAGNOSIS — N189 Chronic kidney disease, unspecified: Secondary | ICD-10-CM | POA: Diagnosis not present

## 2019-01-21 DIAGNOSIS — D631 Anemia in chronic kidney disease: Secondary | ICD-10-CM | POA: Diagnosis not present

## 2019-01-21 DIAGNOSIS — L298 Other pruritus: Secondary | ICD-10-CM | POA: Diagnosis not present

## 2019-01-21 DIAGNOSIS — N2581 Secondary hyperparathyroidism of renal origin: Secondary | ICD-10-CM | POA: Diagnosis not present

## 2019-01-21 DIAGNOSIS — C4490 Unspecified malignant neoplasm of skin, unspecified: Secondary | ICD-10-CM | POA: Diagnosis not present

## 2019-02-05 DIAGNOSIS — L820 Inflamed seborrheic keratosis: Secondary | ICD-10-CM | POA: Diagnosis not present

## 2019-02-13 DIAGNOSIS — Z6822 Body mass index (BMI) 22.0-22.9, adult: Secondary | ICD-10-CM | POA: Diagnosis not present

## 2019-02-13 DIAGNOSIS — R21 Rash and other nonspecific skin eruption: Secondary | ICD-10-CM | POA: Diagnosis not present

## 2019-02-23 DIAGNOSIS — N189 Chronic kidney disease, unspecified: Secondary | ICD-10-CM | POA: Diagnosis not present

## 2019-02-23 DIAGNOSIS — I4891 Unspecified atrial fibrillation: Secondary | ICD-10-CM | POA: Diagnosis not present

## 2019-02-23 DIAGNOSIS — L298 Other pruritus: Secondary | ICD-10-CM | POA: Diagnosis not present

## 2019-02-23 DIAGNOSIS — I129 Hypertensive chronic kidney disease with stage 1 through stage 4 chronic kidney disease, or unspecified chronic kidney disease: Secondary | ICD-10-CM | POA: Diagnosis not present

## 2019-02-23 DIAGNOSIS — N2581 Secondary hyperparathyroidism of renal origin: Secondary | ICD-10-CM | POA: Diagnosis not present

## 2019-02-23 DIAGNOSIS — I2510 Atherosclerotic heart disease of native coronary artery without angina pectoris: Secondary | ICD-10-CM | POA: Diagnosis not present

## 2019-02-23 DIAGNOSIS — D631 Anemia in chronic kidney disease: Secondary | ICD-10-CM | POA: Diagnosis not present

## 2019-02-23 DIAGNOSIS — N184 Chronic kidney disease, stage 4 (severe): Secondary | ICD-10-CM | POA: Diagnosis not present

## 2019-02-23 DIAGNOSIS — C4490 Unspecified malignant neoplasm of skin, unspecified: Secondary | ICD-10-CM | POA: Diagnosis not present

## 2019-03-03 ENCOUNTER — Encounter (HOSPITAL_COMMUNITY): Payer: Self-pay | Admitting: Nurse Practitioner

## 2019-03-03 ENCOUNTER — Ambulatory Visit (HOSPITAL_COMMUNITY)
Admission: RE | Admit: 2019-03-03 | Discharge: 2019-03-03 | Disposition: A | Discharge status: Home / Self Care | Payer: PPO | Source: Ambulatory Visit | Attending: Nurse Practitioner | Admitting: Nurse Practitioner

## 2019-03-03 ENCOUNTER — Other Ambulatory Visit: Payer: Self-pay

## 2019-03-03 VITALS — BP 144/84 | HR 71 | Ht 70.0 in | Wt 155.8 lb

## 2019-03-03 DIAGNOSIS — Z79899 Other long term (current) drug therapy: Secondary | ICD-10-CM | POA: Insufficient documentation

## 2019-03-03 DIAGNOSIS — I2510 Atherosclerotic heart disease of native coronary artery without angina pectoris: Secondary | ICD-10-CM | POA: Diagnosis not present

## 2019-03-03 DIAGNOSIS — Z8249 Family history of ischemic heart disease and other diseases of the circulatory system: Secondary | ICD-10-CM | POA: Insufficient documentation

## 2019-03-03 DIAGNOSIS — Z87891 Personal history of nicotine dependence: Secondary | ICD-10-CM | POA: Diagnosis not present

## 2019-03-03 DIAGNOSIS — Z882 Allergy status to sulfonamides status: Secondary | ICD-10-CM | POA: Diagnosis not present

## 2019-03-03 DIAGNOSIS — N189 Chronic kidney disease, unspecified: Secondary | ICD-10-CM | POA: Insufficient documentation

## 2019-03-03 DIAGNOSIS — E785 Hyperlipidemia, unspecified: Secondary | ICD-10-CM | POA: Diagnosis not present

## 2019-03-03 DIAGNOSIS — Z85828 Personal history of other malignant neoplasm of skin: Secondary | ICD-10-CM | POA: Insufficient documentation

## 2019-03-03 DIAGNOSIS — Z885 Allergy status to narcotic agent status: Secondary | ICD-10-CM | POA: Diagnosis not present

## 2019-03-03 DIAGNOSIS — I129 Hypertensive chronic kidney disease with stage 1 through stage 4 chronic kidney disease, or unspecified chronic kidney disease: Secondary | ICD-10-CM | POA: Diagnosis not present

## 2019-03-03 DIAGNOSIS — I480 Paroxysmal atrial fibrillation: Secondary | ICD-10-CM | POA: Diagnosis not present

## 2019-03-03 NOTE — Progress Notes (Signed)
Primary Care Physician: Lowella Dandy, NP Referring Physician: Dr. Melford Aase is a 73 y.o. male with a h/o remote afib that saw Dr. Rayann Heman a few months ago with rash. He had previously had rash with eliquis and xarelto. Flecainide and pradaxa were stopped and his rash almost immediatly went away. Pt believes it was the anticoagulation that caused the rash.   He also reports that he has not had any afib off the drugs. He feels well.   Today, he denies symptoms of palpitations, chest pain, shortness of breath, orthopnea, PND, lower extremity edema, dizziness, presyncope, syncope, or neurologic sequela. The patient is tolerating medications without difficulties and is otherwise without complaint today.   Past Medical History:  Diagnosis Date  . Allergy   . Arthritis    psoriatic arthritis   . Asthma   . Atheroembolism   . CAD (coronary artery disease)    hx non obst  . Cancer ([place])    basal cell x3 , last one removed - 12/12/2013, R temple area of forehead  . Chronic kidney disease    renal stones - tx. /w lithotripsy  . Complication of anesthesia   . HLD (hyperlipidemia)   . HTN (hypertension)   . Hypoglycemia   . Paroxysmal atrial fibrillation (HCC)   . PONV (postoperative nausea and vomiting)   . Psoriasis   . Shortness of breath    after eating , if he gets active, he might have some SOB   Past Surgical History:  Procedure Laterality Date  . AV FISTULA PLACEMENT  07-2009   Left Brachiocephalic AVF  . LIGATION OF ARTERIOVENOUS  FISTULA Left 12/30/2013   Procedure: EXCISION OF LEFT ARM ARTERIOVENOUS  FISTULA;  Surgeon: Rosetta Posner, MD;  Location: [facility];  Service: Vascular;  Laterality: Left;  . LITHOTRIPSY    . skin cancer removed     multiple basal cell    Current Outpatient Medications  Medication Sig Dispense Refill  . Acetaminophen (APAP) 325 MG tablet Take 650 mg by mouth every 4 (four) hours as needed for pain or fever. Take 2    . amLODipine (NORVASC)  10 MG tablet Take 10 mg by mouth daily with breakfast.    . betamethasone dipropionate (DIPROLENE) 0.05 % cream Apply 1 application topically 2 (two) times daily as needed (for psoriasis).    . labetalol (NORMODYNE) 200 MG tablet TAKE 1 TABLET TWO TIMES A DAY    . loratadine (CLARITIN) 10 MG tablet Take 10 mg by mouth daily.    . montelukast (SINGULAIR) 10 MG tablet Take 10 mg by mouth at bedtime.    Marland Kitchen OVER THE COUNTER MEDICATION Calcium with Vitamin D 3 plus minerals. One tablet everyday.    Marland Kitchen OVER THE COUNTER MEDICATION Co Q 10, 200 mg daily.    . pravastatin (PRAVACHOL) 40 MG tablet Take 40 mg by mouth daily.     . Triamcinolone Acetonide (KENALOG IJ) Inject 1 application as directed every 6 (six) months.     Current Facility-Administered Medications  Medication Dose Route Frequency Provider Last Rate Last Dose  . 0.9 %  sodium chloride infusion  500 mL Intravenous Once Jackquline Denmark, MD        Allergies  Allergen Reactions  . Hydrocodone Nausea Only  . Oxycodone Nausea And Vomiting  . Hydrochlorothiazide     anxious  . Hytrin [Terazosin Hcl]     Heart races  . Sulfa Antibiotics Itching  Social History   Socioeconomic History  . Marital status: Married    Spouse name: Not on file  . Number of children: 0  . Years of education: Not on file  . Highest education level: Not on file  Occupational History  . Occupation: Retired from a Engineer, materials  Social Needs  . Financial resource strain: Not on file  . Food insecurity    Worry: Not on file    Inability: Not on file  . Transportation needs    Medical: Not on file    Non-medical: Not on file  Tobacco Use  . Smoking status: Former Smoker    Quit date: 05/21/1970    Years since quitting: 48.8  . Smokeless tobacco: Never Used  . Tobacco comment: quit 40+ years ago   Substance and Sexual Activity  . Alcohol use: No  . Drug use: No  . Sexual activity: Not on file  Lifestyle  . Physical activity    Days per week: Not  on file    Minutes per session: Not on file  . Stress: Not on file  Relationships  . Social Herbalist on phone: Not on file    Gets together: Not on file    Attends religious service: Not on file    Active member of club or organization: Not on file    Attends meetings of clubs or organizations: Not on file    Relationship status: Not on file  . Intimate partner violence    Fear of current or ex partner: Not on file    Emotionally abused: Not on file    Physically abused: Not on file    Forced sexual activity: Not on file  Other Topics Concern  . Not on file  Social History Narrative   Married, lives with wife; Loss adjuster, chartered.   Lives in [place]    Family History  Problem Relation Age of Onset  . Cancer Mother   . Heart disease Father        Before age 28  . Asthma Father   . Heart disease Brother        After 75 years of age  . Coronary artery disease Unknown        family hx  . Colon cancer Neg Hx   . Esophageal cancer Neg Hx   . Liver cancer Neg Hx   . Pancreatic cancer Neg Hx   . Rectal cancer Neg Hx   . Stomach cancer Neg Hx     ROS- All systems are reviewed and negative except as per the HPI above  Physical Exam: Vitals:   03/03/19 1341  BP: (!) 144/84  Pulse: 71  Weight: 70.7 kg  Height: 5\' 10"  (1.778 m)   Wt Readings from Last 3 Encounters:  03/03/19 70.7 kg  12/31/18 69.4 kg  05/26/18 72.1 kg    Labs: Lab Results  Component Value Date   NA 142 12/24/2013   K 4.5 12/24/2013   CL 103 12/24/2013   CO2 26 12/24/2013   GLUCOSE 87 12/24/2013   BUN 27 (H) 12/24/2013   CREATININE 1.45 (H) 12/24/2013   CALCIUM 9.2 12/24/2013   Lab Results  Component Value Date   INR 0.99 05/17/2009   Lab Results  Component Value Date   CHOL (H) 05/18/2009    237        ATP III CLASSIFICATION:  <200     mg/dL   Desirable  200-239  mg/dL  Borderline High  >=240    mg/dL   High          HDL 38 (L) 05/18/2009   LDLCALC (H)  05/18/2009    173        Total Cholesterol/HDL:CHD Risk Coronary Heart Disease Risk Table                     Men   Women  1/2 Average Risk   3.4   3.3  Average Risk       5.0   4.4  2 X Average Risk   9.6   7.1  3 X Average Risk  23.4   11.0        Use the calculated Patient Ratio above and the CHD Risk Table to determine the patient's CHD Risk.        ATP III CLASSIFICATION (LDL):  <100     mg/dL   Optimal  100-129  mg/dL   Near or Above                    Optimal  130-159  mg/dL   Borderline  160-189  mg/dL   High  >190     mg/dL   Very High   TRIG 128 05/18/2009     GEN- The patient is well appearing, alert and oriented x 3 today.   Head- normocephalic, atraumatic Eyes-  Sclera clear, conjunctiva pink Ears- hearing intact Oropharynx- clear Neck- supple, no JVP Lymph- no cervical lymphadenopathy Lungs- Clear to ausculation bilaterally, normal work of breathing Heart- Regular rate and rhythm, no murmurs, rubs or gallops, PMI not laterally displaced GI- soft, NT, ND, + BS Extremities- no clubbing, cyanosis, or edema MS- no significant deformity or atrophy Skin- no rash or lesion Psych- euthymic mood, full affect Neuro- strength and sensation are intact  EKG-NSR at 71 bpm, PR int 146 ms, qrs int 84 ms, qtc 399 ms Epic records reviewed    Assessment and Plan: 1.Paroxysmal afib  Staying in SR without flecainide, no afib x one year Continue on labetalol 200 mg bid   2. CHA2DS2VASc Score of 2  For now appears to have allergy to eliquis, xarelto and pradaxa, rash resolved with stopping pradaxa in August  If afib resumes will need to be on coumadin   F/u as needed   Butch Penny C. Channah Godeaux, [facility] [address] [place], [place] [address] [phone]

## 2019-03-27 DIAGNOSIS — Z23 Encounter for immunization: Secondary | ICD-10-CM | POA: Diagnosis not present

## 2019-04-17 DIAGNOSIS — I4891 Unspecified atrial fibrillation: Secondary | ICD-10-CM | POA: Diagnosis not present

## 2019-04-17 DIAGNOSIS — Z6822 Body mass index (BMI) 22.0-22.9, adult: Secondary | ICD-10-CM | POA: Diagnosis not present

## 2019-04-17 DIAGNOSIS — H539 Unspecified visual disturbance: Secondary | ICD-10-CM | POA: Diagnosis not present

## 2019-04-17 DIAGNOSIS — R7309 Other abnormal glucose: Secondary | ICD-10-CM | POA: Diagnosis not present

## 2019-04-17 DIAGNOSIS — I10 Essential (primary) hypertension: Secondary | ICD-10-CM | POA: Diagnosis not present

## 2019-04-17 DIAGNOSIS — N184 Chronic kidney disease, stage 4 (severe): Secondary | ICD-10-CM | POA: Diagnosis not present

## 2019-04-17 DIAGNOSIS — L409 Psoriasis, unspecified: Secondary | ICD-10-CM | POA: Diagnosis not present

## 2019-04-17 DIAGNOSIS — E785 Hyperlipidemia, unspecified: Secondary | ICD-10-CM | POA: Diagnosis not present

## 2019-04-17 DIAGNOSIS — I2510 Atherosclerotic heart disease of native coronary artery without angina pectoris: Secondary | ICD-10-CM | POA: Diagnosis not present

## 2019-04-17 DIAGNOSIS — J309 Allergic rhinitis, unspecified: Secondary | ICD-10-CM | POA: Diagnosis not present

## 2019-05-12 DIAGNOSIS — L299 Pruritus, unspecified: Secondary | ICD-10-CM | POA: Diagnosis not present

## 2019-07-21 DIAGNOSIS — H539 Unspecified visual disturbance: Secondary | ICD-10-CM | POA: Diagnosis not present

## 2019-07-21 DIAGNOSIS — J309 Allergic rhinitis, unspecified: Secondary | ICD-10-CM | POA: Diagnosis not present

## 2019-07-21 DIAGNOSIS — I2510 Atherosclerotic heart disease of native coronary artery without angina pectoris: Secondary | ICD-10-CM | POA: Diagnosis not present

## 2019-07-21 DIAGNOSIS — Z6822 Body mass index (BMI) 22.0-22.9, adult: Secondary | ICD-10-CM | POA: Diagnosis not present

## 2019-07-21 DIAGNOSIS — R7309 Other abnormal glucose: Secondary | ICD-10-CM | POA: Diagnosis not present

## 2019-07-21 DIAGNOSIS — I4891 Unspecified atrial fibrillation: Secondary | ICD-10-CM | POA: Diagnosis not present

## 2019-07-21 DIAGNOSIS — E785 Hyperlipidemia, unspecified: Secondary | ICD-10-CM | POA: Diagnosis not present

## 2019-07-21 DIAGNOSIS — I10 Essential (primary) hypertension: Secondary | ICD-10-CM | POA: Diagnosis not present

## 2019-07-21 DIAGNOSIS — N184 Chronic kidney disease, stage 4 (severe): Secondary | ICD-10-CM | POA: Diagnosis not present

## 2019-08-23 IMAGING — NM NM RENAL IMAGING FLOW W/ PHARM
2 series · 12 of 12 positions shown · non-contrast
Comparison: CT abdomen and pelvis 01/22/2017

CLINICAL DATA: Chronic renal disease, hydronephrosis, UPJ
obstruction

EXAM:
NUCLEAR MEDICINE RENAL SCAN WITH DIURETIC ADMINISTRATION
TECHNIQUE: Radionuclide angiographic and sequential renal images were obtained
after intravenous injection of radiopharmaceutical. Imaging was
continued during slow intravenous injection of Lasix approximately
15 minutes after the start of the examination.
RADIOPHARMACEUTICALS:  5 mCi Dechnetium-LLm MAG3 IV
Pharmaceutical:  36 mg Lasix IV at mid point of imaging.

[Series 1: renal scan · 4.14mm/px · 6 of 88 frames shown (1 of 2)]
[frame 8/88]
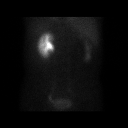
[frame 22/88]
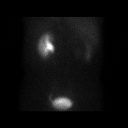
[frame 37/88]
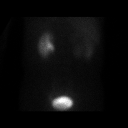
[frame 52/88]
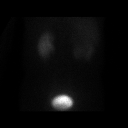
[frame 66/88]
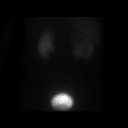
[frame 81/88]
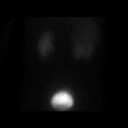

[Series 1: renal scan · 4.14mm/px · 6 of 40 frames shown (2 of 2)]
[frame 4/40  full-range]
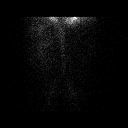
[frame 10/40  full-range]
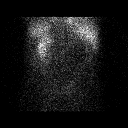
[frame 17/40  full-range]
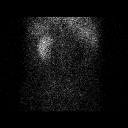
[frame 24/40  full-range]
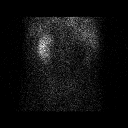
[frame 30/40  full-range]
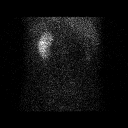
[frame 37/40  full-range]
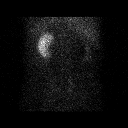

[12 of 12 positions shown; findings below may reference images not displayed]

FINDINGS: Flow: Prompt symmetric arterial flow to LEFT kidney. Only minimal
delayed flow to RIGHT kidney.

Left renogram: Normal uptake, concentration and excretion of tracer
by LEFT kidney. Normal time to peak activity of 6.7 minutes with
fall to half maximum activity 15.5 minutes later. No abnormal
retention of tracer at the conclusion of the exam.

Right renogram: Markedly delayed and diminished uptake and
concentration of tracer by RIGHT kidney. Only minimal excretion of
tracer into a dilated RIGHT renal collecting system is identified by
the conclusion of the exam. No significant washout either before or
following Lasix administration.

Differential at the 2-3 minute interval:

Left kidney = 100 %

Right kidney = 0 %

T1/2 post Lasix :

Left kidney = 10 min

Right kidney = N/A min
IMPRESSION: Normal LEFT renogram.

No appreciable RIGHT renal function.

Markedly dilated RIGHT renal collecting system containing only a
minimal amount of tracer at the conclusion of the exam.

## 2019-09-10 DIAGNOSIS — I2510 Atherosclerotic heart disease of native coronary artery without angina pectoris: Secondary | ICD-10-CM | POA: Diagnosis not present

## 2019-09-10 DIAGNOSIS — I4891 Unspecified atrial fibrillation: Secondary | ICD-10-CM | POA: Diagnosis not present

## 2019-09-10 DIAGNOSIS — N2581 Secondary hyperparathyroidism of renal origin: Secondary | ICD-10-CM | POA: Diagnosis not present

## 2019-09-10 DIAGNOSIS — C4490 Unspecified malignant neoplasm of skin, unspecified: Secondary | ICD-10-CM | POA: Diagnosis not present

## 2019-09-10 DIAGNOSIS — I129 Hypertensive chronic kidney disease with stage 1 through stage 4 chronic kidney disease, or unspecified chronic kidney disease: Secondary | ICD-10-CM | POA: Diagnosis not present

## 2019-09-10 DIAGNOSIS — N184 Chronic kidney disease, stage 4 (severe): Secondary | ICD-10-CM | POA: Diagnosis not present

## 2019-09-10 DIAGNOSIS — N189 Chronic kidney disease, unspecified: Secondary | ICD-10-CM | POA: Diagnosis not present

## 2019-09-10 DIAGNOSIS — L298 Other pruritus: Secondary | ICD-10-CM | POA: Diagnosis not present

## 2019-09-14 DIAGNOSIS — L299 Pruritus, unspecified: Secondary | ICD-10-CM | POA: Diagnosis not present

## 2019-09-14 DIAGNOSIS — D225 Melanocytic nevi of trunk: Secondary | ICD-10-CM | POA: Diagnosis not present

## 2019-09-14 DIAGNOSIS — L820 Inflamed seborrheic keratosis: Secondary | ICD-10-CM | POA: Diagnosis not present

## 2019-09-14 DIAGNOSIS — D171 Benign lipomatous neoplasm of skin and subcutaneous tissue of trunk: Secondary | ICD-10-CM | POA: Diagnosis not present

## 2019-10-02 DIAGNOSIS — D485 Neoplasm of uncertain behavior of skin: Secondary | ICD-10-CM | POA: Diagnosis not present

## 2019-10-20 DIAGNOSIS — H43392 Other vitreous opacities, left eye: Secondary | ICD-10-CM | POA: Diagnosis not present

## 2019-10-22 DIAGNOSIS — H539 Unspecified visual disturbance: Secondary | ICD-10-CM | POA: Diagnosis not present

## 2019-10-22 DIAGNOSIS — I10 Essential (primary) hypertension: Secondary | ICD-10-CM | POA: Diagnosis not present

## 2019-10-22 DIAGNOSIS — R7309 Other abnormal glucose: Secondary | ICD-10-CM | POA: Diagnosis not present

## 2019-10-22 DIAGNOSIS — E785 Hyperlipidemia, unspecified: Secondary | ICD-10-CM | POA: Diagnosis not present

## 2019-10-22 DIAGNOSIS — R972 Elevated prostate specific antigen [PSA]: Secondary | ICD-10-CM | POA: Diagnosis not present

## 2019-10-22 DIAGNOSIS — N184 Chronic kidney disease, stage 4 (severe): Secondary | ICD-10-CM | POA: Diagnosis not present

## 2019-10-22 DIAGNOSIS — I480 Paroxysmal atrial fibrillation: Secondary | ICD-10-CM | POA: Diagnosis not present

## 2019-10-22 DIAGNOSIS — Z6822 Body mass index (BMI) 22.0-22.9, adult: Secondary | ICD-10-CM | POA: Diagnosis not present

## 2019-10-22 DIAGNOSIS — I2510 Atherosclerotic heart disease of native coronary artery without angina pectoris: Secondary | ICD-10-CM | POA: Diagnosis not present

## 2019-10-22 DIAGNOSIS — J309 Allergic rhinitis, unspecified: Secondary | ICD-10-CM | POA: Diagnosis not present

## 2019-12-11 DIAGNOSIS — N401 Enlarged prostate with lower urinary tract symptoms: Secondary | ICD-10-CM | POA: Diagnosis not present

## 2019-12-11 DIAGNOSIS — R972 Elevated prostate specific antigen [PSA]: Secondary | ICD-10-CM | POA: Diagnosis not present

## 2019-12-11 DIAGNOSIS — R351 Nocturia: Secondary | ICD-10-CM | POA: Diagnosis not present

## 2019-12-17 DIAGNOSIS — Z9181 History of falling: Secondary | ICD-10-CM | POA: Diagnosis not present

## 2019-12-17 DIAGNOSIS — Z139 Encounter for screening, unspecified: Secondary | ICD-10-CM | POA: Diagnosis not present

## 2019-12-17 DIAGNOSIS — Z1331 Encounter for screening for depression: Secondary | ICD-10-CM | POA: Diagnosis not present

## 2019-12-17 DIAGNOSIS — Z0000 Encounter for general adult medical examination without abnormal findings: Secondary | ICD-10-CM | POA: Diagnosis not present

## 2019-12-17 DIAGNOSIS — E785 Hyperlipidemia, unspecified: Secondary | ICD-10-CM | POA: Diagnosis not present

## 2019-12-28 DIAGNOSIS — L299 Pruritus, unspecified: Secondary | ICD-10-CM | POA: Diagnosis not present

## 2020-01-26 DIAGNOSIS — I672 Cerebral atherosclerosis: Secondary | ICD-10-CM | POA: Diagnosis not present

## 2020-01-26 DIAGNOSIS — I639 Cerebral infarction, unspecified: Secondary | ICD-10-CM | POA: Diagnosis not present

## 2020-01-26 DIAGNOSIS — I4891 Unspecified atrial fibrillation: Secondary | ICD-10-CM | POA: Diagnosis not present

## 2020-01-26 DIAGNOSIS — I771 Stricture of artery: Secondary | ICD-10-CM | POA: Diagnosis not present

## 2020-01-26 DIAGNOSIS — L409 Psoriasis, unspecified: Secondary | ICD-10-CM | POA: Diagnosis not present

## 2020-01-26 DIAGNOSIS — G9389 Other specified disorders of brain: Secondary | ICD-10-CM | POA: Diagnosis not present

## 2020-01-26 DIAGNOSIS — J309 Allergic rhinitis, unspecified: Secondary | ICD-10-CM | POA: Diagnosis not present

## 2020-01-26 DIAGNOSIS — R972 Elevated prostate specific antigen [PSA]: Secondary | ICD-10-CM | POA: Diagnosis not present

## 2020-01-26 DIAGNOSIS — Z79899 Other long term (current) drug therapy: Secondary | ICD-10-CM | POA: Diagnosis not present

## 2020-01-26 DIAGNOSIS — I2510 Atherosclerotic heart disease of native coronary artery without angina pectoris: Secondary | ICD-10-CM | POA: Diagnosis not present

## 2020-01-26 DIAGNOSIS — H539 Unspecified visual disturbance: Secondary | ICD-10-CM | POA: Diagnosis not present

## 2020-01-26 DIAGNOSIS — N183 Chronic kidney disease, stage 3 unspecified: Secondary | ICD-10-CM | POA: Diagnosis not present

## 2020-01-26 DIAGNOSIS — N184 Chronic kidney disease, stage 4 (severe): Secondary | ICD-10-CM | POA: Diagnosis not present

## 2020-01-26 DIAGNOSIS — I480 Paroxysmal atrial fibrillation: Secondary | ICD-10-CM | POA: Diagnosis not present

## 2020-01-26 DIAGNOSIS — R4781 Slurred speech: Secondary | ICD-10-CM | POA: Diagnosis not present

## 2020-01-26 DIAGNOSIS — Z7982 Long term (current) use of aspirin: Secondary | ICD-10-CM | POA: Diagnosis not present

## 2020-01-26 DIAGNOSIS — R29700 NIHSS score 0: Secondary | ICD-10-CM | POA: Diagnosis not present

## 2020-01-26 DIAGNOSIS — I129 Hypertensive chronic kidney disease with stage 1 through stage 4 chronic kidney disease, or unspecified chronic kidney disease: Secondary | ICD-10-CM | POA: Diagnosis not present

## 2020-01-26 DIAGNOSIS — G459 Transient cerebral ischemic attack, unspecified: Secondary | ICD-10-CM | POA: Diagnosis not present

## 2020-01-26 DIAGNOSIS — R7309 Other abnormal glucose: Secondary | ICD-10-CM | POA: Diagnosis not present

## 2020-01-26 DIAGNOSIS — R200 Anesthesia of skin: Secondary | ICD-10-CM | POA: Diagnosis not present

## 2020-01-26 DIAGNOSIS — E785 Hyperlipidemia, unspecified: Secondary | ICD-10-CM | POA: Diagnosis not present

## 2020-01-26 DIAGNOSIS — Z6821 Body mass index (BMI) 21.0-21.9, adult: Secondary | ICD-10-CM | POA: Diagnosis not present

## 2020-01-26 DIAGNOSIS — I10 Essential (primary) hypertension: Secondary | ICD-10-CM | POA: Diagnosis not present

## 2020-01-26 DIAGNOSIS — I6523 Occlusion and stenosis of bilateral carotid arteries: Secondary | ICD-10-CM | POA: Diagnosis not present

## 2020-01-27 DIAGNOSIS — G9389 Other specified disorders of brain: Secondary | ICD-10-CM | POA: Diagnosis not present

## 2020-01-27 DIAGNOSIS — R200 Anesthesia of skin: Secondary | ICD-10-CM | POA: Diagnosis not present

## 2020-01-27 DIAGNOSIS — G459 Transient cerebral ischemic attack, unspecified: Secondary | ICD-10-CM | POA: Diagnosis not present

## 2020-01-27 DIAGNOSIS — I6523 Occlusion and stenosis of bilateral carotid arteries: Secondary | ICD-10-CM | POA: Diagnosis not present

## 2020-01-27 DIAGNOSIS — I4891 Unspecified atrial fibrillation: Secondary | ICD-10-CM | POA: Diagnosis not present

## 2020-01-27 DIAGNOSIS — R4781 Slurred speech: Secondary | ICD-10-CM | POA: Diagnosis not present

## 2020-01-27 DIAGNOSIS — I771 Stricture of artery: Secondary | ICD-10-CM | POA: Diagnosis not present

## 2020-02-05 ENCOUNTER — Ambulatory Visit: Payer: PPO | Admitting: Cardiovascular Disease

## 2020-02-05 ENCOUNTER — Encounter: Payer: Self-pay | Admitting: Cardiovascular Disease

## 2020-02-05 ENCOUNTER — Other Ambulatory Visit: Payer: Self-pay

## 2020-02-05 VITALS — BP 136/78 | HR 55 | Ht 70.0 in | Wt 152.8 lb

## 2020-02-05 DIAGNOSIS — I6523 Occlusion and stenosis of bilateral carotid arteries: Secondary | ICD-10-CM | POA: Diagnosis not present

## 2020-02-05 DIAGNOSIS — I480 Paroxysmal atrial fibrillation: Secondary | ICD-10-CM

## 2020-02-05 DIAGNOSIS — I2510 Atherosclerotic heart disease of native coronary artery without angina pectoris: Secondary | ICD-10-CM

## 2020-02-05 DIAGNOSIS — I10 Essential (primary) hypertension: Secondary | ICD-10-CM

## 2020-02-05 NOTE — Patient Instructions (Signed)
Medication Instructions:  No changes  *If you need a refill on your cardiac medications before your next appointment, please call your pharmacy*   Lab Work: None If you have labs (blood work) drawn today and your tests are completely normal, you will receive your results only by:  [place] (if you have MyChart) OR  A paper copy in the mail If you have any lab test that is abnormal or we need to change your treatment, we will call you to review the results.   Testing/Procedures: none   Follow-Up: At [facility], you and your health needs are our priority.  As part of our continuing mission to provide you with exceptional heart care, we have created designated Provider Care Teams.  These Care Teams include your primary Cardiologist (physician) and Advanced Practice Providers (APPs -  Physician Assistants and Nurse Practitioners) who all work together to provide you with the care you need, when you need it.  We recommend signing up for the patient portal called "MyChart".  Sign up information is provided on this After Visit Summary.  MyChart is used to connect with patients for Virtual Visits (Telemedicine).  Patients are able to view lab/test results, encounter notes, upcoming appointments, etc.  Non-urgent messages can be sent to your provider as well.   To learn more about what you can do with MyChart, go to [email or web address].    Your next appointment:   6 month(s)  The format for your next appointment:   In Person  Provider:   You may see Lauree Chandler, MD or one of the following Advanced Practice Providers on your designated Care Team:    Melina Copa, PA-C  Ermalinda Barrios, PA-C    Other Instructions Dr. Angelena Form will review the records from [facility].

## 2020-02-05 NOTE — Progress Notes (Addendum)
Chief Complaint  Patient presents with  . Follow-up    CAD   History of Present Illness: 74 yo male with history of carotid artery disease, Chronic kidney disease, CAD, paroxysmal atrial fibrillation, HTN and hyperlipidemia here today for follow up. I saw him as a new patient 02/20/18 for evaluation of atrial fibrillation. He has been followed in the past by Dr. Johnsie Cancel. He had a cardiac cath in 2010 with Dr. Olevia Perches and was found to have mild CAD. He was suspected to have atheroemboli during the cath and had worsened renal insufficiency post cath. He had not been on coumadin. Echo in 2012 showed normal LV systolic function. There was grade 1 diastolic dysfunction. He had an AV fistula placed back in 2011 and this was removed in 2015 as he never progressed to ESRD. He woke up on 02/10/18 and felt weak and his heart was racing. He went in to see Laverna Peace, NP and was found to have atrial fibrillation with HR 100 bpm. He was started on Eliquis. Carotid artery dopplers 11/11/17 with 50-69% narrowing bilateral ICA. I arranged an echo on 02/28/18 which showed normal LV systolic function, QION=62-95%. Cardiac monitor with atrial fib. He has been followed by Dr. Rayann Heman. He could not tolerate Eliquis, Xarelto or Pradaxa due to a rash. He is now on Pradaxa flecainide and ASA. He was admitted to [facility] on 01/26/20 with slurred speech, swallowing trouble. He thought he was having a stroke. Head CT with no bleeding. Brain MRI with no evidence of stroke per pt. He says he had an echo that was reported as normal to the patient. He also had carotid dopplers. I cannot see any of these records.   (Addendum: 02/15/20): Records from [facility]: EKG 01/26/20 with NSR.  CT head 01/26/20; no acute bleeding or infarct.  Echo 01/26/20; LVEF=55-60%. Trace AI, MR, TR.  Carotid artery dopplers 01/26/20: RICA 28-41% stenosis, LICA 32% stenosis Creatinine 2.0  Troponin negative x 2.   He is here today for follow up. The  patient denies any chest pain, dyspnea, palpitations, lower extremity edema, orthopnea, PND, dizziness, near syncope or syncope.    Primary Care Physician: Lowella Dandy, NP  Past Medical History:  Diagnosis Date  . Allergy   . Arthritis    psoriatic arthritis   . Asthma   . Atheroembolism   . CAD (coronary artery disease)    hx non obst  . Cancer ([place])    basal cell x3 , last one removed - 12/12/2013, R temple area of forehead  . Chronic kidney disease    renal stones - tx. /w lithotripsy  . Complication of anesthesia   . HLD (hyperlipidemia)   . HTN (hypertension)   . Hypoglycemia   . Paroxysmal atrial fibrillation (HCC)   . PONV (postoperative nausea and vomiting)   . Psoriasis   . Shortness of breath    after eating , if he gets active, he might have some SOB    Past Surgical History:  Procedure Laterality Date  . AV FISTULA PLACEMENT  07-2009   Left Brachiocephalic AVF  . LIGATION OF ARTERIOVENOUS  FISTULA Left 12/30/2013   Procedure: EXCISION OF LEFT ARM ARTERIOVENOUS  FISTULA;  Surgeon: Rosetta Posner, MD;  Location: [place];  Service: Vascular;  Laterality: Left;  . LITHOTRIPSY    . skin cancer removed     multiple basal cell    Current Outpatient Medications  Medication Sig Dispense Refill  . Acetaminophen (APAP)  325 MG tablet Take 650 mg by mouth every 4 (four) hours as needed for pain or fever. Take 2    . amLODipine (NORVASC) 10 MG tablet Take 10 mg by mouth daily with breakfast.    . aspirin 325 MG EC tablet Take 325 mg by mouth daily.    . betamethasone dipropionate (DIPROLENE) 0.05 % cream Apply 1 application topically 2 (two) times daily as needed (for psoriasis).    . flecainide (TAMBOCOR) 50 MG tablet Take 50 mg by mouth in the morning and at bedtime.    Marland Kitchen labetalol (NORMODYNE) 200 MG tablet Take 300 mg by mouth once.     . loratadine (CLARITIN) 10 MG tablet Take 10 mg by mouth daily.    . montelukast (SINGULAIR) 10 MG tablet Take 10 mg by mouth at bedtime.     Marland Kitchen OVER THE COUNTER MEDICATION Calcium with Vitamin D 3 plus minerals. One tablet everyday.    Marland Kitchen OVER THE COUNTER MEDICATION Co Q 10, 200 mg daily.    . pravastatin (PRAVACHOL) 40 MG tablet Take 40 mg by mouth daily.     . Triamcinolone Acetonide (KENALOG IJ) Inject 1 application as directed every 6 (six) months.     Current Facility-Administered Medications  Medication Dose Route Frequency Provider Last Rate Last Admin  . 0.9 %  sodium chloride infusion  500 mL Intravenous Once Jackquline Denmark, MD        Allergies  Allergen Reactions  . Hydrocodone Nausea Only  . Oxycodone Nausea And Vomiting  . Hydrochlorothiazide     anxious  . Hytrin [Terazosin Hcl]     Heart races  . Sulfa Antibiotics Itching    Social History   Socioeconomic History  . Marital status: Married    Spouse name: Not on file  . Number of children: 0  . Years of education: Not on file  . Highest education level: Not on file  Occupational History  . Occupation: Retired from a Engineer, materials  Tobacco Use  . Smoking status: Former Smoker    Quit date: 05/21/1970    Years since quitting: 49.7  . Smokeless tobacco: Never Used  . Tobacco comment: quit 40+ years ago   Substance and Sexual Activity  . Alcohol use: No  . Drug use: No  . Sexual activity: Not on file  Other Topics Concern  . Not on file  Social History Narrative   Married, lives with wife; Loss adjuster, chartered.   Lives in [place]   Social Determinants of Health   Financial Resource Strain:   . Difficulty of Paying Living Expenses: Not on file  Food Insecurity:   . Worried About Charity fundraiser in the Last Year: Not on file  . Ran Out of Food in the Last Year: Not on file  Transportation Needs:   . Lack of Transportation (Medical): Not on file  . Lack of Transportation (Non-Medical): Not on file  Physical Activity:   . Days of Exercise per Week: Not on file  . Minutes of Exercise per Session: Not on file  Stress:   . Feeling of  Stress : Not on file  Social Connections:   . Frequency of Communication with Friends and Family: Not on file  . Frequency of Social Gatherings with Friends and Family: Not on file  . Attends Religious Services: Not on file  . Active Member of Clubs or Organizations: Not on file  . Attends Archivist Meetings: Not on file  .  Marital Status: Not on file  Intimate Partner Violence:   . Fear of Current or Ex-Partner: Not on file  . Emotionally Abused: Not on file  . Physically Abused: Not on file  . Sexually Abused: Not on file    Family History  Problem Relation Age of Onset  . Cancer Mother   . Heart disease Father        Before age 88  . Asthma Father   . Heart disease Brother        After 23 years of age  . Coronary artery disease Unknown        family hx  . Colon cancer Neg Hx   . Esophageal cancer Neg Hx   . Liver cancer Neg Hx   . Pancreatic cancer Neg Hx   . Rectal cancer Neg Hx   . Stomach cancer Neg Hx     Review of Systems:  As stated in the HPI and otherwise negative.   BP 136/78   Pulse (!) 55   Ht 5\' 10"  (1.778 m)   Wt 152 lb 12.8 oz (69.3 kg)   SpO2 98%   BMI 21.92 kg/m   Physical Examination:  General: Well developed, well nourished, NAD  HEENT: OP clear, mucus membranes moist  SKIN: warm, dry. No rashes. Neuro: No focal deficits  Musculoskeletal: Muscle strength 5/5 all ext  Psychiatric: Mood and affect normal  Neck: No JVD, no carotid bruits, no thyromegaly, no lymphadenopathy.  Lungs:Clear bilaterally, no wheezes, rhonci, crackles Cardiovascular: Regular rate and rhythm. No murmurs, gallops or rubs. Abdomen:Soft. Bowel sounds present. Non-tender.  Extremities: No lower extremity edema. Pulses are 2 + in the bilateral DP/PT.  Echo 02/28/18: Left ventricle: The cavity size was normal. Wall thickness was   increased in a pattern of mild LVH. There was mild focal basal   hypertrophy of the septum. Systolic function was normal. The    estimated ejection fraction was in the range of 55% to 60%. Wall   motion was normal; there were no regional wall motion   abnormalities. Doppler parameters are consistent with abnormal   left ventricular relaxation (grade 1 diastolic dysfunction). - Aortic valve: There was trivial regurgitation. - Aortic root: The aortic root was mildly dilated. - Mitral valve: Calcified annulus.  Impressions:  - Normal LV systolic function; mild LVH; mild diastolic   dysfunction; trace AI; mildly dilated aortic root.  EKG:  EKG is not ordered today. The ekg ordered today demonstrates  Recent Labs: No results found for requested labs within last 8760 hours.   Lipid Panel    Component Value Date/Time   CHOL (H) 05/18/2009 0550    237        ATP III CLASSIFICATION:  <200     mg/dL   Desirable  200-239  mg/dL   Borderline High  >=240    mg/dL   High          TRIG 128 05/18/2009 0550   HDL 38 (L) 05/18/2009 0550   CHOLHDL 6.2 05/18/2009 0550   VLDL 26 05/18/2009 0550   LDLCALC (H) 05/18/2009 0550    173        Total Cholesterol/HDL:CHD Risk Coronary Heart Disease Risk Table                     Men   Women  1/2 Average Risk   3.4   3.3  Average Risk       5.0   4.4  2 X Average Risk   9.6   7.1  3 X Average Risk  23.4   11.0        Use the calculated Patient Ratio above and the CHD Risk Table to determine the patient's CHD Risk.        ATP III CLASSIFICATION (LDL):  <100     mg/dL   Optimal  100-129  mg/dL   Near or Above                    Optimal  130-159  mg/dL   Borderline  160-189  mg/dL   High  >190     mg/dL   Very High     Wt Readings from Last 3 Encounters:  02/05/20 152 lb 12.8 oz (69.3 kg)  03/03/19 155 lb 12.8 oz (70.7 kg)  12/31/18 153 lb (69.4 kg)     Other studies Reviewed: Additional studies/ records that were reviewed today include: old records Review of the above records demonstrates:    Assessment and Plan:   1. Paroxysmal atrial fibrillation: Rate  controlled. Continue flecainide and ASA. I worry that his recent event could have been a TIA. He does not tolerate Xarelto, Eliquis or Pradaxa due to a rash. I have reviewed indication for coumadin. He will think about this.   2. CAD without angina: No chest pain. Continue beta blocker and statin  3. Carotid artery disease: Moderate bilateral disease by dopplers in 2020. Recent studies at [facility]. Will get records from there.    4. HTN: BP is controlled. No changes  5. Possible TIA: Continue ASA  Current medicines are reviewed at length with the patient today.  The patient does not have concerns regarding medicines.  The following changes have been made:  no change  Labs/ tests ordered today include:   No orders of the defined types were placed in this encounter.  Disposition:   F/U with me in 6 months.   Signed, Lauree Chandler, MD 02/05/2020 12:35 PM    [address], [place], [place]  [address] Phone: [phone]; Fax: [phone]

## 2020-02-09 DIAGNOSIS — N184 Chronic kidney disease, stage 4 (severe): Secondary | ICD-10-CM | POA: Diagnosis not present

## 2020-02-09 DIAGNOSIS — I480 Paroxysmal atrial fibrillation: Secondary | ICD-10-CM | POA: Diagnosis not present

## 2020-02-09 DIAGNOSIS — Z6822 Body mass index (BMI) 22.0-22.9, adult: Secondary | ICD-10-CM | POA: Diagnosis not present

## 2020-02-09 DIAGNOSIS — Z79899 Other long term (current) drug therapy: Secondary | ICD-10-CM | POA: Diagnosis not present

## 2020-02-09 DIAGNOSIS — G459 Transient cerebral ischemic attack, unspecified: Secondary | ICD-10-CM | POA: Diagnosis not present

## 2020-02-09 DIAGNOSIS — E785 Hyperlipidemia, unspecified: Secondary | ICD-10-CM | POA: Diagnosis not present

## 2020-02-09 DIAGNOSIS — I6523 Occlusion and stenosis of bilateral carotid arteries: Secondary | ICD-10-CM | POA: Diagnosis not present

## 2020-02-09 DIAGNOSIS — I10 Essential (primary) hypertension: Secondary | ICD-10-CM | POA: Diagnosis not present

## 2020-03-08 DIAGNOSIS — R195 Other fecal abnormalities: Secondary | ICD-10-CM | POA: Diagnosis not present

## 2020-03-08 DIAGNOSIS — K921 Melena: Secondary | ICD-10-CM | POA: Diagnosis not present

## 2020-03-08 DIAGNOSIS — Z6822 Body mass index (BMI) 22.0-22.9, adult: Secondary | ICD-10-CM | POA: Diagnosis not present

## 2020-03-08 DIAGNOSIS — N184 Chronic kidney disease, stage 4 (severe): Secondary | ICD-10-CM | POA: Diagnosis not present

## 2020-03-11 DIAGNOSIS — K921 Melena: Secondary | ICD-10-CM | POA: Diagnosis not present

## 2020-03-16 ENCOUNTER — Ambulatory Visit: Payer: PPO | Admitting: Gastroenterology

## 2020-03-16 ENCOUNTER — Encounter: Payer: Self-pay | Admitting: Gastroenterology

## 2020-03-16 VITALS — BP 134/76 | HR 57 | Ht 70.0 in | Wt 160.2 lb

## 2020-03-16 DIAGNOSIS — K921 Melena: Secondary | ICD-10-CM | POA: Diagnosis not present

## 2020-03-16 MED ORDER — PANTOPRAZOLE SODIUM 20 MG PO TBEC: 20.0000 mg | DELAYED_RELEASE_TABLET | Freq: Every day | ORAL | 3 refills | Status: DC

## 2020-03-16 NOTE — Patient Instructions (Signed)
If you are age 73 or older, your body mass index should be between 23-30. Your Body mass index is 22.99 kg/m. If this is out of the aforementioned range listed, please consider follow up with your Primary Care Provider.  If you are age 32 or younger, your body mass index should be between 19-25. Your Body mass index is 22.99 kg/m. If this is out of the aformentioned range listed, please consider follow up with your Primary Care Provider.   We have sent the following medications to your pharmacy for you to pick up at your convenience: Protonix 20 mg   You have been scheduled for an endoscopy. Please follow written instructions given to you at your visit today. If you use inhalers (even only as needed), please bring them with you on the day of your procedure.  Thank you,  Dr. Jackquline Denmark

## 2020-03-16 NOTE — Progress Notes (Signed)
Chief Complaint: melena  Referring Provider:  Lowella Dandy, NP      ASSESSMENT AND PLAN;   #1. Melena (off AC, also d/t rash), likely d/t UGI bleed (resolved). Hb 11.6 03/08/2020.  #2. Has associated A. Fib, CAD, CKD, HTN, anemia of chronic disease, HLD, H/O TIA  Plan: -EGD for further evaluation.  Discussed risks and benefits. -Must continue Protonix 20mg  QD for now. -Can try baby ASA.  He is very reluctant to use any anticoagulation.  He does understand long-term risks of stroke.  This has been discussed by Dr. Angelena Form as well. -He and his wife are to promptly get in touch with [place] in case of any recurrence of melena.  He is under excellent care of [place] FNP.  HPI:    Jack [name] is a 74 y.o. male  With H/O A Fib (has not been able to tolerate any DOACs d/t rash) with one ?TIA, CKD (Cr 1.96 with GFR 33 mL/min), HTN, CAD, OA, HLD Was again given a trial of Xarelto.  He again had rash.  After a month he started having dark black melanotic stools.  Xarelto was immediately stopped.  Seen by Laverna Peace.  Had heme positive stools.  Started on Protonix.  Hb had remained stable at 11.6 on 03/09/2020.  Over the last 4 to 5 days his melena has completely resolved.  His stool is back to normal.  He denies having any abdominal pain.  No nonsteroidals.  Never had ulcers or any upper GI evaluation in the past.  Sent to GI clinic for further evaluation by means of EGD.  He does not want to go on Coumadin.  Has been on regular aspirin which caused him to have significant bruises.  He is willing to try baby aspirin but that is as far as he wants to go.  He has appointment coming up with cardiology in next few weeks.  He understands the risks of stroke.  Past GI procedures -Colonoscopy 10/17/2017: Mild pancolonic diverticulosis, nonbleeding internal hemorrhoids.  No need to repeat unless problems.  SH: Married, wife is a patient of ours-we diagnosed her with colon cancer  previously.     Past Medical History:  Diagnosis Date  . Allergy   . Arthritis    psoriatic arthritis   . Asthma   . 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 Atheroembolism   . CAD (coronary artery disease)    hx non obst  . Cancer ([place])    basal cell x3 , last one removed - 12/12/2013, R temple area of forehead  . Chronic kidney disease    renal stones - tx. /w lithotripsy  . Complication of anesthesia   . GERD (gastroesophageal reflux disease)   . HLD (hyperlipidemia)   . HTN (hypertension)   . Hypoglycemia   . Paroxysmal atrial fibrillation (HCC)   . PONV (postoperative nausea and vomiting)   . Pruritus   . Psoriasis   . Shortness of breath    after eating , if he gets active, he might have some SOB  . Vitamin D deficiency     Past Surgical History:  Procedure Laterality Date  . AV FISTULA PLACEMENT  07-2009   Left Brachiocephalic AVF  . CARDIAC CATHETERIZATION  2010  . COLONOSCOPY  09/15/2012   Colonic polyp status post polypectomy. Mild sigmoid diverticulosis. Small internal hemorrhoids. Otherwise normal colonoscopy   . LIGATION OF ARTERIOVENOUS  FISTULA Left 12/30/2013   Procedure: EXCISION OF LEFT ARM  ARTERIOVENOUS  FISTULA;  Surgeon: Rosetta Posner, MD;  Location: [place];  Service: Vascular;  Laterality: Left;  . LITHOTRIPSY    . skin cancer removed     multiple basal cell    Family History  Problem Relation Age of Onset  . Cancer Mother   . Heart disease Father        Before age 98  . Asthma Father   . Heart disease Brother        After 62 years of age  . Coronary artery disease Other        family hx  . Colon cancer Neg Hx   . Esophageal cancer Neg Hx   . Liver cancer Neg Hx   . Pancreatic cancer Neg Hx   . Rectal cancer Neg Hx   . Stomach cancer Neg Hx     Social History   Tobacco Use  . Smoking status: Former Smoker    Quit date: 05/21/1970    Years since quitting: 49.8  . Smokeless tobacco: Never Used  . Tobacco comment: quit 40+ years ago    Substance Use Topics  . Alcohol use: No  . Drug use: No    Current Outpatient Medications  Medication Sig Dispense Refill  . Acetaminophen (APAP) 325 MG tablet Take 650 mg by mouth every 4 (four) hours as needed for pain or fever. Take 2    . amLODipine (NORVASC) 10 MG tablet Take 10 mg by mouth daily with breakfast.    . betamethasone dipropionate (DIPROLENE) 0.05 % cream Apply 1 application topically 2 (two) times daily as needed (for psoriasis).    . flecainide (TAMBOCOR) 50 MG tablet Take 50 mg by mouth in the morning and at bedtime.    Marland Kitchen labetalol (NORMODYNE) 200 MG tablet Take 300 mg by mouth once.     . loratadine (CLARITIN) 10 MG tablet Take 10 mg by mouth daily.    Marland Kitchen OVER THE COUNTER MEDICATION Calcium with Vitamin D 3 plus minerals. One tablet everyday.    Marland Kitchen OVER THE COUNTER MEDICATION Co Q 10, 200 mg daily.    . pravastatin (PRAVACHOL) 40 MG tablet Take 40 mg by mouth daily.     . Triamcinolone Acetonide (KENALOG IJ) Inject 1 application as directed as directed. 3-4 months    . aspirin 325 MG EC tablet Take 325 mg by mouth daily. (Patient not taking: Reported on 03/16/2020)    . pantoprazole (PROTONIX) 40 MG tablet Take 40 mg by mouth daily.     Current Facility-Administered Medications  Medication Dose Route Frequency Provider Last Rate Last Admin  . 0.9 %  sodium chloride infusion  500 mL Intravenous Once Jackquline Denmark, MD        Allergies  Allergen Reactions  . Hydrocodone Nausea Only  . Oxycodone Nausea And Vomiting  . Eliquis [Apixaban]     Itching and rash  . Hydrochlorothiazide     anxious  . Hytrin [Terazosin Hcl]     Heart races  . Pradaxa [Dabigatran Etexilate Mesylate]     Itching and rash   . Sulfa Antibiotics Itching  . Xarelto [Rivaroxaban]     Itching and rash     Review of Systems:  Constitutional: Denies fever, chills, diaphoresis, appetite change and fatigue.  HEENT: Denies photophobia, eye pain, redness, hearing loss, ear pain, congestion,  sore throat, rhinorrhea, sneezing, mouth sores, neck pain, neck stiffness and tinnitus.   Respiratory: Denies SOB, DOE, cough, chest tightness,  and wheezing.  Cardiovascular: Denies chest pain, palpitations and leg swelling.  Genitourinary: Denies dysuria, urgency, frequency, hematuria, flank pain and difficulty urinating.  Musculoskeletal: Denies myalgias, back pain, joint swelling, arthralgias and gait problem.  Skin: No rash.  Neurological: Denies dizziness, seizures, syncope, weakness, light-headedness, numbness and headaches.  Hematological: Denies adenopathy. Easy bruising, personal or family bleeding history  Psychiatric/Behavioral: No anxiety or depression     Physical Exam:    BP 134/76   Pulse (!) 57   Ht 5\' 10"  (1.778 m)   Wt 160 lb 4 oz (72.7 kg)   BMI 22.99 kg/m  Wt Readings from Last 3 Encounters:  03/16/20 160 lb 4 oz (72.7 kg)  02/05/20 152 lb 12.8 oz (69.3 kg)  03/03/19 155 lb 12.8 oz (70.7 kg)   Constitutional:  Well-developed, in no acute distress. Psychiatric: Normal mood and affect. Behavior is normal. HEENT: Pupils normal.  Conjunctivae are normal. No scleral icterus. Neck supple.  Cardiovascular: Normal rate, regular rhythm. No edema Pulmonary/chest: Effort normal and breath sounds normal. No wheezing, rales or rhonchi. Abdominal: Soft, nondistended. Nontender. Bowel sounds active throughout. There are no masses palpable. No hepatomegaly. Rectal:  defered Neurological: Alert and oriented to person place and time. Skin: Skin is warm and dry. No rashes noted.  Data Reviewed: I have personally reviewed following labs and imaging studies  CBC: CBC Latest Ref Rng & Units 12/24/2013 08/02/2009 06/02/2009  WBC 4.0 - 10.5 K/uL 8.4 - 8.3  Hemoglobin 13.0 - 17.0 g/dL 15.5 12.6(L) 13.7  Hematocrit 39 - 52 % 45.6 37.0(L) 38.8(L)  Platelets 150 - 400 K/uL 225 - 181    CMP: CMP Latest Ref Rng & Units 12/24/2013 08/02/2009 06/04/2009  Glucose 70 - 99 mg/dL 87 106(H)  99  BUN 6 - 23 mg/dL 27(H) - 23  Creatinine 0.50 - 1.35 mg/dL 1.45(H) 3.82(H) 2.05(H)  Sodium 137 - 147 mEq/L 142 140 139  Potassium 3.7 - 5.3 mEq/L 4.5 3.5 4.0  Chloride 96 - 112 mEq/L 103 - 108  CO2 19 - 32 mEq/L 26 - 26  Calcium 8.4 - 10.5 mg/dL 9.2 - 8.7  Total Protein 6.0 - 8.3 g/dL - - -  Total Bilirubin 0.3 - 1.2 mg/dL - - -  Alkaline Phos 39 - 117 U/L - - -  AST 0 - 37 U/L - - -  ALT 0 - 53 U/L - - -       Carmell Austria, MD 03/16/2020, 11:50 AM  Cc: Lowella Dandy, NP

## 2020-03-17 DIAGNOSIS — K921 Melena: Secondary | ICD-10-CM | POA: Diagnosis not present

## 2020-03-28 DIAGNOSIS — D539 Nutritional anemia, unspecified: Secondary | ICD-10-CM | POA: Diagnosis not present

## 2020-03-29 DIAGNOSIS — L820 Inflamed seborrheic keratosis: Secondary | ICD-10-CM | POA: Diagnosis not present

## 2020-04-05 ENCOUNTER — Encounter: Payer: Self-pay | Admitting: Gastroenterology

## 2020-04-11 DIAGNOSIS — D539 Nutritional anemia, unspecified: Secondary | ICD-10-CM | POA: Diagnosis not present

## 2020-04-19 ENCOUNTER — Ambulatory Visit (AMBULATORY_SURGERY_CENTER): Payer: PPO | Admitting: Gastroenterology

## 2020-04-19 ENCOUNTER — Encounter: Payer: Self-pay | Admitting: Gastroenterology

## 2020-04-19 ENCOUNTER — Other Ambulatory Visit: Payer: Self-pay

## 2020-04-19 VITALS — BP 144/77 | HR 62 | Temp 97.7°F | Resp 10 | Ht 70.0 in | Wt 160.0 lb

## 2020-04-19 DIAGNOSIS — K2970 Gastritis, unspecified, without bleeding: Secondary | ICD-10-CM | POA: Diagnosis not present

## 2020-04-19 DIAGNOSIS — K2951 Unspecified chronic gastritis with bleeding: Secondary | ICD-10-CM | POA: Diagnosis not present

## 2020-04-19 DIAGNOSIS — K921 Melena: Secondary | ICD-10-CM | POA: Diagnosis not present

## 2020-04-19 DIAGNOSIS — K219 Gastro-esophageal reflux disease without esophagitis: Secondary | ICD-10-CM

## 2020-04-19 DIAGNOSIS — K449 Diaphragmatic hernia without obstruction or gangrene: Secondary | ICD-10-CM | POA: Diagnosis not present

## 2020-04-19 MED ORDER — SODIUM CHLORIDE 0.9 % IV SOLN: 500.0000 mL | Freq: Once | INTRAVENOUS | Status: DC

## 2020-04-19 NOTE — Progress Notes (Signed)
VS taken by C.W. 

## 2020-04-19 NOTE — Progress Notes (Signed)
Called to room to assist during endoscopic procedure.  Patient ID and intended procedure confirmed with present staff. Received instructions for my participation in the procedure from the performing physician.  

## 2020-04-19 NOTE — Patient Instructions (Signed)
Please read handouts provided. Continue present medications, including Protonix 20 mg once a day. Await pathology results. Can continue baby aspirin. Can resume anticoagulation.     YOU HAD AN ENDOSCOPIC PROCEDURE TODAY AT [facility]:   Refer to the procedure report that was given to you for any specific questions about what was found during the examination.  If the procedure report does not answer your questions, please call your gastroenterologist to clarify.  If you requested that your care partner not be given the details of your procedure findings, then the procedure report has been included in a sealed envelope for you to review at your convenience later.  YOU SHOULD EXPECT: Some feelings of bloating in the abdomen. Passage of more gas than usual.  Walking can help get rid of the air that was put into your GI tract during the procedure and reduce the bloating. If you had a lower endoscopy (such as a colonoscopy or flexible sigmoidoscopy) you may notice spotting of blood in your stool or on the toilet paper. If you underwent a bowel prep for your procedure, you may not have a normal bowel movement for a few days.  Please Note:  You might notice some irritation and congestion in your nose or some drainage.  This is from the oxygen used during your procedure.  There is no need for concern and it should clear up in a day or so.  SYMPTOMS TO REPORT IMMEDIATELY:    Following upper endoscopy (EGD)  Vomiting of blood or coffee ground material  New chest pain or pain under the shoulder blades  Painful or persistently difficult swallowing  New shortness of breath  Fever of 100F or higher  Black, tarry-looking stools  For urgent or emergent issues, a gastroenterologist can be reached at any hour by calling [phone]. Do not use MyChart messaging for urgent concerns.    DIET:  We do recommend a small meal at first, but then you may proceed to your regular diet.  Drink  plenty of fluids but you should avoid alcoholic beverages for 24 hours.  ACTIVITY:  You should plan to take it easy for the rest of today and you should NOT DRIVE or use heavy machinery until tomorrow (because of the sedation medicines used during the test).    FOLLOW UP: Our staff will call the number listed on your records 48-72 hours following your procedure to check on you and address any questions or concerns that you may have regarding the information given to you following your procedure. If we do not reach you, we will leave a message.  We will attempt to reach you two times.  During this call, we will ask if you have developed any symptoms of COVID 19. If you develop any symptoms (ie: fever, flu-like symptoms, shortness of breath, cough etc.) before then, please call [phone].  If you test positive for Covid 19 in the 2 weeks post procedure, please call and report this information to [place].    If any biopsies were taken you will be contacted by phone or by letter within the next 1-3 weeks.  Please call us at [phone] if you have not heard about the biopsies in 3 weeks.    SIGNATURES/CONFIDENTIALITY: You and/or your care partner have signed paperwork which will be entered into your electronic medical record.  These signatures attest to the fact that that the information above on your After Visit Summary has been reviewed and is understood.  Full responsibility of the confidentiality of this discharge information lies with you and/or your care-partner.

## 2020-04-19 NOTE — Op Note (Signed)
[place] Patient Name: Jack [name] Procedure Date: 04/19/2020 10:10 AM MRN: [id number] Endoscopist: Jackquline Denmark , MD Age: 74 Referring MD:  Date of Birth: 1945-12-07 Gender: Male Account #: [id number] Procedure:                Upper GI endoscopy Indications:              H/O ? Melena. GERD Medicines:                Monitored Anesthesia Care Procedure:                Pre-Anesthesia Assessment:                           - Prior to the procedure, a History and Physical                            was performed, and patient medications and                            allergies were reviewed. The patient's tolerance of                            previous anesthesia was also reviewed. The risks                            and benefits of the procedure and the sedation                            options and risks were discussed with the patient.                            All questions were answered, and informed consent                            was obtained. Prior Anticoagulants: The patient has                            taken no previous anticoagulant or antiplatelet                            agents except for aspirin. ASA Grade Assessment:                            III - A patient with severe systemic disease. After                            reviewing the risks and benefits, the patient was                            deemed in satisfactory condition to undergo the                            procedure.  After obtaining informed consent, the endoscope was                            passed under direct vision. Throughout the                            procedure, the patient's blood pressure, pulse, and                            oxygen saturations were monitored continuously. The                            Endoscope was introduced through the mouth, and                            advanced to the second part of duodenum. The upper                             GI endoscopy was accomplished without difficulty.                            The patient tolerated the procedure well. Scope In: Scope Out: Findings:                 The examined esophagus was normal with well-defined                            Z-line at 40 cm. Examined by NBI.                           A small transient hiatal hernia was present.                           Localized mild inflammation characterized by                            erythema was found in the gastric antrum. Biopsies                            were taken with a cold forceps for histology.                           The examined duodenum was normal. Biopsies for                            histology were taken with a cold forceps for                            evaluation of celiac disease. Complications:            No immediate complications. Estimated Blood Loss:     Estimated blood loss: none. Impression:               - Small hiatal hernia.                           -  Mild gastritis. Biopsied.                           - No evidence of upper GI bleeding. Recommendation:           - Patient has a contact number available for                            emergencies. The signs and symptoms of potential                            delayed complications were discussed with the                            patient. Return to normal activities tomorrow.                            Written discharge instructions were provided to the                            patient.                           - Resume previous diet.                           - Continue present medications including Protonix                            20 mg p.o. once a day. From GI standpoint, can                            resume anticoagulation. However, but is very                            reluctant to do so d/t rash. Can continue baby                            aspirin.                           - Await pathology results.                            - The findings and recommendations were discussed                            with the patient's family. Jackquline Denmark, MD 04/19/2020 10:30:22 AM This report has been signed electronically.

## 2020-04-19 NOTE — Progress Notes (Signed)
To PACU, VSS. Report to [email or web address] 

## 2020-04-21 ENCOUNTER — Telehealth: Payer: Self-pay

## 2020-04-21 NOTE — Telephone Encounter (Signed)
°  Follow up Call-  Call back number 04/19/2020 10/17/2017  Post procedure Call Back phone  # [phone]  Permission to leave phone message Yes Yes  Some recent data might be hidden     Patient questions:  Do you have a fever, pain , or abdominal swelling? No. Pain Score  0 *  Have you tolerated food without any problems? Yes.    Have you been able to return to your normal activities? Yes.    Do you have any questions about your discharge instructions: Diet   No. Medications  No. Follow up visit  No.  Do you have questions or concerns about your Care? No.  Actions: * If pain score is 4 or above: 1. No action needed, pain <4.Have you developed a fever since your procedure? no  2.   Have you had an respiratory symptoms (SOB or cough) since your procedure? no  3.   Have you tested positive for COVID 19 since your procedure no  4.   Have you had any family members/close contacts diagnosed with the COVID 19 since your procedure?  no   If yes to any of these questions please route to Joylene John, RN and Joella Prince, RN

## 2020-04-21 NOTE — Telephone Encounter (Signed)
LVM

## 2020-04-26 DIAGNOSIS — D2239 Melanocytic nevi of other parts of face: Secondary | ICD-10-CM | POA: Diagnosis not present

## 2020-04-26 DIAGNOSIS — I480 Paroxysmal atrial fibrillation: Secondary | ICD-10-CM | POA: Diagnosis not present

## 2020-04-26 DIAGNOSIS — L820 Inflamed seborrheic keratosis: Secondary | ICD-10-CM | POA: Diagnosis not present

## 2020-04-26 DIAGNOSIS — R7309 Other abnormal glucose: Secondary | ICD-10-CM | POA: Diagnosis not present

## 2020-04-26 DIAGNOSIS — D539 Nutritional anemia, unspecified: Secondary | ICD-10-CM | POA: Diagnosis not present

## 2020-04-26 DIAGNOSIS — Z6822 Body mass index (BMI) 22.0-22.9, adult: Secondary | ICD-10-CM | POA: Diagnosis not present

## 2020-04-26 DIAGNOSIS — D485 Neoplasm of uncertain behavior of skin: Secondary | ICD-10-CM | POA: Diagnosis not present

## 2020-04-26 DIAGNOSIS — Z20822 Contact with and (suspected) exposure to covid-19: Secondary | ICD-10-CM | POA: Diagnosis not present

## 2020-04-26 DIAGNOSIS — H539 Unspecified visual disturbance: Secondary | ICD-10-CM | POA: Diagnosis not present

## 2020-04-26 DIAGNOSIS — Z79899 Other long term (current) drug therapy: Secondary | ICD-10-CM | POA: Diagnosis not present

## 2020-04-26 DIAGNOSIS — E538 Deficiency of other specified B group vitamins: Secondary | ICD-10-CM | POA: Diagnosis not present

## 2020-04-26 DIAGNOSIS — N184 Chronic kidney disease, stage 4 (severe): Secondary | ICD-10-CM | POA: Diagnosis not present

## 2020-04-26 DIAGNOSIS — J309 Allergic rhinitis, unspecified: Secondary | ICD-10-CM | POA: Diagnosis not present

## 2020-04-26 DIAGNOSIS — I2510 Atherosclerotic heart disease of native coronary artery without angina pectoris: Secondary | ICD-10-CM | POA: Diagnosis not present

## 2020-04-26 DIAGNOSIS — I10 Essential (primary) hypertension: Secondary | ICD-10-CM | POA: Diagnosis not present

## 2020-04-26 DIAGNOSIS — E785 Hyperlipidemia, unspecified: Secondary | ICD-10-CM | POA: Diagnosis not present

## 2020-04-28 ENCOUNTER — Encounter: Payer: Self-pay | Admitting: Gastroenterology

## 2020-04-28 ENCOUNTER — Telehealth: Payer: Self-pay | Admitting: Gastroenterology

## 2020-04-28 NOTE — Telephone Encounter (Signed)
Spoke to patient to inform her of biopsy results. Dr Lyndel Safe has not reviewed at [facility] time. She will be notified with further recommendations.

## 2020-04-28 NOTE — Telephone Encounter (Signed)
Pt's spouse is requesting a call back from a nurse to discuss the pt's EGD results.

## 2020-06-20 ENCOUNTER — Other Ambulatory Visit: Payer: Self-pay | Admitting: Gastroenterology

## 2020-07-25 ENCOUNTER — Ambulatory Visit: Payer: Medicare Other | Admitting: Cardiovascular Disease

## 2020-07-25 NOTE — Progress Notes (Deleted)
No chief complaint on file.  History of Present Illness: 75 yo male with history of carotid artery disease, chronic kidney disease, CAD, paroxysmal atrial fibrillation, HTN and hyperlipidemia here today for follow up. I saw him as a new patient 02/20/18 for evaluation of atrial fibrillation. He has been followed in the past by Dr. Johnsie Cancel. He had a cardiac cath in 2010 with Dr. Olevia Perches and was found to have mild CAD. He was suspected to have atheroemboli during the cath and had worsened renal insufficiency post cath. He had not been on coumadin. Echo in 2012 showed normal LV systolic function. There was grade 1 diastolic dysfunction. He had an AV fistula placed back in 2011 and this was removed in 2015 as he never progressed to ESRD. He woke up on 02/10/18 and felt weak and his heart was racing. He went in to see Laverna Peace, NP and was found to have atrial fibrillation with HR 100 bpm. He was started on Eliquis. Carotid artery dopplers 11/11/17 with 50-69% narrowing bilateral ICA. I arranged an echo on 02/28/18 which showed normal LV systolic function, FMBW=46-65%. Cardiac monitor with atrial fib. He has been followed by Dr. Rayann Heman. He could not tolerate Eliquis or Xarelto due to a rash. *** He did tolerate Pradaxa.  He was admitted to [facility] on 01/26/20 with slurred speech, swallowing trouble. He thought he was having a stroke. Head CT with no bleeding. Brain MRI with no evidence of stroke per pt. Records from [facility]: EKG 01/26/20 with NSR. CT head 01/26/20; no acute bleeding or infarct. Echo 01/26/20; LVEF=55-60%. Trace AI, MR, TR. Carotid artery dopplers 01/26/20: RICA 99-35% stenosis, LICA 70% stenosis. Creatinine 2.0  Troponin negative x 2.   He is here today for follow up. The patient denies any chest pain, dyspnea, palpitations, lower extremity edema, orthopnea, PND, dizziness, near syncope or syncope.      Primary Care Physician: Lowella Dandy, NP  Past Medical History:  Diagnosis Date  .  Allergy   . Arthritis    psoriatic arthritis   . Asthma   . 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 Atheroembolism   . CAD (coronary artery disease)    hx non obst  . Cancer ([place])    basal cell x3 , last one removed - 12/12/2013, R temple area of forehead  . Chronic kidney disease    renal stones - tx. /w lithotripsy  . Complication of anesthesia   . GERD (gastroesophageal reflux disease)   . HLD (hyperlipidemia)   . HTN (hypertension)   . Hypoglycemia   . Paroxysmal atrial fibrillation ([facility])   . PONV (postoperative nausea and vomiting)   . Pruritus   . Psoriasis   . Shortness of breath    after eating , if he gets active, he might have some SOB  . Vitamin D deficiency     Past Surgical History:  Procedure Laterality Date  . AV FISTULA PLACEMENT  07-2009   Left Brachiocephalic AVF  . CARDIAC CATHETERIZATION  2010  . COLONOSCOPY  09/15/2012   Colonic polyp status post polypectomy. Mild sigmoid diverticulosis. Small internal hemorrhoids. Otherwise normal colonoscopy   . LIGATION OF ARTERIOVENOUS  FISTULA Left 12/30/2013   Procedure: EXCISION OF LEFT ARM ARTERIOVENOUS  FISTULA;  Surgeon: Rosetta Posner, MD;  Location: [place];  Service: Vascular;  Laterality: Left;  . LITHOTRIPSY    . skin cancer removed     multiple basal cell    Current Outpatient Medications  Medication Sig Dispense Refill  . Acetaminophen (APAP) 325 MG tablet Take 650 mg by mouth every 4 (four) hours as needed for pain or fever. Take 2    . AMBULATORY NON FORMULARY MEDICATION Bone Restore    . AMBULATORY NON FORMULARY MEDICATION Medication Name: Bio-Fisetin    . AMBULATORY NON FORMULARY MEDICATION once a week. 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 amLODipine (NORVASC) 10 MG tablet Take 10 mg by mouth daily with breakfast.    . betamethasone dipropionate (DIPROLENE) 0.05 % cream Apply 1 application topically 2 (two) times daily as needed (for psoriasis).    . flecainide (TAMBOCOR) 50 MG  tablet Take 50 mg by mouth in the morning and at bedtime.    Marland Kitchen labetalol (NORMODYNE) 200 MG tablet Take 300 mg by mouth once.     . loratadine (CLARITIN) 10 MG tablet Take 10 mg by mouth daily.    . Omega-3 Fatty Acids (FISH OIL) 1000 MG CAPS Take 1 capsule by mouth daily.    Marland Kitchen OVER THE COUNTER MEDICATION Calcium with Vitamin D 3 plus minerals. One tablet everyday.    Marland Kitchen OVER THE COUNTER MEDICATION Co Q 10, 200 mg daily.    . pantoprazole (PROTONIX) 20 MG tablet TAKE 1 TABLET BY MOUTH DAILY. 30 tablet 3  . pravastatin (PRAVACHOL) 40 MG tablet Take 40 mg by mouth daily.     . Triamcinolone Acetonide (KENALOG IJ) Inject 1 application as directed as directed. 3-4 months     No current facility-administered medications for this visit.    Allergies  Allergen Reactions  . Hydrocodone Nausea Only  . Oxycodone Nausea And Vomiting  . Eliquis [Apixaban]     Itching and rash  . Hydrochlorothiazide     anxious  . Hytrin [Terazosin Hcl]     Heart races  . Pradaxa [Dabigatran Etexilate Mesylate]     Itching and rash   . Sulfa Antibiotics Itching  . Xarelto [Rivaroxaban]     Itching and rash     Social History   Socioeconomic History  . Marital status: Married    Spouse name: Not on file  . Number of children: 0  . Years of education: Not on file  . Highest education level: Not on file  Occupational History  . Occupation: Retired from a Engineer, materials  Tobacco Use  . Smoking status: Former Smoker    Quit date: 05/21/1970    Years since quitting: 50.2  . Smokeless tobacco: Never Used  . Tobacco comment: quit 40+ years ago   Vaping Use  . Vaping Use: Never used  Substance and Sexual Activity  . Alcohol use: No  . Drug use: No  . Sexual activity: Not on file  Other Topics Concern  . Not on file  Social History Narrative   Married, lives with wife; Loss adjuster, chartered.   Lives in [facility] [place]   Social Determinants of Health   Financial Resource Strain: Not on file  Food  Insecurity: Not on file  Transportation Needs: Not on file  Physical Activity: Not on file  Stress: Not on file  Social Connections: Not on file  Intimate Partner Violence: Not on file    Family History  Problem Relation Age of Onset  . Cancer Mother   . Heart disease Father        Before age 46  . Asthma Father   . Heart disease Brother        After 60 years  of age  . Coronary artery disease Other        family hx  . Colon cancer Neg Hx   . Esophageal cancer Neg Hx   . Liver cancer Neg Hx   . Pancreatic cancer Neg Hx   . Rectal cancer Neg Hx   . Stomach cancer Neg Hx     Review of Systems:  As stated in the HPI and otherwise negative.   There were no vitals taken for this visit.  Physical Examination:  General: Well developed, well nourished, NAD  HEENT: OP clear, mucus membranes moist  SKIN: warm, dry. No rashes. Neuro: No focal deficits  Musculoskeletal: Muscle strength 5/5 all ext  Psychiatric: Mood and affect normal  Neck: No JVD, no carotid bruits, no thyromegaly, no lymphadenopathy.  Lungs:Clear bilaterally, no wheezes, rhonci, crackles Cardiovascular: Regular rate and rhythm. No murmurs, gallops or rubs. Abdomen:Soft. Bowel sounds present. Non-tender.  Extremities: No lower extremity edema. Pulses are 2 + in the bilateral DP/PT.  Echo 02/28/18: Left ventricle: The cavity size was normal. Wall thickness was   increased in a pattern of mild LVH. There was mild focal basal   hypertrophy of the septum. Systolic function was normal. The   estimated ejection fraction was in the range of 55% to 60%. Wall   motion was normal; there were no regional wall motion   abnormalities. Doppler parameters are consistent with abnormal   left ventricular relaxation (grade 1 diastolic dysfunction). - Aortic valve: There was trivial regurgitation. - Aortic root: The aortic root was mildly dilated. - Mitral valve: Calcified annulus.  Impressions:  - Normal LV systolic  function; mild LVH; mild diastolic   dysfunction; trace AI; mildly dilated aortic root.  EKG:  EKG is *** ordered today. The ekg ordered today demonstrates  Recent Labs: No results found for requested labs within last 8760 hours.   Lipid Panel: Followed in primary care   Wt Readings from Last 3 Encounters:  04/19/20 160 lb (72.6 kg)  03/16/20 160 lb 4 oz (72.7 kg)  02/05/20 152 lb 12.8 oz (69.3 kg)     Other studies Reviewed: Additional studies/ records that were reviewed today include: old records Review of the above records demonstrates:    Assessment and Plan:   1. Paroxysmal atrial fibrillation: He is in rate controlled atrial fibrillation today. He does not tolerate Xarelto, Eliquis or Pradaxa due to a rash. I have reviewed indication for coumadin again today. He does not want to start anti-coagulation. Continue flecainide and *** ASA.    2. CAD without angina: He has no chest pain. Continue beta blocker and statin.   3. Carotid artery disease: Moderate bilateral disease by dopplers in 2020. Recent studies at [facility] 01/26/20 with stable disease  4. HTN: BP is well controlled. No changes today   Current medicines are reviewed at length with the patient today.  The patient does not have concerns regarding medicines.  The following changes have been made:  no change  Labs/ tests ordered today include:   No orders of the defined types were placed in this encounter.  Disposition:   F/U with me in 6 months.   Signed, Lauree Chandler, MD 07/25/2020 7:15 AM    [address], [place], [place]  [address] Phone: [phone]; Fax: [phone]

## 2020-07-26 ENCOUNTER — Ambulatory Visit: Payer: Medicare Other | Admitting: Cardiovascular Disease

## 2020-07-26 ENCOUNTER — Encounter: Payer: Self-pay | Admitting: Cardiovascular Disease

## 2020-07-26 ENCOUNTER — Other Ambulatory Visit: Payer: Self-pay

## 2020-07-26 VITALS — BP 122/72 | HR 69 | Ht 70.0 in | Wt 157.2 lb

## 2020-07-26 DIAGNOSIS — I6523 Occlusion and stenosis of bilateral carotid arteries: Secondary | ICD-10-CM

## 2020-07-26 DIAGNOSIS — I480 Paroxysmal atrial fibrillation: Secondary | ICD-10-CM | POA: Diagnosis not present

## 2020-07-26 DIAGNOSIS — I10 Essential (primary) hypertension: Secondary | ICD-10-CM | POA: Diagnosis not present

## 2020-07-26 DIAGNOSIS — I2510 Atherosclerotic heart disease of native coronary artery without angina pectoris: Secondary | ICD-10-CM

## 2020-07-26 NOTE — Patient Instructions (Signed)

## 2020-07-26 NOTE — Progress Notes (Signed)
Chief Complaint  Patient presents with  . Follow-up    Atrial fibrillation   History of Present Illness:  75 yo male with history of carotid artery disease, chronic kidney disease, CAD, paroxysmal atrial fibrillation, HTN and hyperlipidemia here today for follow up. I saw him as a new patient 02/20/18 for evaluation of atrial fibrillation. He has been followed in the past by Dr. Johnsie Cancel. He had a cardiac cath in 2010 with Dr. Olevia Perches and was found to have mild CAD. He was suspected to have atheroemboli during the cath and had worsened renal insufficiency post cath. He had not been on coumadin. Echo in 2012 showed normal LV systolic function. There was grade 1 diastolic dysfunction. He had an AV fistula placed back in 2011 and this was removed in 2015 as he never progressed to ESRD. He woke up on 02/10/18 and felt weak and his heart was racing. He went in to see Laverna Peace, NP and was found to have atrial fibrillation with HR 100 bpm. He did not tolerate Eliquis, Pradaxa or Xarelto due to a rash. I arranged an echo on 02/28/18 which showed normal LV systolic function, FTDD=22-02%. Cardiac monitor with atrial fib. He was seen in EP clinic by Dr. Rayann Heman and was started on Flecainide. Carotid artery dopplers 11/11/17 with 50-69% narrowing bilateral ICA.  He was admitted to [facility] on 01/26/20 with slurred speech, swallowing trouble. He thought he was having a stroke. Head CT with no bleeding. Brain MRI with no evidence of stroke per pt. Records from [facility]: EKG 01/26/20 with NSR. CT head 01/26/20; no acute bleeding or infarct. Echo 01/26/20; LVEF=55-60%. Trace AI, MR, TR. Carotid artery dopplers 01/26/20: RICA 54-27% stenosis, LICA 06% stenosis. Creatinine 2.0  Troponin negative x 2.  He has refused to take coumadin and does not wish to try any of the DOACs again.   He is here today for follow up. The patient denies any chest pain, dyspnea, palpitations, lower extremity edema, orthopnea, PND, dizziness,  near syncope or syncope.    Primary Care Physician: Lowella Dandy, NP  Past Medical History:  Diagnosis Date  . Allergy   . Arthritis    psoriatic arthritis   . Asthma   . 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 Atheroembolism   . CAD (coronary artery disease)    hx non obst  . Cancer ([place])    basal cell x3 , last one removed - 12/12/2013, R temple area of forehead  . Chronic kidney disease    renal stones - tx. /w lithotripsy  . Complication of anesthesia   . GERD (gastroesophageal reflux disease)   . HLD (hyperlipidemia)   . HTN (hypertension)   . Hypoglycemia   . Paroxysmal atrial fibrillation (HCC)   . PONV (postoperative nausea and vomiting)   . Pruritus   . Psoriasis   . Shortness of breath    after eating , if he gets active, he might have some SOB  . Vitamin D deficiency     Past Surgical History:  Procedure Laterality Date  . AV FISTULA PLACEMENT  07-2009   Left Brachiocephalic AVF  . CARDIAC CATHETERIZATION  2010  . COLONOSCOPY  09/15/2012   Colonic polyp status post polypectomy. Mild sigmoid diverticulosis. Small internal hemorrhoids. Otherwise normal colonoscopy   . LIGATION OF ARTERIOVENOUS  FISTULA Left 12/30/2013   Procedure: EXCISION OF LEFT ARM ARTERIOVENOUS  FISTULA;  Surgeon: Rosetta Posner, MD;  Location: [facility];  Service: Vascular;  Laterality: Left;  . LITHOTRIPSY    . skin cancer removed     multiple basal cell    Current Outpatient Medications  Medication Sig Dispense Refill  . Acetaminophen (APAP) 325 MG tablet Take 650 mg by mouth every 4 (four) hours as needed for pain or fever. Take 2    . AMBULATORY NON FORMULARY MEDICATION Bone Restore    . AMBULATORY NON FORMULARY MEDICATION Medication Name: Bio-Fisetin    . AMBULATORY NON FORMULARY MEDICATION once a week. 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 amLODipine (NORVASC) 10 MG tablet Take 10 mg by mouth daily with breakfast.    . betamethasone dipropionate (DIPROLENE) 0.05 %  cream Apply 1 application topically 2 (two) times daily as needed (for psoriasis).    . flecainide (TAMBOCOR) 50 MG tablet Take 50 mg by mouth in the morning and at bedtime.    Marland Kitchen labetalol (NORMODYNE) 200 MG tablet Take 300 mg by mouth once.     . loratadine (CLARITIN) 10 MG tablet Take 10 mg by mouth daily.    . Omega-3 Fatty Acids (FISH OIL) 1000 MG CAPS Take 1 capsule by mouth daily.    Marland Kitchen OVER THE COUNTER MEDICATION Calcium with Vitamin D 3 plus minerals. One tablet everyday.    Marland Kitchen OVER THE COUNTER MEDICATION Co Q 10, 200 mg daily.    . pantoprazole (PROTONIX) 20 MG tablet TAKE 1 TABLET BY MOUTH DAILY. 30 tablet 3  . pravastatin (PRAVACHOL) 40 MG tablet Take 40 mg by mouth daily.     . Triamcinolone Acetonide (KENALOG IJ) Inject 1 application as directed as directed. 3-4 months     No current facility-administered medications for this visit.    Allergies  Allergen Reactions  . Hydrocodone Nausea Only  . Oxycodone Nausea And Vomiting  . Eliquis [Apixaban]     Itching and rash  . Hydrochlorothiazide     anxious  . Hytrin [Terazosin Hcl]     Heart races  . Pradaxa [Dabigatran Etexilate Mesylate]     Itching and rash   . Sulfa Antibiotics Itching  . Xarelto [Rivaroxaban]     Itching and rash     Social History   Socioeconomic History  . Marital status: Married    Spouse name: Not on file  . Number of children: 0  . Years of education: Not on file  . Highest education level: Not on file  Occupational History  . Occupation: Retired from a Engineer, materials  Tobacco Use  . Smoking status: Former Smoker    Quit date: 05/21/1970    Years since quitting: 50.2  . Smokeless tobacco: Never Used  . Tobacco comment: quit 40+ years ago   Vaping Use  . Vaping Use: Never used  Substance and Sexual Activity  . Alcohol use: No  . Drug use: No  . Sexual activity: Not on file  Other Topics Concern  . Not on file  Social History Narrative   Married, lives with wife; Loss adjuster, chartered.    Lives in [facility]   Social Determinants of Health   Financial Resource Strain: Not on file  Food Insecurity: Not on file  Transportation Needs: Not on file  Physical Activity: Not on file  Stress: Not on file  Social Connections: Not on file  Intimate Partner Violence: Not on file    Family History  Problem Relation Age of Onset  . Cancer Mother   . Heart disease Father  Before age 14  . Asthma Father   . Heart disease Brother        After 18 years of age  . Coronary artery disease Other        family hx  . Colon cancer Neg Hx   . Esophageal cancer Neg Hx   . Liver cancer Neg Hx   . Pancreatic cancer Neg Hx   . Rectal cancer Neg Hx   . Stomach cancer Neg Hx     Review of Systems:  As stated in the HPI and otherwise negative.   BP 122/72   Pulse 69   Ht 5\' 10"  (1.778 m)   Wt 157 lb 3.2 oz (71.3 kg)   SpO2 99%   BMI 22.56 kg/m   Physical Examination:  General: Well developed, well nourished, NAD  HEENT: OP clear, mucus membranes moist  SKIN: warm, dry. No rashes. Neuro: No focal deficits  Musculoskeletal: Muscle strength 5/5 all ext  Psychiatric: Mood and affect normal  Neck: No JVD, no carotid bruits, no thyromegaly, no lymphadenopathy.  Lungs:Clear bilaterally, no wheezes, rhonci, crackles Cardiovascular: Regular rate and rhythm. No murmurs, gallops or rubs. Abdomen:Soft. Bowel sounds present. Non-tender.  Extremities: No lower extremity edema. Pulses are 2 + in the bilateral DP/PT.  Echo 02/28/18: Left ventricle: The cavity size was normal. Wall thickness was   increased in a pattern of mild LVH. There was mild focal basal   hypertrophy of the septum. Systolic function was normal. The   estimated ejection fraction was in the range of 55% to 60%. Wall   motion was normal; there were no regional wall motion   abnormalities. Doppler parameters are consistent with abnormal   left ventricular relaxation (grade 1 diastolic dysfunction). -  Aortic valve: There was trivial regurgitation. - Aortic root: The aortic root was mildly dilated. - Mitral valve: Calcified annulus.  Impressions:  - Normal LV systolic function; mild LVH; mild diastolic   dysfunction; trace AI; mildly dilated aortic root.  EKG:  EKG is not ordered today. The ekg ordered today demonstrates  Lipids followed in primary care  Recent Labs: No results found for requested labs within last 8760 hours.   Lipid Panel  Wt Readings from Last 3 Encounters:  07/26/20 157 lb 3.2 oz (71.3 kg)  04/19/20 160 lb (72.6 kg)  03/16/20 160 lb 4 oz (72.7 kg)     Other studies Reviewed: Additional studies/ records that were reviewed today include: old records Review of the above records demonstrates:    Assessment and Plan:   1. Paroxysmal atrial fibrillation: He is in sinus today. He does not tolerate Xarelto, Eliquis or Pradaxa due to a rash. I have reviewed indication for coumadin again today. He does not want to start anti-coagulation. I have reviewed his CHADS VASC score of 3 which he understands indicated a 3.2 % risk per year of a stroke. Will continue flecainide and ASA.    2. CAD without angina: He has no chest pain. Continue beta blocker and statin. LDL not at goal of 70 in December 2021. Repeat testing this month in primary care. I would suggest increasing his statin or changing to Crestor if LDL not at goal of 70.   3. Carotid artery disease: Moderate bilateral disease by dopplers in 2020. Recent studies at [facility] [address] 01/26/20 with stable disease  4. HTN: BP is well controlled. No changes today  Current medicines are reviewed at length with the patient today.  The patient does not have concerns  regarding medicines.  The following changes have been made:  no change  Labs/ tests ordered today include:   No orders of the defined types were placed in this encounter.  Disposition:   F/U with me in 6 months.   Signed, Lauree Chandler,  MD 07/26/2020 4:15 PM    [facility] [address], [place], [place]  [address] Phone: [phone]; Fax: [phone]

## 2020-07-27 ENCOUNTER — Ambulatory Visit: Payer: PPO | Admitting: Cardiovascular Disease

## 2020-08-08 ENCOUNTER — Telehealth: Payer: Self-pay | Admitting: Cardiovascular Disease

## 2020-08-08 NOTE — Telephone Encounter (Signed)
Last LDL was 150. Looks like he is on pravastatin 40mg . I do agree with Dr. Camillia Herter last note that he would benefit from a change in statin to rosuvastatin (high intensity). He will still probably need PCSK9i even if he changes to rosuvastatin. Looks like primary care is ordering PCSK9i. Its important for him to know that he needs to stay on statin even if he starts PCSK9i.   Since pt is asking for Dr.McAlhany input-I will route to him

## 2020-08-08 NOTE — Telephone Encounter (Signed)
Spoke with the pts wife and she verbalized understanding of Dr. Camillia Herter recommendation  re: repatha and pravastatin.

## 2020-08-08 NOTE — Telephone Encounter (Signed)
Pt c/o medication issue:  1. Name of Medication: Repatha  2. How are you currently taking this medication (dosage and times per day)? Patient has not started this medication yet  3. Are you having a reaction (difficulty breathing--STAT)? no  4. What is your medication issue? Patient wanted to get Dr. Camillia Herter advice about this medication. The patient wanted to know if DR. McAlhany was OK with the patient takingthis medication

## 2020-08-08 NOTE — Telephone Encounter (Signed)
I agree with Melissa. I am ok with him starting Repatha but should also continue the statin. Gerald Stabs

## 2020-09-20 DIAGNOSIS — I2510 Atherosclerotic heart disease of native coronary artery without angina pectoris: Secondary | ICD-10-CM | POA: Diagnosis not present

## 2020-09-20 DIAGNOSIS — N189 Chronic kidney disease, unspecified: Secondary | ICD-10-CM | POA: Diagnosis not present

## 2020-09-20 DIAGNOSIS — N184 Chronic kidney disease, stage 4 (severe): Secondary | ICD-10-CM | POA: Diagnosis not present

## 2020-09-20 DIAGNOSIS — I129 Hypertensive chronic kidney disease with stage 1 through stage 4 chronic kidney disease, or unspecified chronic kidney disease: Secondary | ICD-10-CM | POA: Diagnosis not present

## 2020-09-20 DIAGNOSIS — N2581 Secondary hyperparathyroidism of renal origin: Secondary | ICD-10-CM | POA: Diagnosis not present

## 2020-10-12 DIAGNOSIS — L820 Inflamed seborrheic keratosis: Secondary | ICD-10-CM | POA: Diagnosis not present

## 2020-10-12 DIAGNOSIS — R233 Spontaneous ecchymoses: Secondary | ICD-10-CM | POA: Diagnosis not present

## 2020-10-12 DIAGNOSIS — D485 Neoplasm of uncertain behavior of skin: Secondary | ICD-10-CM | POA: Diagnosis not present

## 2020-11-04 DIAGNOSIS — E785 Hyperlipidemia, unspecified: Secondary | ICD-10-CM | POA: Diagnosis not present

## 2020-11-04 DIAGNOSIS — D539 Nutritional anemia, unspecified: Secondary | ICD-10-CM | POA: Diagnosis not present

## 2020-11-04 DIAGNOSIS — I10 Essential (primary) hypertension: Secondary | ICD-10-CM | POA: Diagnosis not present

## 2020-11-04 DIAGNOSIS — N184 Chronic kidney disease, stage 4 (severe): Secondary | ICD-10-CM | POA: Diagnosis not present

## 2020-11-04 DIAGNOSIS — I480 Paroxysmal atrial fibrillation: Secondary | ICD-10-CM | POA: Diagnosis not present

## 2020-12-09 DIAGNOSIS — N401 Enlarged prostate with lower urinary tract symptoms: Secondary | ICD-10-CM | POA: Diagnosis not present

## 2020-12-09 DIAGNOSIS — R351 Nocturia: Secondary | ICD-10-CM | POA: Diagnosis not present

## 2020-12-09 DIAGNOSIS — R972 Elevated prostate specific antigen [PSA]: Secondary | ICD-10-CM | POA: Diagnosis not present

## 2020-12-22 DIAGNOSIS — Z9181 History of falling: Secondary | ICD-10-CM | POA: Diagnosis not present

## 2020-12-22 DIAGNOSIS — Z1331 Encounter for screening for depression: Secondary | ICD-10-CM | POA: Diagnosis not present

## 2020-12-22 DIAGNOSIS — Z0000 Encounter for general adult medical examination without abnormal findings: Secondary | ICD-10-CM | POA: Diagnosis not present

## 2020-12-22 DIAGNOSIS — E785 Hyperlipidemia, unspecified: Secondary | ICD-10-CM | POA: Diagnosis not present

## 2021-02-17 DIAGNOSIS — E785 Hyperlipidemia, unspecified: Secondary | ICD-10-CM | POA: Diagnosis not present

## 2021-02-17 DIAGNOSIS — D539 Nutritional anemia, unspecified: Secondary | ICD-10-CM | POA: Diagnosis not present

## 2021-02-17 DIAGNOSIS — N184 Chronic kidney disease, stage 4 (severe): Secondary | ICD-10-CM | POA: Diagnosis not present

## 2021-02-17 DIAGNOSIS — I10 Essential (primary) hypertension: Secondary | ICD-10-CM | POA: Diagnosis not present

## 2021-03-31 DIAGNOSIS — H9202 Otalgia, left ear: Secondary | ICD-10-CM | POA: Diagnosis not present

## 2021-03-31 DIAGNOSIS — H6122 Impacted cerumen, left ear: Secondary | ICD-10-CM | POA: Diagnosis not present

## 2021-05-25 DIAGNOSIS — R21 Rash and other nonspecific skin eruption: Secondary | ICD-10-CM | POA: Diagnosis not present

## 2021-05-25 DIAGNOSIS — L509 Urticaria, unspecified: Secondary | ICD-10-CM | POA: Diagnosis not present

## 2021-06-08 DIAGNOSIS — N184 Chronic kidney disease, stage 4 (severe): Secondary | ICD-10-CM | POA: Diagnosis not present

## 2021-06-08 DIAGNOSIS — I10 Essential (primary) hypertension: Secondary | ICD-10-CM | POA: Diagnosis not present

## 2021-06-08 DIAGNOSIS — E785 Hyperlipidemia, unspecified: Secondary | ICD-10-CM | POA: Diagnosis not present

## 2021-06-08 DIAGNOSIS — D539 Nutritional anemia, unspecified: Secondary | ICD-10-CM | POA: Diagnosis not present

## 2021-06-12 DIAGNOSIS — H524 Presbyopia: Secondary | ICD-10-CM | POA: Diagnosis not present

## 2021-06-14 DIAGNOSIS — H40003 Preglaucoma, unspecified, bilateral: Secondary | ICD-10-CM | POA: Diagnosis not present

## 2021-06-14 DIAGNOSIS — H2513 Age-related nuclear cataract, bilateral: Secondary | ICD-10-CM | POA: Diagnosis not present

## 2021-06-14 DIAGNOSIS — H40033 Anatomical narrow angle, bilateral: Secondary | ICD-10-CM | POA: Diagnosis not present

## 2021-06-14 DIAGNOSIS — H16142 Punctate keratitis, left eye: Secondary | ICD-10-CM | POA: Diagnosis not present

## 2021-08-21 ENCOUNTER — Ambulatory Visit: Payer: Medicare Other | Admitting: Cardiovascular Disease

## 2021-08-21 ENCOUNTER — Encounter: Payer: Self-pay | Admitting: Cardiovascular Disease

## 2021-08-21 VITALS — BP 132/70 | HR 56 | Ht 70.0 in | Wt 162.6 lb

## 2021-08-21 DIAGNOSIS — I6523 Occlusion and stenosis of bilateral carotid arteries: Secondary | ICD-10-CM

## 2021-08-21 DIAGNOSIS — I480 Paroxysmal atrial fibrillation: Secondary | ICD-10-CM

## 2021-08-21 DIAGNOSIS — I2510 Atherosclerotic heart disease of native coronary artery without angina pectoris: Secondary | ICD-10-CM

## 2021-08-21 DIAGNOSIS — I10 Essential (primary) hypertension: Secondary | ICD-10-CM

## 2021-08-21 NOTE — Patient Instructions (Addendum)
Medication Instructions:  No changes *If you need a refill on your cardiac medications before your next appointment, please call your pharmacy*   Lab Work: none If you have labs (blood work) drawn today and your tests are completely normal, you will receive your results only by: MyChart Message (if you have MyChart) OR A paper copy in the mail If you have any lab test that is abnormal or we need to change your treatment, we will call you to review the results.   Testing/Procedures: Your physician has requested that you have a carotid duplex. This test is an ultrasound of the carotid arteries in your neck. It looks at blood flow through these arteries that supply the brain with blood. Allow one hour for this exam. There are no restrictions or special instructions.  Follow-Up: At [facility], you and your health needs are our priority.  As part of our continuing mission to provide you with exceptional heart care, we have created designated Provider Care Teams.  These Care Teams include your primary Cardiologist (physician) and Advanced Practice Providers (APPs -  Physician Assistants and Nurse Practitioners) who all work together to provide you with the care you need, when you need it.  We recommend signing up for the patient portal called "MyChart".  Sign up information is provided on this After Visit Summary.  MyChart is used to connect with patients for Virtual Visits (Telemedicine).  Patients are able to view lab/test results, encounter notes, upcoming appointments, etc.  Non-urgent messages can be sent to your provider as well.   To learn more about what you can do with MyChart, go to [email or web address].    Your next appointment:   12 month(s)  The format for your next appointment:   In Person  Provider:   Christopher McAlhany, MD     Other Instructions   

## 2021-08-21 NOTE — Progress Notes (Signed)
? ?Chief Complaint  ?Patient presents with  ? Follow-up  ?  CAD  ? ?History of Present Illness:  76 yo male with history of carotid artery disease, chronic kidney disease, CAD, paroxysmal atrial fibrillation, HTN and hyperlipidemia here today for follow up. I saw him as a new patient 02/20/18 for evaluation of atrial fibrillation. He has been followed in the past by Dr. Johnsie Cancel. He had a cardiac cath in 2010 with Dr. Olevia Perches and was found to have mild CAD. He was suspected to have atheroemboli during the cath and had worsened renal insufficiency post cath. He had not been on coumadin. Echo in 2012 showed normal LV systolic function. There was grade 1 diastolic dysfunction. He had an AV fistula placed back in 2011 and this was removed in 2015 as he never progressed to ESRD. He woke up on 02/10/18 and felt weak and his heart was racing. He went in to see Laverna Peace, NP and was found to have atrial fibrillation with HR 100 bpm. He did not tolerate Eliquis, Pradaxa or Xarelto due to a rash. I arranged an echo on 02/28/18 which showed normal LV systolic function, LPFX=90-24%. Cardiac monitor with atrial fib. He was seen in EP clinic by Dr. Rayann Heman and was started on Flecainide. Carotid artery dopplers 11/11/17 with 50-69% narrowing bilateral ICA.  He was admitted to [facility] on 01/26/20 with slurred speech, swallowing trouble. He thought he was having a stroke. Head CT with no bleeding. Brain MRI with no evidence of stroke per pt. Records from [facility]: EKG 01/26/20 with NSR. CT head 01/26/20; no acute bleeding or infarct. Echo 01/26/20; LVEF=55-60%. Trace AI, MR, TR. Carotid artery dopplers 01/26/20: RICA 09-73% stenosis, LICA 53% stenosis. Creatinine 2.0  Troponin negative x 2. He has refused to take coumadin and does not wish to try any of the DOACs again.  ?  ?He is here today for follow up. The patient denies any chest pain, dyspnea, palpitations, lower extremity edema, orthopnea, PND, dizziness, near syncope or  syncope.  ? ?  ?Primary Care Physician: Lowella Dandy, NP ? ?Past Medical History:  ?Diagnosis Date  ? Allergy   ? Arthritis   ? psoriatic arthritis   ? Asthma   ? 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 Atheroembolism   ? CAD (coronary artery disease)   ? hx non obst  ? Cancer ([place])   ? basal cell x3 , last one removed - 12/12/2013, R temple area of forehead  ? Chronic kidney disease   ? renal stones - tx. /w lithotripsy  ? Complication of anesthesia   ? GERD (gastroesophageal reflux disease)   ? HLD (hyperlipidemia)   ? HTN (hypertension)   ? Hypoglycemia   ? Paroxysmal atrial fibrillation (HCC)   ? PONV (postoperative nausea and vomiting)   ? Pruritus   ? Psoriasis   ? Shortness of breath   ? after eating , if he gets active, he might have some SOB  ? Vitamin D deficiency   ? ? ?Past Surgical History:  ?Procedure Laterality Date  ? AV FISTULA PLACEMENT  07-2009  ? Left Brachiocephalic AVF  ? CARDIAC CATHETERIZATION  2010  ? COLONOSCOPY  09/15/2012  ? Colonic polyp status post polypectomy. Mild sigmoid diverticulosis. Small internal hemorrhoids. Otherwise normal colonoscopy   ? LIGATION OF ARTERIOVENOUS  FISTULA Left 12/30/2013  ? Procedure: EXCISION OF LEFT ARM ARTERIOVENOUS  FISTULA;  Surgeon: Rosetta Posner, MD;  Location: [facility];  Service: Vascular;  Laterality: Left;  ? LITHOTRIPSY    ? skin cancer removed    ? multiple basal cell  ? ? ?Current Outpatient Medications  ?Medication Sig Dispense Refill  ? amLODipine (NORVASC) 10 MG tablet Take 10 mg by mouth daily with breakfast.    ? betamethasone dipropionate (DIPROLENE) 0.05 % cream Apply 1 application topically 2 (two) times daily as needed (for psoriasis).    ? Evolocumab (REPATHA) 140 MG/ML SOSY Inject 140 mg into the skin every 21 ( twenty-one) days.    ? flecainide (TAMBOCOR) 50 MG tablet Take 50 mg by mouth in the morning and at bedtime.    ? labetalol (NORMODYNE) 200 MG tablet Take 300 mg by mouth once.     ? loratadine (CLARITIN) 10 MG tablet Take 10 mg by  mouth daily.    ? Omega-3 Fatty Acids (FISH OIL) 1000 MG CAPS Take 1 capsule by mouth daily.    ? OVER THE COUNTER MEDICATION Calcium with Vitamin D 3 plus minerals. One tablet everyday.    ? OVER THE COUNTER MEDICATION Co Q 10, 100 mg daily.    ? pravastatin (PRAVACHOL) 40 MG tablet Take 40 mg by mouth daily.     ? Triamcinolone Acetonide (KENALOG IJ) Inject 1 application as directed as directed. 3-4 months    ? AMBULATORY NON FORMULARY MEDICATION once a week. Medication Name: Senolytic activator by life extention (Patient not taking: Reported on 08/21/2021)    ? ?No current facility-administered medications for this visit.  ? ? ?Allergies  ?Allergen Reactions  ? Hydrocodone Nausea Only  ? Oxycodone Nausea And Vomiting  ? Eliquis [Apixaban]   ?  Itching and rash  ? Hydrochlorothiazide   ?  anxious  ? Hytrin [Terazosin Hcl]   ?  Heart races  ? Pradaxa [Dabigatran Etexilate Mesylate]   ?  Itching and rash   ? Sulfa Antibiotics Itching  ? Xarelto [Rivaroxaban]   ?  Itching and rash   ? ? ?Social History  ? ?Socioeconomic History  ? Marital status: Married  ?  Spouse name: Not on file  ? Number of children: 0  ? Years of education: Not on file  ? Highest education level: Not on file  ?Occupational History  ? Occupation: Retired from a Engineer, materials  ?Tobacco Use  ? Smoking status: Former  ?  Types: Cigarettes  ?  Quit date: 05/21/1970  ?  Years since quitting: 51.2  ? Smokeless tobacco: Never  ? Tobacco comments:  ?  quit 40+ years ago   ?Vaping Use  ? Vaping Use: Never used  ?Substance and Sexual Activity  ? Alcohol use: No  ? Drug use: No  ? Sexual activity: Not on file  ?Other Topics Concern  ? Not on file  ?Social History Narrative  ? Married, lives with wife; Loss adjuster, chartered.   Lives in [place]  ? ?Social Determinants of Health  ? ?Financial Resource Strain: Not on file  ?Food Insecurity: Not on file  ?Transportation Needs: Not on file  ?Physical Activity: Not on file  ?Stress: Not on file  ?Social  Connections: Not on file  ?Intimate Partner Violence: Not on file  ? ? ?Family History  ?Problem Relation Age of Onset  ? Cancer Mother   ? Heart disease Father   ?     Before age 30  ? Asthma Father   ? Heart disease Brother   ?     After 80 years of age  ? Coronary  artery disease Other   ?     family hx  ? Colon cancer Neg Hx   ? Esophageal cancer Neg Hx   ? Liver cancer Neg Hx   ? Pancreatic cancer Neg Hx   ? Rectal cancer Neg Hx   ? Stomach cancer Neg Hx   ? ? ?Review of Systems:  As stated in the HPI and otherwise negative.  ? ?BP 132/70   Pulse (!) 56   Ht '5\' 10"'$  (1.778 m)   Wt 162 lb 9.6 oz (73.8 kg)   SpO2 97%   BMI 23.33 kg/m?  ? ?Physical Examination: ? ?General: Well developed, well nourished, NAD  ?HEENT: OP clear, mucus membranes moist  ?SKIN: warm, dry. No rashes. ?Neuro: No focal deficits  ?Musculoskeletal: Muscle strength 5/5 all ext  ?Psychiatric: Mood and affect normal  ?Neck: No JVD, no carotid bruits, no thyromegaly, no lymphadenopathy.  ?Lungs:Clear bilaterally, no wheezes, rhonci, crackles ?Cardiovascular: Regular rate and rhythm. No murmurs, gallops or rubs. ?Abdomen:Soft. Bowel sounds present. Non-tender.  ?Extremities: No lower extremity edema. Pulses are 2 + in the bilateral DP/PT. ? ? ?Echo 02/28/18: ?Left ventricle: The cavity size was normal. Wall thickness was ?  increased in a pattern of mild LVH. There was mild focal basal ?  hypertrophy of the septum. Systolic function was normal. The ?  estimated ejection fraction was in the range of 55% to 60%. Wall ?  motion was normal; there were no regional wall motion ?  abnormalities. Doppler parameters are consistent with abnormal ?  left ventricular relaxation (grade 1 diastolic dysfunction). ?- Aortic valve: There was trivial regurgitation. ?- Aortic root: The aortic root was mildly dilated. ?- Mitral valve: Calcified annulus. ?  ?Impressions: ?  ?- Normal LV systolic function; mild LVH; mild diastolic ?  dysfunction; trace AI; mildly  dilated aortic root. ? ?EKG:  EKG is ordered today. ?The ekg ordered today demonstrates Sinus bradycardia ? ?Lipids followed in primary care ? ?Recent Labs: ?No results found for requested labs within last 8760 hours

## 2021-08-28 ENCOUNTER — Ambulatory Visit (HOSPITAL_COMMUNITY)
Admission: RE | Admit: 2021-08-28 | Discharge: 2021-08-28 | Disposition: A | Discharge status: Home / Self Care | Payer: Medicare Other | Source: Ambulatory Visit | Attending: Cardiology | Admitting: Cardiology

## 2021-08-28 DIAGNOSIS — I480 Paroxysmal atrial fibrillation: Secondary | ICD-10-CM | POA: Insufficient documentation

## 2021-08-28 DIAGNOSIS — I2510 Atherosclerotic heart disease of native coronary artery without angina pectoris: Secondary | ICD-10-CM | POA: Insufficient documentation

## 2021-08-28 DIAGNOSIS — I6523 Occlusion and stenosis of bilateral carotid arteries: Secondary | ICD-10-CM | POA: Diagnosis not present

## 2021-08-28 DIAGNOSIS — I10 Essential (primary) hypertension: Secondary | ICD-10-CM | POA: Diagnosis not present

## 2021-09-01 ENCOUNTER — Telehealth: Payer: Self-pay | Admitting: Cardiovascular Disease

## 2021-09-01 NOTE — Telephone Encounter (Signed)
Informed patient that when reviewed by Dr. Angelena Form we will give him a call with results. ?

## 2021-09-01 NOTE — Telephone Encounter (Signed)
Patient's wife is requesting to discuss carotid results. ?

## 2021-09-07 DIAGNOSIS — D539 Nutritional anemia, unspecified: Secondary | ICD-10-CM | POA: Diagnosis not present

## 2021-09-07 DIAGNOSIS — E785 Hyperlipidemia, unspecified: Secondary | ICD-10-CM | POA: Diagnosis not present

## 2021-09-07 DIAGNOSIS — I10 Essential (primary) hypertension: Secondary | ICD-10-CM | POA: Diagnosis not present

## 2021-09-07 DIAGNOSIS — N184 Chronic kidney disease, stage 4 (severe): Secondary | ICD-10-CM | POA: Diagnosis not present

## 2021-09-27 DIAGNOSIS — N184 Chronic kidney disease, stage 4 (severe): Secondary | ICD-10-CM | POA: Diagnosis not present

## 2021-10-06 DIAGNOSIS — N1832 Chronic kidney disease, stage 3b: Secondary | ICD-10-CM | POA: Diagnosis not present

## 2021-10-06 DIAGNOSIS — I129 Hypertensive chronic kidney disease with stage 1 through stage 4 chronic kidney disease, or unspecified chronic kidney disease: Secondary | ICD-10-CM | POA: Diagnosis not present

## 2021-10-06 DIAGNOSIS — N2581 Secondary hyperparathyroidism of renal origin: Secondary | ICD-10-CM | POA: Diagnosis not present

## 2021-10-09 ENCOUNTER — Other Ambulatory Visit (HOSPITAL_COMMUNITY): Payer: Self-pay | Admitting: Cardiovascular Disease

## 2021-10-09 DIAGNOSIS — I6521 Occlusion and stenosis of right carotid artery: Secondary | ICD-10-CM

## 2021-11-23 DIAGNOSIS — D225 Melanocytic nevi of trunk: Secondary | ICD-10-CM | POA: Diagnosis not present

## 2021-11-23 DIAGNOSIS — L570 Actinic keratosis: Secondary | ICD-10-CM | POA: Diagnosis not present

## 2021-11-23 DIAGNOSIS — D2239 Melanocytic nevi of other parts of face: Secondary | ICD-10-CM | POA: Diagnosis not present

## 2021-11-23 DIAGNOSIS — L820 Inflamed seborrheic keratosis: Secondary | ICD-10-CM | POA: Diagnosis not present

## 2021-12-07 DIAGNOSIS — E785 Hyperlipidemia, unspecified: Secondary | ICD-10-CM | POA: Diagnosis not present

## 2021-12-07 DIAGNOSIS — D539 Nutritional anemia, unspecified: Secondary | ICD-10-CM | POA: Diagnosis not present

## 2021-12-07 DIAGNOSIS — I10 Essential (primary) hypertension: Secondary | ICD-10-CM | POA: Diagnosis not present

## 2021-12-07 DIAGNOSIS — N184 Chronic kidney disease, stage 4 (severe): Secondary | ICD-10-CM | POA: Diagnosis not present

## 2021-12-20 DIAGNOSIS — R972 Elevated prostate specific antigen [PSA]: Secondary | ICD-10-CM | POA: Diagnosis not present

## 2022-03-07 DIAGNOSIS — L570 Actinic keratosis: Secondary | ICD-10-CM | POA: Diagnosis not present

## 2022-03-07 DIAGNOSIS — L820 Inflamed seborrheic keratosis: Secondary | ICD-10-CM | POA: Diagnosis not present

## 2022-03-09 DIAGNOSIS — N184 Chronic kidney disease, stage 4 (severe): Secondary | ICD-10-CM | POA: Diagnosis not present

## 2022-03-09 DIAGNOSIS — E785 Hyperlipidemia, unspecified: Secondary | ICD-10-CM | POA: Diagnosis not present

## 2022-03-09 DIAGNOSIS — D539 Nutritional anemia, unspecified: Secondary | ICD-10-CM | POA: Diagnosis not present

## 2022-03-09 DIAGNOSIS — I10 Essential (primary) hypertension: Secondary | ICD-10-CM | POA: Diagnosis not present

## 2022-06-14 DIAGNOSIS — E785 Hyperlipidemia, unspecified: Secondary | ICD-10-CM | POA: Diagnosis not present

## 2022-06-14 DIAGNOSIS — I10 Essential (primary) hypertension: Secondary | ICD-10-CM | POA: Diagnosis not present

## 2022-06-14 DIAGNOSIS — N184 Chronic kidney disease, stage 4 (severe): Secondary | ICD-10-CM | POA: Diagnosis not present

## 2022-06-14 DIAGNOSIS — J309 Allergic rhinitis, unspecified: Secondary | ICD-10-CM | POA: Diagnosis not present

## 2022-06-14 DIAGNOSIS — R7309 Other abnormal glucose: Secondary | ICD-10-CM | POA: Diagnosis not present

## 2022-06-14 DIAGNOSIS — D539 Nutritional anemia, unspecified: Secondary | ICD-10-CM | POA: Diagnosis not present

## 2022-06-14 DIAGNOSIS — R972 Elevated prostate specific antigen [PSA]: Secondary | ICD-10-CM | POA: Diagnosis not present

## 2022-06-26 DIAGNOSIS — Z008 Encounter for other general examination: Secondary | ICD-10-CM | POA: Diagnosis not present

## 2022-06-26 DIAGNOSIS — Z0000 Encounter for general adult medical examination without abnormal findings: Secondary | ICD-10-CM | POA: Diagnosis not present

## 2022-06-26 DIAGNOSIS — E785 Hyperlipidemia, unspecified: Secondary | ICD-10-CM | POA: Diagnosis not present

## 2022-07-05 ENCOUNTER — Ambulatory Visit: Payer: Medicare Other | Attending: Cardiovascular Disease | Admitting: Cardiovascular Disease

## 2022-07-05 ENCOUNTER — Encounter: Payer: Self-pay | Admitting: Cardiovascular Disease

## 2022-07-05 VITALS — BP 132/72 | HR 59 | Ht 70.0 in | Wt 156.4 lb

## 2022-07-05 DIAGNOSIS — I10 Essential (primary) hypertension: Secondary | ICD-10-CM | POA: Diagnosis not present

## 2022-07-05 DIAGNOSIS — I6523 Occlusion and stenosis of bilateral carotid arteries: Secondary | ICD-10-CM

## 2022-07-05 DIAGNOSIS — I480 Paroxysmal atrial fibrillation: Secondary | ICD-10-CM

## 2022-07-05 DIAGNOSIS — I2510 Atherosclerotic heart disease of native coronary artery without angina pectoris: Secondary | ICD-10-CM | POA: Diagnosis not present

## 2022-07-05 NOTE — Progress Notes (Signed)
Chief Complaint  Patient presents with   Follow-up    CAD, atrial fibrillation   History of Present Illness:  77 yo male with history of carotid artery disease, chronic kidney disease, CAD, paroxysmal atrial fibrillation, HTN and hyperlipidemia here today for follow up. I saw him as a new patient 02/20/18 for evaluation of atrial fibrillation. He has been followed in the past by Dr. Johnsie [name]. He had a cardiac cath in 2010 with Dr. Olevia [name] and was found to have mild CAD. He was suspected to have atheroemboli during the cath and had worsened renal insufficiency post cath. He had not been on coumadin. Echo in 2012 showed normal LV systolic function. There was grade 1 diastolic dysfunction. He had an AV fistula placed back in 2011 and this was removed in 2015 as he never progressed to ESRD. He woke up on 02/10/18 and felt weak and his heart was racing. He went in to see Jack Peace, NP and was found to have atrial fibrillation with HR 100 bpm. He did not tolerate Eliquis, Pradaxa or Xarelto due to a rash. I arranged an echo on 02/28/18 which showed normal LV systolic function, JXBJ=47-82%. Cardiac monitor with atrial fib. He was seen in EP clinic by Dr. Rayann [name] and was started on Flecainide. Carotid artery dopplers 11/11/17 with 50-69% narrowing bilateral ICA.  He was admitted to [facility] on 01/26/20 with slurred speech, swallowing trouble. He thought he was having a stroke. Head CT with no bleeding. Brain MRI with no evidence of stroke per pt. Records from [facility]: EKG 01/26/20 with NSR. CT head 01/26/20; no acute bleeding or infarct. Echo 01/26/20; LVEF=55-60%. Trace AI, MR, TR. Carotid artery dopplers April 2023 with moderate RICA stenosis and mild LICA stensosis. He has refused to take coumadin and does not wish to try any of the DOACs again. He has had several episodes of atrial fib in December and one in January. He takes an extra Flecainide and the episodes resolve after an hour or two.   He is here  today for follow up. The patient denies any chest pain, dyspnea, palpitations, lower extremity edema, orthopnea, PND, dizziness, near syncope or syncope.    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 /w lithotripsy   Complication of anesthesia    GERD (gastroesophageal reflux disease)    HLD (hyperlipidemia)    HTN (hypertension)    Hypoglycemia    Paroxysmal atrial fibrillation (HCC)    PONV (postoperative nausea and vomiting)    Pruritus    Psoriasis    Shortness of breath    after eating , if he gets active, he might have some SOB   Vitamin D deficiency     Past Surgical History:  Procedure Laterality Date   AV FISTULA PLACEMENT  07-2009   Left Brachiocephalic AVF   CARDIAC CATHETERIZATION  2010   COLONOSCOPY  09/15/2012   Colonic polyp status post polypectomy. Mild sigmoid diverticulosis. Small internal hemorrhoids. Otherwise normal colonoscopy    LIGATION OF ARTERIOVENOUS  FISTULA Left 12/30/2013  Procedure: EXCISION OF LEFT ARM ARTERIOVENOUS  FISTULA;  Surgeon: Rosetta Posner, MD;  Location: [facility] OR;  Service: Vascular;  Laterality: Left;   LITHOTRIPSY     skin cancer removed     multiple basal cell    Current Outpatient Medications  Medication Sig Dispense Refill   AMBULATORY NON FORMULARY MEDICATION once a week. Medication Name: Senolytic activator by life extention     amLODipine (NORVASC) 10 MG tablet Take 10 mg by mouth daily with breakfast.     betamethasone dipropionate (DIPROLENE) 0.05 % cream Apply 1 application topically 2 (two) times daily as needed (for psoriasis).     cetirizine (ZYRTEC) 10 MG tablet Take 10 mg by mouth  daily.     Evolocumab (REPATHA) 140 MG/ML SOSY Inject 140 mg into the skin every 21 ( twenty-one) days.     flecainide (TAMBOCOR) 50 MG tablet Take 50 mg by mouth in the morning and at bedtime.     labetalol (NORMODYNE) 200 MG tablet Take 300 mg by mouth once.      Omega-3 Fatty Acids (FISH OIL) 1000 MG CAPS Take 1 capsule by mouth daily.     OVER THE COUNTER MEDICATION Calcium with Vitamin D 3 plus minerals. One tablet everyday.     OVER THE COUNTER MEDICATION Co Q 10, 100 mg daily.     pravastatin (PRAVACHOL) 40 MG tablet Take 40 mg by mouth daily.      Triamcinolone Acetonide (KENALOG IJ) Inject 1 application as directed as directed. 3-4 months     loratadine (CLARITIN) 10 MG tablet Take 10 mg by mouth daily. (Patient not taking: Reported on 07/05/2022)     No current facility-administered medications for this visit.    Allergies  Allergen Reactions   Hydrocodone Nausea Only   Oxycodone Nausea And Vomiting   Eliquis [Apixaban]     Itching and rash   Hydrochlorothiazide     anxious   Hytrin [Terazosin Hcl]     Heart races   Pradaxa [Dabigatran Etexilate Mesylate]     Itching and rash    Sulfa Antibiotics Itching   Xarelto [Rivaroxaban]     Itching and rash     Social History   Socioeconomic History   Marital status: Married    Spouse name: Not on file   Number of children: 0   Years of education: Not on file   Highest education level: Not on file  Occupational History   Occupation: Retired from a Engineer, materials  Tobacco Use   Smoking status: Former    Types: Cigarettes    Quit date: 05/21/1970    Years since quitting: 52.1   Smokeless tobacco: Never   Tobacco comments:    quit 40+ years ago   Vaping Use   Vaping Use: Never used  Substance and Sexual Activity   Alcohol use: No   Drug use: No   Sexual activity: Not on file  Other Topics Concern   Not on file  Social History Narrative   Married, lives with wife; Loss adjuster, chartered.   L[facility] in [facility]    Social Determinants of Health   Financial Resource Strain: Not on file  Food Insecurity: Not on file  Transportation Needs: Not on file  Physical Activity: Not on file  Stress: Not on file  Social Connections: Not on file  Intimate Partner Violence: Not on file    Family History  Problem Relation Age of Onset   Cancer Mother    Heart  disease Father        Before age 52   Asthma Father    Heart disease Brother        After 40 years of age   Coronary artery disease Other        family hx   Colon cancer Neg Hx    Esophageal cancer Neg Hx    Liver cancer Neg Hx    Pancreatic cancer Neg Hx    Rectal cancer Neg Hx    Stomach cancer Neg Hx     Review of Systems:  As stated in the HPI and otherwise negative.   BP 132/72 (BP Location: Right Arm, Patient Position: Sitting, Cuff Size: Normal)   Pulse (!) 59   Ht '5\' 10"'$  (1.778 m)   Wt 70.9 kg   SpO2 97%   BMI 22.44 kg/m   Physical Examination:  General: Well developed, well nourished, NAD  HEENT: OP clear, mucus membranes moist  SKIN: warm, dry. No rashes. Neuro: No focal deficits  Musculoskeletal: Muscle strength 5/5 all ext  Psychiatric: Mood and affect normal  Neck: No JVD, no carotid bruits, no thyromegaly, no lymphadenopathy.  Lungs:Clear bilaterally, no wheezes, rhonci, crackles Cardiovascular: Regular rate and rhythm. No murmurs, gallops or rubs. Abdomen:Soft. Bowel sounds present. Non-tender.  Extremities: No lower extremity edema. Pulses are 2 + in the bilateral DP/PT.  Echo 02/28/18: Left ventricle: The cavity size was normal. Wall thickness was   increased in a pattern of mild LVH. There was mild focal basal   hypertrophy of the septum. Systolic function was normal. The   estimated ejection fraction was in the range of 55% to 60%. Wall   motion was normal; there were no regional wall motion   abnormalities. Doppler parameters are consistent with abnormal   left ventricular relaxation (grade 1 diastolic  dysfunction). - Aortic valve: There was trivial regurgitation. - Aortic root: The aortic root was mildly dilated. - Mitral valve: Calcified annulus.   Impressions:   - Normal LV systolic function; mild LVH; mild diastolic   dysfunction; trace AI; mildly dilated aortic root.  EKG:  EKG is ordered today. The ekg ordered today demonstrates Sinus bradycardia, rate 59 bpm  Lipids followed in primary care  Recent Labs: No results found for requested labs within last 365 days.   Lipid Panel  Wt Readings from Last 3 Encounters:  07/05/22 70.9 kg  08/21/21 73.8 kg  07/26/20 71.3 kg    Assessment and Plan:   1. Paroxysmal atrial fibrillation: Sinus on exam today. Rare episodes of atrial fibrillation. He does not tolerate Xarelto, Eliquis or Pradaxa due to a rash. I have reviewed indication for coumadin again today. He does not want to start anti-coagulation. I have reviewed his CHADS VASC score of 3 which he understands indicated a 3.2 % risk per year of a stroke. Continue ASA and Flecainide    2. CAD without angina: No chest pain. Continue statin, Repatha and beta blocker. LDL 40 in January 2024.    3. Carotid artery disease:  Carotid artery dopplers April 2023 with moderate RICA stenosis and mild LICA stensosis. Repeat carotid dopplers in April 2024.    4. HTN: BP is controlled. No changes today  Labs/ tests ordered today include:   Orders Placed This Encounter  Procedures   EKG 12-Lead   VAS US CAROTID   Disposition:   F/U with me in 12 months.   Signed, Lauree Chandler, MD 07/05/2022 2:47 PM    [facility] [address], [place], [place]  [address] Phone: [phone]; Fax: [phone]

## 2022-07-05 NOTE — Patient Instructions (Signed)
Medication Instructions:  No changes *If you need a refill on your cardiac medications before your next appointment, please call your pharmacy*   Lab Work: none   Testing/Procedures: Your physician has requested that you have a carotid duplex. This test is an ultrasound of the carotid arteries in your neck. It looks at blood flow through these arteries that supply the brain with blood. Allow one hour for this exam. There are no restrictions or special instructions.   Follow-Up: At [facility], you and your health needs are our priority.  As part of our continuing mission to provide you with exceptional heart care, we have created designated Provider Care Teams.  These Care Teams include your primary Cardiologist (physician) and Advanced Practice Providers (APPs -  Physician Assistants and Nurse Practitioners) who all work together to provide you with the care you need, when you need it.  We recommend signing up for the patient portal called "MyChart".  Sign up information is provided on this After Visit Summary.  MyChart is used to connect with patients for Virtual Visits (Telemedicine).  Patients are able to view lab/test results, encounter notes, upcoming appointments, etc.  Non-urgent messages can be sent to your provider as well.   To learn more about what you can do with MyChart, go to [email or web address].    Your next appointment:   12 month(s)  Provider:   Lauree Chandler, MD

## 2022-07-13 DIAGNOSIS — I77811 Abdominal aortic ectasia: Secondary | ICD-10-CM | POA: Diagnosis not present

## 2022-07-13 DIAGNOSIS — Z87891 Personal history of nicotine dependence: Secondary | ICD-10-CM | POA: Diagnosis not present

## 2022-07-13 DIAGNOSIS — Z136 Encounter for screening for cardiovascular disorders: Secondary | ICD-10-CM | POA: Diagnosis not present

## 2022-08-16 DIAGNOSIS — D2239 Melanocytic nevi of other parts of face: Secondary | ICD-10-CM | POA: Diagnosis not present

## 2022-08-16 DIAGNOSIS — D485 Neoplasm of uncertain behavior of skin: Secondary | ICD-10-CM | POA: Diagnosis not present

## 2022-08-16 DIAGNOSIS — L814 Other melanin hyperpigmentation: Secondary | ICD-10-CM | POA: Diagnosis not present

## 2022-08-20 ENCOUNTER — Ambulatory Visit: Payer: Medicare Other | Attending: Cardiology

## 2022-08-20 DIAGNOSIS — I6523 Occlusion and stenosis of bilateral carotid arteries: Secondary | ICD-10-CM | POA: Diagnosis not present

## 2022-09-13 DIAGNOSIS — J309 Allergic rhinitis, unspecified: Secondary | ICD-10-CM | POA: Diagnosis not present

## 2022-09-13 DIAGNOSIS — I10 Essential (primary) hypertension: Secondary | ICD-10-CM | POA: Diagnosis not present

## 2022-09-13 DIAGNOSIS — E785 Hyperlipidemia, unspecified: Secondary | ICD-10-CM | POA: Diagnosis not present

## 2022-09-13 DIAGNOSIS — D539 Nutritional anemia, unspecified: Secondary | ICD-10-CM | POA: Diagnosis not present

## 2022-09-13 DIAGNOSIS — R972 Elevated prostate specific antigen [PSA]: Secondary | ICD-10-CM | POA: Diagnosis not present

## 2022-09-13 DIAGNOSIS — N184 Chronic kidney disease, stage 4 (severe): Secondary | ICD-10-CM | POA: Diagnosis not present

## 2022-09-13 DIAGNOSIS — R7309 Other abnormal glucose: Secondary | ICD-10-CM | POA: Diagnosis not present

## 2022-09-14 DIAGNOSIS — N1832 Chronic kidney disease, stage 3b: Secondary | ICD-10-CM | POA: Diagnosis not present

## 2022-09-17 DIAGNOSIS — C4441 Basal cell carcinoma of skin of scalp and neck: Secondary | ICD-10-CM | POA: Diagnosis not present

## 2022-09-20 DIAGNOSIS — N2581 Secondary hyperparathyroidism of renal origin: Secondary | ICD-10-CM | POA: Diagnosis not present

## 2022-09-20 DIAGNOSIS — N1832 Chronic kidney disease, stage 3b: Secondary | ICD-10-CM | POA: Diagnosis not present

## 2022-09-20 DIAGNOSIS — I129 Hypertensive chronic kidney disease with stage 1 through stage 4 chronic kidney disease, or unspecified chronic kidney disease: Secondary | ICD-10-CM | POA: Diagnosis not present

## 2022-10-01 DIAGNOSIS — C44519 Basal cell carcinoma of skin of other part of trunk: Secondary | ICD-10-CM | POA: Diagnosis not present

## 2022-10-23 ENCOUNTER — Telehealth: Payer: Self-pay | Admitting: Cardiovascular Disease

## 2022-10-23 NOTE — Telephone Encounter (Signed)
Called patient's wife (DPR). Patient has been having chest tightness for over a month that comes and goes. Patient stated it gets worse at night when he is lying down. Patient stated he is unable to lay on his left side. Patient has a history of PAF and CAD. Patient wanted to see Dr. Clifton James, but there are no appointment for several months. Informed patient that he needs to be evaluated sooner than later and offered him an appointment with DOD tomorrow. Informed patient that a message would be sent to Dr. Clifton James for further advisement. Encouraged patient to go to ED if his chest tightness came back and/or his symptoms get worse. Patient agreed to plan.

## 2022-10-23 NOTE — Telephone Encounter (Signed)
Pt c/o of Chest Pain: STAT if CP now or developed within 24 hours  1. Are you having CP right now? No; CP last night  2. Are you experiencing any other symptoms (ex. SOB, nausea, vomiting, sweating)? SOB, fatigue, weak  3. How long have you been experiencing CP? Around a month   4. Is your CP continuous or coming and going? continuous  5. Have you taken Nitroglycerin? No  ?

## 2022-10-24 ENCOUNTER — Encounter: Payer: Self-pay | Admitting: Cardiology

## 2022-10-24 ENCOUNTER — Ambulatory Visit: Payer: Medicare Other | Attending: Cardiology | Admitting: Cardiology

## 2022-10-24 VITALS — BP 110/74 | HR 65 | Ht 70.0 in | Wt 157.0 lb

## 2022-10-24 DIAGNOSIS — R079 Chest pain, unspecified: Secondary | ICD-10-CM

## 2022-10-24 NOTE — Patient Instructions (Addendum)
Medication Instructions:  Your physician recommends that you continue on your current medications as directed. Please refer to the Current Medication list given to you today.  *If you need a refill on your cardiac medications before your next appointment, please call your pharmacy*   Lab Work: NONE If you have labs (blood work) drawn today and your tests are completely normal, you will receive your results only by: MyChart Message (if you have MyChart) OR A paper copy in the mail If you have any lab test that is abnormal or we need to change your treatment, we will call you to review the results.   Testing/Procedures: Your physician has requested that you have a lexiscan myoview. For further information please visit [email or web address] Please follow instruction sheet, as given.  Follow-Up: At [facility], you and your health needs are our priority.  As part of our continuing mission to provide you with exceptional heart care, we have created designated Provider Care Teams.  These Care Teams include your primary Cardiologist (physician) and Advanced Practice Providers (APPs -  Physician Assistants and Nurse Practitioners) who all work together to provide you with the care you need, when you need it.  Your next appointment:   Will determine based on testing  Provider:   Verne Carrow, MD   Other Instructions Lexiscan Stress Test Instructions  Please arrive 15 minutes prior to your appointment time for registration and insurance purposes.  The test will take approximately 3 to 4 hours to complete; you may bring reading material.  If someone comes with you to your appointment, they will need to remain in the main lobby due to limited space in the testing area. **If you are pregnant or breastfeeding, please notify the nuclear lab prior to your appointment**  How to prepare for your Myocardial Perfusion Test: Do not eat or drink 3 hours prior to your test, except you may  have water. Do not consume products containing caffeine (regular or decaffeinated) 12 hours prior to your test. (ex: coffee, chocolate, sodas, tea). Do bring a list of your current medications with you.  If not listed below, you may take your medications as normal. Do wear comfortable clothes (no dresses or overalls) and walking shoes, tennis shoes preferred (No heels or open toe shoes are allowed). Do NOT wear cologne, perfume, aftershave, or lotions (deodorant is allowed). If these instructions are not followed, your test will have to be rescheduled.  Please report to [address] for your test.  If you have questions or concerns about your appointment, you can call the Nuclear Lab at [phone].  If you cannot keep your appointment, please provide 24 hours notification to the Nuclear Lab, to avoid a possible $50 charge to your account.

## 2022-10-24 NOTE — Progress Notes (Signed)
Cardiology Office Note:    Date:  10/24/2022   ID:  Jack [name], Jack [name] 27-Oct-1945, MRN [id number]  PCP:  Hurshel Party, NP   [facility] Providers Cardiologist:  Verne Carrow, MD     Referring MD: Hurshel Party, NP    History of Present Illness:    Jack [name] is a 77 y.o. male here for the evaluation of chest pain that has been off and on over the past month.  Has hypertension hyperlipidemia.  He had a cardiac catheterization performed in 2010.  Has flecainide for paroxysmal atrial fibrillation.  Mild CAD during that cardiac cath in 2010, post procedure had atheroembolic showering which caused acute kidney injury.  Has AV fistula but this was removed because he never progressed to ESRD.  Last creatinine 1.8.  He has noted some chest burning like sensation when pulling his trash can up the driveway.  He has noted it at times when getting up to go to the bathroom but if he lays on his right side it seems to dissipate when he comes back in bed.  Some exertional component.  Past Medical History:  Diagnosis Date   Allergy    Arthritis    psoriatic arthritis    Asthma    Atheroembolic kidney disease (HCC)    Atheroembolism    CAD (coronary artery disease)    hx non obst   Cancer (HCC)    basal cell x3 , last one removed - 12/12/2013, R temple area of forehead   Chronic kidney disease    renal stones - tx. /w lithotripsy   Complication of anesthesia    GERD (gastroesophageal reflux disease)    HLD (hyperlipidemia)    HTN (hypertension)    Hypoglycemia    Paroxysmal atrial fibrillation (HCC)    PONV (postoperative nausea and vomiting)    Pruritus    Psoriasis    Shortness of breath    after eating , if he gets active, he might have some SOB   Vitamin D deficiency     Past Surgical History:  Procedure Laterality Date   AV FISTULA PLACEMENT  07-2009   Left Brachiocephalic AVF   CARDIAC CATHETERIZATION  2010   COLONOSCOPY  09/15/2012   Colonic polyp status post  polypectomy. Mild sigmoid diverticulosis. Small internal hemorrhoids. Otherwise normal colonoscopy    LIGATION OF ARTERIOVENOUS  FISTULA Left 12/30/2013   Procedure: EXCISION OF LEFT ARM ARTERIOVENOUS  FISTULA;  Surgeon: Larina Earthly, MD;  Location: [facility] OR;  Service: Vascular;  Laterality: Left;   LITHOTRIPSY     skin cancer removed     multiple basal cell    Current Medications: Current Meds  Medication Sig   amLODipine (NORVASC) 10 MG tablet Take 10 mg by mouth daily with breakfast.   ascorbic acid (VITAMIN C) 500 MG tablet Take 500 mg by mouth daily.   betamethasone dipropionate (DIPROLENE) 0.05 % cream Apply 1 application topically 2 (two) times daily as needed (for psoriasis).   cetirizine (ZYRTEC) 10 MG tablet Take 10 mg by mouth daily.   Evolocumab (REPATHA) 140 MG/ML SOSY Inject 140 mg into the skin every 21 ( twenty-one) days.   flecainide (TAMBOCOR) 50 MG tablet Take 50 mg by mouth in the morning and at bedtime.   labetalol (NORMODYNE) 200 MG tablet Take 300 mg by mouth once.    loratadine (CLARITIN) 10 MG tablet Take 10 mg by mouth daily.   Omega-3 Fatty Acids (FISH OIL) 1000 MG  CAPS Take 1 capsule by mouth daily.   OVER THE COUNTER MEDICATION Calcium with Vitamin D 3 plus minerals. One tablet everyday.   OVER THE COUNTER MEDICATION Co Q 10, 100 mg daily.   OVER THE COUNTER MEDICATION Take 1 capsule by mouth daily at 2 am. Broccoli with myrosinase ( Life Extension Brand )   OVER THE COUNTER MEDICATION Mens' Bladder Support ( [employer] Extension Brand) 1 capsule daily   pravastatin (PRAVACHOL) 40 MG tablet Take 40 mg by mouth daily.    Triamcinolone Acetonide (KENALOG IJ) Inject 1 application as directed as directed. 3-4 months     Allergies:   Hydrocodone, Oxycodone, Eliquis [apixaban], Hydrochlorothiazide, Hytrin [terazosin hcl], Pradaxa [dabigatran etexilate mesylate], Sulfa antibiotics, and Xarelto [rivaroxaban]   Social History   Socioeconomic History   Marital status:  Married    Spouse name: Not on file   Number of children: 0   Years of education: Not on file   Highest education level: Not on file  Occupational History   Occupation: Retired from a Education officer, environmental  Tobacco Use   Smoking status: Former    Types: Cigarettes    Quit date: 05/21/1970    Years since quitting: 52.4   Smokeless tobacco: Never   Tobacco comments:    quit 40+ years ago   Vaping Use   Vaping Use: Never used  Substance and Sexual Activity   Alcohol use: No   Drug use: No   Sexual activity: Not on file  Other Topics Concern   Not on file  Social History Narrative   Married, lives with wife; Designer, television/film set.   L[facility]   Social Determinants of Health   Financial Resource Strain: Not on file  Food Insecurity: Not on file  Transportation Needs: Not on file  Physical Activity: Not on file  Stress: Not on file  Social Connections: Not on file     Family History: The patient's family history includes Asthma in his father; Cancer in his mother; Coronary artery disease in an other family member; Heart disease in his brother and father. There is no history of Colon cancer, Esophageal cancer, Liver cancer, Pancreatic cancer, Rectal cancer, or Stomach cancer.  ROS:   Please see the history of present illness.     All other systems reviewed and are negative.  EKGs/Labs/Other Studies Reviewed:    The following studies were reviewed today: Cardiac Studies & Procedures     STRESS TESTS  MYOCARDIAL PERFUSION IMAGING 02/27/2018  Narrative  Nuclear stress EF: 63%.  Blood pressure demonstrated a hypertensive response to exercise.  Horizontal ST segment depression ST segment depression of 2 mm was noted during stress in the II, III, aVF, V5, V6 and V4 leads, and returning to baseline after 1-5 minutes of recovery.  No T wave inversion was noted during stress.  This is a low risk study.  The left ventricular ejection fraction is normal (55-65%).   EKG findings are concerning for ischemia. However, perfusion imaging is normal.   ECHOCARDIOGRAM  ECHOCARDIOGRAM COMPLETE 02/28/2018  Narrative *[facility]* [address] [place], [place] [address] [phone]  ------------------------------------------------------------------- Transthoracic Echocardiography  Patient:    Radin, Bukhari MR #:       [id number] Study Date: 02/28/2018 Gender:     M Age:        24 Height:     177.8 cm Weight:     69.9 kg BSA:        1.86  m^2 Pt. Status: Room:  SONOGRAPHER  Cathie Beams ATTENDING    Teresita Madura, Christopher REFERRING    McAlhany, Christopher PERFORMING   [facility], Outpatient  cc:  ------------------------------------------------------------------- LV EF: 55% -   60%  ------------------------------------------------------------------- Study Conclusions  - Left ventricle: The cavity size was normal. Wall thickness was increased in a pattern of mild LVH. There was mild focal basal hypertrophy of the septum. Systolic function was normal. The estimated ejection fraction was in the range of 55% to 60%. Wall motion was normal; there were no regional wall motion abnormalities. Doppler parameters are consistent with abnormal left ventricular relaxation (grade 1 diastolic dysfunction). - Aortic valve: There was trivial regurgitation. - Aortic root: The aortic root was mildly dilated. - Mitral valve: Calcified annulus.  Impressions:  - Normal LV systolic function; mild LVH; mild diastolic dysfunction; trace AI; mildly dilated aortic root.  ------------------------------------------------------------------- Study data:  Comparison was made to the study of 02/01/2011.  Study status:  Routine.  Procedure:  The patient reported no pain pre or post test. Transthoracic echocardiography. Image quality was adequate.  Study completion:  There were no complications. Transthoracic  echocardiography.  M-mode, complete 2D, spectral Doppler, and color Doppler.  Birthdate:  Patient birthdate: 1946-01-31.  Age:  Patient is 77 yr old.  Sex:  Gender: male. BMI: 22.1 kg/m^2.  Blood pressure:     122/88  Patient status: Outpatient.  Study date:  Study date: 02/28/2018. Study time: 08:30 AM.  Location:  [facility]  -------------------------------------------------------------------  ------------------------------------------------------------------- Left ventricle:  The cavity size was normal. Wall thickness was increased in a pattern of mild LVH. There was mild focal basal hypertrophy of the septum. Systolic function was normal. The estimated ejection fraction was in the range of 55% to 60%. Wall motion was normal; there were no regional wall motion abnormalities. Doppler parameters are consistent with abnormal left ventricular relaxation (grade 1 diastolic dysfunction).  ------------------------------------------------------------------- Aortic valve:   Trileaflet; mildly thickened leaflets. Mobility was not restricted.  Doppler:  Transvalvular velocity was within the normal range. There was no stenosis. There was trivial regurgitation.  ------------------------------------------------------------------- Aorta:  Aortic root: The aortic root was mildly dilated.  ------------------------------------------------------------------- Mitral valve:   Calcified annulus. Mobility was not restricted. Doppler:  Transvalvular velocity was within the normal range. There was no evidence for stenosis. There was no regurgitation.  ------------------------------------------------------------------- Left atrium:  The atrium was normal in size.  ------------------------------------------------------------------- Right ventricle:  The cavity size was normal. Systolic function was normal.  ------------------------------------------------------------------- Pulmonic valve:     Doppler:  Transvalvular velocity was within the normal range. There was no evidence for stenosis. There was mild regurgitation.  ------------------------------------------------------------------- Tricuspid valve:   Structurally normal valve.    Doppler: Transvalvular velocity was within the normal range. There was trivial regurgitation.  ------------------------------------------------------------------- Right atrium:  The atrium was normal in size.  ------------------------------------------------------------------- Pericardium:  There was no pericardial effusion.  ------------------------------------------------------------------- Systemic veins: Inferior vena cava: The vessel was normal in size.  ------------------------------------------------------------------- Measurements  Left ventricle                           Value        Reference LV ID, ED, PLAX chordal          (L)     38.7  mm     43 - 52 LV ID, ES, PLAX chordal          (  L)     21.3  mm     23 - 38 LV fx shortening, PLAX chordal           45    %      >=29 LV PW thickness, ED                      12.6  mm     ---------- IVS/LV PW ratio, ED                      1.06         <=1.3 LV e&', lateral                           7.07  cm/s   ---------- LV E/e&', lateral                         8.5          ---------- LV e&', medial                            4.79  cm/s   ---------- LV E/e&', medial                          12.55        ---------- LV e&', average                           5.93  cm/s   ---------- LV E/e&', average                         10.13        ----------  Ventricular septum                       Value        Reference IVS thickness, ED                        13.3  mm     ----------  LVOT                                     Value        Reference LVOT ID, S                               20    mm     ---------- LVOT area                                3.14  cm^2   ----------  Aortic valve                              Value        Reference Aortic regurg peak velocity              264   cm/s   ---------- Aortic regurg pressure half-time  496   ms     ---------- Aortic regurg peak gradient              28    mm Hg  ----------  Aorta                                    Value        Reference Aortic root ID, ED                       39    mm     ----------  Left atrium                              Value        Reference LA ID, A-P, ES                           19    mm     ---------- LA ID/bsa, A-P                           1.02  cm/m^2 <=2.2 LA volume, S                             53.7  ml     ---------- LA volume/bsa, S                         28.9  ml/m^2 ---------- LA volume, ES, 1-p A4C                   58.3  ml     ---------- LA volume/bsa, ES, 1-p A4C               31.4  ml/m^2 ---------- LA volume, ES, 1-p A2C                   49.7  ml     ---------- LA volume/bsa, ES, 1-p A2C               26.8  ml/m^2 ----------  Mitral valve                             Value        Reference Mitral E-wave peak velocity              60.1  cm/s   ---------- Mitral A-wave peak velocity              79.1  cm/s   ---------- Mitral deceleration time                 201   ms     150 - 230 Mitral E/A ratio, peak                   0.8          ----------  Right atrium                             Value        Reference RA ID, S-I,  ES, A4C              (H)     50    mm     34 - 49 RA area, ES, A4C                         15.3  cm^2   8.3 - 19.5 RA volume, ES, A/L                       38.3  ml     ---------- RA volume/bsa, ES, A/L                   20.6  ml/m^2 ----------  Right ventricle                          Value        Reference RV s&', lateral, S                        13.2  cm/s   ----------  Pulmonic valve                           Value        Reference Pulmonic regurg velocity, ED             110   cm/s   ----------  Legend: (L)  and  (H)  Hari Casaus values outside specified  reference range.  ------------------------------------------------------------------- Prepared and Electronically Authenticated by  Olga Millers 2019-10-11T12:49:46    MONITORS  CARDIAC EVENT MONITOR 04/04/2018  Narrative Sinus rhythm with sinus bradycardia. Lowest heart rate 50 bpm One 15 beat run of non-sustained ventricular tachycardia One episode of atrial fibrillation with rapid ventricular response. Discussed at follow up visit. Premature ventricular contractions Premature atrial contractions            EKG: 10/24/2022: sinus rhythm nonspecific ST-T wave changes 65 bpm  Recent Labs: No results found for requested labs within last 365 days.  Recent Lipid Panel    Component Value Date/Time   CHOL (H) 05/18/2009 0550    237        ATP III CLASSIFICATION:  <200     mg/dL   Desirable  161-096  mg/dL   Borderline High  >=045    mg/dL   High          TRIG 409 05/18/2009 0550   HDL 38 (L) 05/18/2009 0550   CHOLHDL 6.2 05/18/2009 0550   VLDL 26 05/18/2009 0550   LDLCALC (H) 05/18/2009 0550    173        Total Cholesterol/HDL:CHD Risk Coronary Heart Disease Risk Table                     Men   Women  1/2 Average Risk   3.4   3.3  Average Risk       5.0   4.4  2 X Average Risk   9.6   7.1  3 X Average Risk  23.4   11.0        Use the calculated Patient Ratio above and the CHD Risk Table to determine the patient's CHD Risk.        ATP III CLASSIFICATION (LDL):  <100     mg/dL   Optimal  811-914  mg/dL  Near or Above                    Optimal  130-159  mg/dL   Borderline  161-096  mg/dL   High  >045     mg/dL   Very High     Risk Assessment/Calculations:               Physical Exam:    VS:  BP 110/74   Pulse 65   Ht 5\' 10"  (1.778 m)   Wt 157 lb (71.2 kg)   SpO2 98%   BMI 22.53 kg/m     Wt Readings from Last 3 Encounters:  10/24/22 157 lb (71.2 kg)  07/05/22 156 lb 6.4 oz (70.9 kg)  08/21/21 162 lb 9.6 oz (73.8 kg)     GEN:  Well  nourished, well developed in no acute distress HEENT: Normal NECK: No JVD; No carotid bruits LYMPHATICS: No lymphadenopathy CARDIAC: RRR, no murmurs, rubs, gallops RESPIRATORY:  Clear to auscultation without rales, wheezing or rhonchi  ABDOMEN: Soft, non-tender, non-distended MUSCULOSKELETAL:  No edema; No deformity  SKIN: Warm and dry NEUROLOGIC:  Alert and oriented x 3 PSYCHIATRIC:  Normal affect   ASSESSMENT:    1. Chest pain of uncertain etiology    PLAN:    In order of problems listed above:  Anginal symptoms -Proceed with Myoview stress test, Lexiscan would be helpful for him since he has had a history of atrial fibrillation and currently takes flecainide and labetalol. -If high risk, we will need to consider cardiac catheterization.  Had lengthy discussion with him.  He was after his last heart catheterization in 2010 when he had complications with atheroemboli during the cath with worsened renal insufficiency.  He is highly concerned about proceeding in this direction.  I had offered him catheterization first.  I understand.  We will start with stress test.  If low risk, consider utilizing perhaps isosorbide. He will follow-up with Dr. Clifton James.  Obviously if symptoms worsen or become more worrisome, he may need further more invasive testing sooner.  CAD - Minimal disease noted in 2010.  He is on Repatha beta-blocker LDL 40.  Carotid artery disease - Moderate right mild left.           Medication Adjustments/Labs and Tests Ordered: Current medicines are reviewed at length with the patient today.  Concerns regarding medicines are outlined above.  Orders Placed This Encounter  Procedures   MYOCARDIAL PERFUSION IMAGING   EKG 12-Lead   No orders of the defined types were placed in this encounter.   Patient Instructions  Medication Instructions:  Your physician recommends that you continue on your current medications as directed. Please refer to the Current  Medication list given to you today.  *If you need a refill on your cardiac medications before your next appointment, please call your pharmacy*   Lab Work: NONE If you have labs (blood work) drawn today and your tests are completely normal, you will receive your results only by: MyChart Message (if you have MyChart) OR A paper copy in the mail If you have any lab test that is abnormal or we need to change your treatment, we will call you to review the results.   Testing/Procedures: Your physician has requested that you have a lexiscan myoview. For further information please visit [email or web address] Please follow instruction sheet, as given.  Follow-Up: At [facility], you and your health needs are our priority.  As part of our continuing  mission to provide you with exceptional heart care, we have created designated Provider Care Teams.  These Care Teams include your primary Cardiologist (physician) and Advanced Practice Providers (APPs -  Physician Assistants and Nurse Practitioners) who all work together to provide you with the care you need, when you need it.  Your next appointment:   Will determine based on testing  Provider:   Verne Carrow, MD   Other Instructions Lexiscan Stress Test Instructions  Please arrive 15 minutes prior to your appointment time for registration and insurance purposes.  The test will take approximately 3 to 4 hours to complete; you may bring reading material.  If someone comes with you to your appointment, they will need to remain in the main lobby due to limited space in the testing area. **If you are pregnant or breastfeeding, please notify the nuclear lab prior to your appointment**  How to prepare for your Myocardial Perfusion Test: Do not eat or drink 3 hours prior to your test, except you may have water. Do not consume products containing caffeine (regular or decaffeinated) 12 hours prior to your test. (ex: coffee, chocolate,  sodas, tea). Do bring a list of your current medications with you.  If not listed below, you may take your medications as normal. Do wear comfortable clothes (no dresses or overalls) and walking shoes, tennis shoes preferred (No heels or open toe shoes are allowed). Do NOT wear cologne, perfume, aftershave, or lotions (deodorant is allowed). If these instructions are not followed, your test will have to be rescheduled.  Please report to [address] for your test.  If you have questions or concerns about your appointment, you can call the Nuclear Lab at [phone].  If you cannot keep your appointment, please provide 24 hours notification to the Nuclear Lab, to avoid a possible $50 charge to your account.   Signed, Donato Schultz, MD  10/24/2022 5:06 PM    [facility]

## 2022-10-26 ENCOUNTER — Telehealth (HOSPITAL_COMMUNITY): Payer: Self-pay | Admitting: *Deleted

## 2022-10-26 NOTE — Telephone Encounter (Signed)
Spoke to pt and gave instructions for MPI. 

## 2022-10-29 ENCOUNTER — Ambulatory Visit (HOSPITAL_COMMUNITY): Payer: Medicare Other | Attending: Cardiology

## 2022-10-29 DIAGNOSIS — R079 Chest pain, unspecified: Secondary | ICD-10-CM | POA: Diagnosis not present

## 2022-10-29 LAB — MYOCARDIAL PERFUSION IMAGING
Base ST Depression (mm): 0 mm
LV dias vol: 85 mL (ref 62–150)
LV sys vol: 30 mL
Nuc Stress EF: 64 %
Peak HR: 76 {beats}/min
Rest HR: 53 {beats}/min
Rest Nuclear Isotope Dose: 10.6 mCi
SDS: 1
SRS: 0
SSS: 1
ST Depression (mm): 0 mm
Stress Nuclear Isotope Dose: 31.4 mCi
TID: 1.03

## 2022-10-29 MED ORDER — REGADENOSON 0.4 MG/5ML IV SOLN
0.4000 mg | Freq: Once | INTRAVENOUS | Status: AC
  Administered 2022-10-29: 0.4 mg via INTRAVENOUS

## 2022-10-29 MED ORDER — TECHNETIUM TC 99M TETROFOSMIN IV KIT
10.6000 | PACK | Freq: Once | INTRAVENOUS | Status: AC | PRN
  Administered 2022-10-29: 10.6 via INTRAVENOUS

## 2022-10-29 MED ORDER — TECHNETIUM TC 99M TETROFOSMIN IV KIT
31.4000 | PACK | Freq: Once | INTRAVENOUS | Status: AC | PRN
  Administered 2022-10-29: 31.4 via INTRAVENOUS

## 2022-11-02 ENCOUNTER — Telehealth: Payer: Self-pay | Admitting: Cardiology

## 2022-11-02 NOTE — Telephone Encounter (Signed)
Pt's wife would like for pt's most recent test results mailed to them. Please asdvise

## 2022-11-02 NOTE — Telephone Encounter (Signed)
Called patient. Patient requested stress test results to be mailed to him. Confirmed patient's address. Will mail to address on file. Patient verbalized understanding.

## 2022-11-26 ENCOUNTER — Telehealth: Payer: Self-pay | Admitting: Cardiovascular Disease

## 2022-11-26 NOTE — Telephone Encounter (Signed)
Pt c/o of Chest Pain: STAT if CP now or developed within 24 hours  1. Are you having CP right now? Yes, but this morning at 7am his chest was feeling very tight  2. Are you experiencing any other symptoms (ex. SOB, nausea, vomiting, sweating)? SOB and feeling very fatigue  3. How long have you been experiencing CP? A few months now  4. Is your CP continuous or coming and going? Coming and going  5. Have you taken Nitroglycerin? No, spouse stated he wasn't prescribed any.  ?

## 2022-11-26 NOTE — Telephone Encounter (Addendum)
Spoke to the pt wife, [place], [place] pt is c/o chest tightness, fatigue and shortness of breath. Pt wife stated pt is so weak " taking a shower wipes him out".  Mrs. Chavez stated pt experienced these symptoms for months. Explained ED precautions, pt wife stated "since we will not help her she will contact his PCP". Explained pt needs further evaluation,pt wife stated she will like appointment with Dr. Clifton James. Advised pt wife next available appointment is at the end of July. Once again, explained ED precautions, advised pt wife MD and nurse are currently not in the office. Pt wife voiced understanding. Will forward to MD and nurse.

## 2022-11-26 NOTE — Telephone Encounter (Signed)
Spoke with pt spouse scheduled OV for 11/28/22 at 11:20 am.  No further concerns voiced.

## 2022-11-27 NOTE — Progress Notes (Unsigned)
No chief complaint on file.  History of Present Illness:  77 yo male with history of carotid artery disease, chronic kidney disease, CAD, paroxysmal atrial fibrillation, HTN and hyperlipidemia here today for follow up. I saw him as a new patient 02/20/18 for evaluation of atrial fibrillation. He has been followed in the past by Dr. Eden Emms. He had a cardiac cath in 2010 with Dr. Juanda Chance and was found to have mild CAD. He was suspected to have atheroemboli during the cath and had worsened renal insufficiency post cath. He had not been on coumadin. Echo in 2012 showed normal LV systolic function. There was grade 1 diastolic dysfunction. He had an AV fistula placed back in 2011 and this was removed in 2015 as he never progressed to ESRD. He woke up on 02/10/18 and felt weak and his heart was racing. He went in to see Gaye Alken, NP and was found to have atrial fibrillation with HR 100 bpm. He did not tolerate Eliquis, Pradaxa or Xarelto due to a rash. I arranged an echo on 02/28/18 which showed normal LV systolic function, LVEF=55-60%. Cardiac monitor with atrial fib. He was seen in EP clinic by Dr. Johney Frame and was started on Flecainide. Carotid artery dopplers 11/11/17 with 50-69% narrowing bilateral ICA.  He was admitted to [facility] on 01/26/20 with slurred speech, swallowing trouble. He thought he was having a stroke. Head CT with no bleeding. Brain MRI with no evidence of stroke per pt. Records from [facility]: EKG 01/26/20 with NSR. CT head 01/26/20; no acute bleeding or infarct. Echo 01/26/20; LVEF=55-60%. Trace AI, MR. Carotid artery dopplers April 2023 with moderate RICA stenosis and mild LICA stensosis. He has refused to take coumadin and does not wish to try any of the DOACs again. He has infrequent episodes of atrial fibrillation and when he does he takes an extra Flecainide and the episodes resolve after an hour or two. He was seen in our office June 2024 by Dr. Anne Fu and c/o chest pain. Nuclear  stress test low risk without ischemia in June 2024. Carotid artery dopplers April 2024 with stable moderate bilateral carotid artery disease.   He is here today for follow up. The patient denies any dyspnea, palpitations, lower extremity edema, orthopnea, PND, dizziness, near syncope or syncope. He continues to have chest pain. ***    Primary Care Physician: Hurshel Party, NP  Past Medical History:  Diagnosis Date   Allergy    Arthritis    psoriatic arthritis    Asthma    Atheroembolic kidney disease (HCC)    Atheroembolism    CAD (coronary artery disease)    hx non obst   Cancer (HCC)    basal cell x3 , last one removed - 12/12/2013, R temple area of forehead   Chronic kidney disease    renal stones - tx. /w lithotripsy   Complication of anesthesia    GERD (gastroesophageal reflux disease)    HLD (hyperlipidemia)    HTN (hypertension)    Hypoglycemia    Paroxysmal atrial fibrillation (HCC)    PONV (postoperative nausea and vomiting)    Pruritus    Psoriasis    Shortness of breath    after eating , if he gets active, he might have some SOB   Vitamin D deficiency     Past Surgical History:  Procedure Laterality Date   AV FISTULA PLACEMENT  07-2009   Left Brachiocephalic AVF   CARDIAC CATHETERIZATION  2010   COLONOSCOPY  09/15/2012   Colonic polyp status post polypectomy. Mild sigmoid diverticulosis. Small internal hemorrhoids. Otherwise normal colonoscopy    LIGATION OF ARTERIOVENOUS  FISTULA Left 12/30/2013   Procedure: EXCISION OF LEFT ARM ARTERIOVENOUS  FISTULA;  Surgeon: Larina Earthly, MD;  Location: [facility] OR;  Service: Vascular;  Laterality: Left;   LITHOTRIPSY     skin cancer removed     multiple basal cell    Current Outpatient Medications  Medication Sig Dispense Refill   amLODipine (NORVASC) 10 MG tablet Take 10 mg by mouth daily with breakfast.     ascorbic acid (VITAMIN C) 500 MG tablet Take 500 mg by mouth daily.     betamethasone dipropionate (DIPROLENE) 0.05 %  cream Apply 1 application topically 2 (two) times daily as needed (for psoriasis).     cetirizine (ZYRTEC) 10 MG tablet Take 10 mg by mouth daily.     Evolocumab (REPATHA) 140 MG/ML SOSY Inject 140 mg into the skin every 21 ( twenty-one) days.     flecainide (TAMBOCOR) 50 MG tablet Take 50 mg by mouth in the morning and at bedtime.     labetalol (NORMODYNE) 200 MG tablet Take 300 mg by mouth once.      loratadine (CLARITIN) 10 MG tablet Take 10 mg by mouth daily.     Omega-3 Fatty Acids (FISH OIL) 1000 MG CAPS Take 1 capsule by mouth daily.     OVER THE COUNTER MEDICATION Calcium with Vitamin D 3 plus minerals. One tablet everyday.     OVER THE COUNTER MEDICATION Co Q 10, 100 mg daily.     OVER THE COUNTER MEDICATION Take 1 capsule by mouth daily at 2 am. Broccoli with myrosinase ( Life Extension Brand )     OVER THE COUNTER MEDICATION Mens' Bladder Support ( Life Extension Brand) 1 capsule daily     pravastatin (PRAVACHOL) 40 MG tablet Take 40 mg by mouth daily.      Triamcinolone Acetonide (KENALOG IJ) Inject 1 application as directed as directed. 3-4 months     No current facility-administered medications for this visit.    Allergies  Allergen Reactions   Hydrocodone Nausea Only   Oxycodone Nausea And Vomiting   Eliquis [Apixaban]     Itching and rash   Hydrochlorothiazide     anxious   Hytrin [Terazosin Hcl]     Heart races   Pradaxa [Dabigatran Etexilate Mesylate]     Itching and rash    Sulfa Antibiotics Itching   Xarelto [Rivaroxaban]     Itching and rash     Social History   Socioeconomic History   Marital status: Married    Spouse name: Not on file   Number of children: 0   Years of education: Not on file   Highest education level: Not on file  Occupational History   Occupation: Retired from a Education officer, environmental  Tobacco Use   Smoking status: Former    Types: Cigarettes    Quit date: 05/21/1970    Years since quitting: 52.5   Smokeless tobacco: Never   Tobacco  comments:    quit 40+ years ago   Vaping Use   Vaping Use: Never used  Substance and Sexual Activity   Alcohol use: No   Drug use: No   Sexual activity: Not on file  Other Topics Concern   Not on file  Social History Narrative   Married, lives with wife; Designer, television/film set.   L[facility] in [facility]   Social Determinants  of Health   Financial Resource Strain: Not on file  Food Insecurity: Not on file  Transportation Needs: Not on file  Physical Activity: Not on file  Stress: Not on file  Social Connections: Not on file  Intimate Partner Violence: Not on file    Family History  Problem Relation Age of Onset   Cancer Mother    Heart disease Father        Before age 36   Asthma Father    Heart disease Brother        After 51 years of age   Coronary artery disease Other        family hx   Colon cancer Neg Hx    Esophageal cancer Neg Hx    Liver cancer Neg Hx    Pancreatic cancer Neg Hx    Rectal cancer Neg Hx    Stomach cancer Neg Hx     Review of Systems:  As stated in the HPI and otherwise negative.   There were no vitals taken for this visit.  Physical Examination:  General: Well developed, well nourished, NAD  HEENT: OP clear, mucus membranes moist  SKIN: warm, dry. No rashes. Neuro: No focal deficits  Musculoskeletal: Muscle strength 5/5 all ext  Psychiatric: Mood and affect normal  Neck: No JVD, no carotid bruits, no thyromegaly, no lymphadenopathy.  Lungs:Clear bilaterally, no wheezes, rhonci, crackles Cardiovascular: Regular rate and rhythm. No murmurs, gallops or rubs. Abdomen:Soft. Bowel sounds present. Non-tender.  Extremities: No lower extremity edema. Pulses are 2 + in the bilateral DP/PT.  EKG:  EKG is *** ordered today. The ekg ordered today demonstrates   Lipids followed in primary care  Recent Labs: No results found for requested labs within last 365 days.   Lipid Panel  Wt Readings from Last 3 Encounters:  10/29/22 71.2 kg   10/24/22 71.2 kg  07/05/22 70.9 kg    Assessment and Plan:   1. Paroxysmal atrial fibrillation: Sinus today. Rare episodes of atrial fibrillation. He does not tolerate Xarelto, Eliquis or Pradaxa due to a rash. I have reviewed indication for coumadin again today. He does not want to start anti-coagulation. I have reviewed his CHADS VASC score of 3 which he understands indicated a 3.2 % risk per year of a stroke. Will continue ASA and Flecainide.   2. CAD with angina: ****.  Continue statin, beta blocker and Repatha. LDL ***   3. Carotid artery disease:  Carotid artery dopplers April 2024 with moderate RICA stenosis and mild LICA stensosis. Repeat carotid dopplers in April 2025.    4. HTN: BP is well controlled. Continue current therapy  Labs/ tests ordered today include:  No orders of the defined types were placed in this encounter.  Disposition:   F/U with me in 12 months.   Signed, Verne Carrow, MD 11/27/2022 3:39 PM    [facility] [address] [place], [place], [place]  [address] Phone: [phone]; Fax: [phone]

## 2022-11-28 ENCOUNTER — Ambulatory Visit: Payer: Medicare Other | Attending: Cardiovascular Disease | Admitting: Cardiovascular Disease

## 2022-11-28 VITALS — BP 126/68 | HR 53 | Ht 70.0 in | Wt 158.8 lb

## 2022-11-28 DIAGNOSIS — I25118 Atherosclerotic heart disease of native coronary artery with other forms of angina pectoris: Secondary | ICD-10-CM | POA: Diagnosis not present

## 2022-11-28 DIAGNOSIS — I10 Essential (primary) hypertension: Secondary | ICD-10-CM

## 2022-11-28 DIAGNOSIS — I6523 Occlusion and stenosis of bilateral carotid arteries: Secondary | ICD-10-CM

## 2022-11-28 DIAGNOSIS — I480 Paroxysmal atrial fibrillation: Secondary | ICD-10-CM | POA: Diagnosis not present

## 2022-11-28 DIAGNOSIS — R079 Chest pain, unspecified: Secondary | ICD-10-CM | POA: Diagnosis not present

## 2022-11-28 DIAGNOSIS — R5383 Other fatigue: Secondary | ICD-10-CM

## 2022-11-28 MED ORDER — RANOLAZINE ER 500 MG PO TB12: 500.0000 mg | ORAL_TABLET | Freq: Two times a day (BID) | ORAL | 3 refills | Status: DC

## 2022-11-28 NOTE — Patient Instructions (Addendum)
Medication Instructions:  Your physician has recommended you make the following change in your medication:  1.) start Ranexa 500 mg tablets - take one tablet twice a day  *If you need a refill on your cardiac medications before your next appointment, please call your pharmacy*   Lab Work: none If you have labs (blood work) drawn today and your tests are completely normal, you will receive your results only by: MyChart Message (if you have MyChart) OR A paper copy in the mail If you have any lab test that is abnormal or we need to change your treatment, we will call you to review the results.   Testing/Procedures: none   Follow-Up: At [facility], you and your health needs are our priority.  As part of our continuing mission to provide you with exceptional heart care, we have created designated Provider Care Teams.  These Care Teams include your primary Cardiologist (physician) and Advanced Practice Providers (APPs -  Physician Assistants and Nurse Practitioners) who all work together to provide you with the care you need, when you need it.  We recommend signing up for the patient portal called "MyChart".  Sign up information is provided on this After Visit Summary.  MyChart is used to connect with patients for Virtual Visits (Telemedicine).  Patients are able to view lab/test results, encounter notes, upcoming appointments, etc.  Non-urgent messages can be sent to your provider as well.   To learn more about what you can do with MyChart, go to [email or web address].    Your next appointment:   January 17, 2023  3:30 pm, Dr. Clifton James   Please follow up with your primary care doctor regarding weakness

## 2022-11-30 ENCOUNTER — Ambulatory Visit: Payer: Medicare Other | Admitting: Cardiovascular Disease

## 2022-12-13 DIAGNOSIS — E538 Deficiency of other specified B group vitamins: Secondary | ICD-10-CM | POA: Diagnosis not present

## 2022-12-13 DIAGNOSIS — D539 Nutritional anemia, unspecified: Secondary | ICD-10-CM | POA: Diagnosis not present

## 2022-12-13 DIAGNOSIS — E785 Hyperlipidemia, unspecified: Secondary | ICD-10-CM | POA: Diagnosis not present

## 2022-12-13 DIAGNOSIS — I10 Essential (primary) hypertension: Secondary | ICD-10-CM | POA: Diagnosis not present

## 2022-12-13 DIAGNOSIS — J309 Allergic rhinitis, unspecified: Secondary | ICD-10-CM | POA: Diagnosis not present

## 2022-12-13 DIAGNOSIS — R7309 Other abnormal glucose: Secondary | ICD-10-CM | POA: Diagnosis not present

## 2022-12-13 DIAGNOSIS — N184 Chronic kidney disease, stage 4 (severe): Secondary | ICD-10-CM | POA: Diagnosis not present

## 2022-12-13 DIAGNOSIS — E559 Vitamin D deficiency, unspecified: Secondary | ICD-10-CM | POA: Diagnosis not present

## 2022-12-13 DIAGNOSIS — R972 Elevated prostate specific antigen [PSA]: Secondary | ICD-10-CM | POA: Diagnosis not present

## 2022-12-24 DIAGNOSIS — M7751 Other enthesopathy of right foot: Secondary | ICD-10-CM | POA: Diagnosis not present

## 2022-12-25 DIAGNOSIS — N184 Chronic kidney disease, stage 4 (severe): Secondary | ICD-10-CM | POA: Diagnosis not present

## 2023-01-01 DIAGNOSIS — R972 Elevated prostate specific antigen [PSA]: Secondary | ICD-10-CM | POA: Diagnosis not present

## 2023-01-04 ENCOUNTER — Other Ambulatory Visit: Payer: Self-pay | Admitting: Urology

## 2023-01-04 DIAGNOSIS — R972 Elevated prostate specific antigen [PSA]: Secondary | ICD-10-CM

## 2023-01-07 DIAGNOSIS — J309 Allergic rhinitis, unspecified: Secondary | ICD-10-CM | POA: Diagnosis not present

## 2023-01-07 DIAGNOSIS — H9202 Otalgia, left ear: Secondary | ICD-10-CM | POA: Diagnosis not present

## 2023-01-11 ENCOUNTER — Emergency Department (HOSPITAL_COMMUNITY): Payer: Medicare Other

## 2023-01-11 ENCOUNTER — Telehealth: Payer: Self-pay | Admitting: Cardiovascular Disease

## 2023-01-11 ENCOUNTER — Encounter (HOSPITAL_COMMUNITY): Payer: Self-pay

## 2023-01-11 ENCOUNTER — Emergency Department (HOSPITAL_COMMUNITY)
Admission: EM | Admit: 2023-01-11 | Discharge: 2023-01-11 | Disposition: A | Discharge status: Home / Self Care | Payer: Medicare Other | Attending: Emergency Medicine | Admitting: Emergency Medicine

## 2023-01-11 ENCOUNTER — Other Ambulatory Visit: Payer: Self-pay

## 2023-01-11 DIAGNOSIS — J45909 Unspecified asthma, uncomplicated: Secondary | ICD-10-CM | POA: Insufficient documentation

## 2023-01-11 DIAGNOSIS — I129 Hypertensive chronic kidney disease with stage 1 through stage 4 chronic kidney disease, or unspecified chronic kidney disease: Secondary | ICD-10-CM | POA: Insufficient documentation

## 2023-01-11 DIAGNOSIS — I700 Atherosclerosis of aorta: Secondary | ICD-10-CM | POA: Diagnosis not present

## 2023-01-11 DIAGNOSIS — R079 Chest pain, unspecified: Secondary | ICD-10-CM | POA: Diagnosis not present

## 2023-01-11 DIAGNOSIS — I2510 Atherosclerotic heart disease of native coronary artery without angina pectoris: Secondary | ICD-10-CM | POA: Insufficient documentation

## 2023-01-11 DIAGNOSIS — N189 Chronic kidney disease, unspecified: Secondary | ICD-10-CM | POA: Insufficient documentation

## 2023-01-11 DIAGNOSIS — R0789 Other chest pain: Secondary | ICD-10-CM | POA: Diagnosis not present

## 2023-01-11 DIAGNOSIS — I249 Acute ischemic heart disease, unspecified: Secondary | ICD-10-CM | POA: Diagnosis not present

## 2023-01-11 DIAGNOSIS — I771 Stricture of artery: Secondary | ICD-10-CM | POA: Diagnosis not present

## 2023-01-11 LAB — CBC WITH DIFFERENTIAL/PLATELET
Abs Immature Granulocytes: 0.05 10*3/uL (ref 0.00–0.07)
Basophils Absolute: 0 10*3/uL (ref 0.0–0.1)
Basophils Relative: 0 %
Eosinophils Absolute: 0 10*3/uL (ref 0.0–0.5)
Eosinophils Relative: 0 %
HCT: 45.4 % (ref 39.0–52.0)
Hemoglobin: 15.6 g/dL (ref 13.0–17.0)
Immature Granulocytes: 1 %
Lymphocytes Relative: 26 %
Lymphs Abs: 2.4 10*3/uL (ref 0.7–4.0)
MCH: 31.3 pg (ref 26.0–34.0)
MCHC: 34.4 g/dL (ref 30.0–36.0)
MCV: 91.2 fL (ref 80.0–100.0)
Monocytes Absolute: 1.3 10*3/uL — ABNORMAL HIGH (ref 0.1–1.0)
Monocytes Relative: 14 %
Neutro Abs: 5.4 10*3/uL (ref 1.7–7.7)
Neutrophils Relative %: 59 %
Platelets: 240 10*3/uL (ref 150–400)
RBC: 4.98 MIL/uL (ref 4.22–5.81)
RDW: 13.8 % (ref 11.5–15.5)
WBC: 9.2 10*3/uL (ref 4.0–10.5)
nRBC: 0 % (ref 0.0–0.2)

## 2023-01-11 LAB — COMPREHENSIVE METABOLIC PANEL
ALT: 19 U/L (ref 0–44)
AST: 15 U/L (ref 15–41)
Albumin: 3.6 g/dL (ref 3.5–5.0)
Alkaline Phosphatase: 52 U/L (ref 38–126)
Anion gap: 16 — ABNORMAL HIGH (ref 5–15)
BUN: 31 mg/dL — ABNORMAL HIGH (ref 8–23)
CO2: 25 mmol/L (ref 22–32)
Calcium: 9.2 mg/dL (ref 8.9–10.3)
Chloride: 100 mmol/L (ref 98–111)
Creatinine, Ser: 2.04 mg/dL — ABNORMAL HIGH (ref 0.61–1.24)
GFR, Estimated: 33 mL/min — ABNORMAL LOW (ref >60)
Glucose, Bld: 98 mg/dL (ref 70–99)
Potassium: 3.7 mmol/L (ref 3.5–5.1)
Sodium: 141 mmol/L (ref 135–145)
Total Bilirubin: 0.8 mg/dL (ref 0.3–1.2)
Total Protein: 6.4 g/dL — ABNORMAL LOW (ref 6.5–8.1)

## 2023-01-11 LAB — TROPONIN I (HIGH SENSITIVITY)
Troponin I (High Sensitivity): 18 ng/L — ABNORMAL HIGH (ref <18)
Troponin I (High Sensitivity): 19 ng/L — ABNORMAL HIGH (ref <18)

## 2023-01-11 MED ORDER — NITROGLYCERIN 0.4 MG SL SUBL: 0.4000 mg | SUBLINGUAL_TABLET | SUBLINGUAL | Status: DC | PRN

## 2023-01-11 MED ORDER — ISOSORBIDE MONONITRATE ER 30 MG PO TB24: 30.0000 mg | ORAL_TABLET | Freq: Every day | ORAL | 0 refills | Status: DC

## 2023-01-11 NOTE — Telephone Encounter (Signed)
Spoke with pt and pt's wife who reports pt developed chest pain with radiation to his neck.  Pt took a nitroglycerin and pain somewhat resolved but then returned.  BP 164/97 with HR - 87.  BP after nitroglycerin 94/57.  Pt reports active chest pain.  Pt's wife advised to call 911 for further evaluation and transport to ED.  Pt's wife asks if she can drive pt to [facility].  Advised best plan would be EMS but that was their decision.  Pt's wife asking if pt should take his morning medications.  Advised most important thing now is to not delay care.  Pt's wife verbalizes understanding and agrees with current plan.

## 2023-01-11 NOTE — Discharge Instructions (Signed)
You were seen today for chest pain. While you were here we monitored your vitals, performed a physical exam, and checked blood work and imaging as well as an EKG.  We have also spoken with our cardiologists, who are comfortable with the returning home. These were all reassuring and there is no indication for any further testing or intervention in the emergency department at this time.   Things to do:  - Follow up with your primary care provider within the next 1-2 weeks -Please be sure to follow-up your cardiology appointment next week - Pick up your prescription for Imdur, a long-acting nitroglycerin supplement to help you with your chest pain.  This has been called into your pharmacy at [facility] drug-.Marland Kitchen  Return to the emergency department if you have any new or worsening symptoms including worsening chest pain, shortness of breath, etc., or if you have any other concerns.

## 2023-01-11 NOTE — ED Provider Notes (Signed)
[facility] EMERGENCY DEPARTMENT AT [facility] Provider Note  MDM   HPI/ROS:  Jack [name] is a 77 y.o. male with medical history of hypertension, hyperlipidemia, CKD, paroxysmal A-fib not on anticoagulation, CAD presenting with chief complaint of chest pain.  Patient has been experiencing intermittent chest pain for the past few months that is worse with exertion and radiates to his neck and left arm.  Claims the pain resolves when he is at rest but worsens when he moves around.  Describes sensation as a heaviness.  Took a dose of sublingual nitroglycerin this morning, which he feels relieved his pain.  Denies pleuritic component, nausea/vomiting, diaphoresis.  No recent infectious symptoms.  Of note, patient has a history of paroxysmal A-fib and was trialed on multiple DOACs but was unable to tolerate any of them secondary to urticarial reactions.  He also received a LHC 15 years ago that demonstrated moderate Neri disease.  Patient has a follow-up appoint with his cardiologist next week and has been discussing undergoing another catheterization; however, patient experienced severe AKI after last heart cath and has been trepidation [place] about receiving another.  Physical exam is notable for: - Overall well-appearing, no acute distress - Cardiopulmonary exam within normal limits - No lower extremity edema  On my initial evaluation, patient is:  -Vital signs stable. Patient afebrile, hemodynamically stable, and non-toxic appearing. -Additional history obtained from wife at bedside, chart review  Given the patient's history and physical exam, differential diagnosis includes but is not limited to ACS, PE, aortic pathology, pneumothorax, etc.  Interpretations, interventions, and the patient's course of care are documented below.    Cardiopulmonary workup initiated.  Initial EKG normal sinus rhythm with some mild ST depressions in inferior and lateral leads, somewhat more pronounced from  prior.  Initial troponin of 18.  Remainder of workup within normal limits.  Reach out to cardiology given high risk chest pain.  Given that patient recently had a nuclear scan and has a cardiology follow-up appointment next week, no necessity for admission and additional testing at this point.  Patient to be started on long-acting nitro for ongoing anginal symptoms.  Repeat troponin resulted at 19, reassuring.  At this time, patient has been deemed safe and stable for discharge.  Instructed follow-up with PCP and cardiology.  Strict return precautions voiced.  Disposition:  I discussed the plan for discharge with the patient and/or their surrogate at bedside prior to discharge and they were in agreement with the plan and verbalized understanding of the return precautions provided. All questions answered to the best of my ability. Ultimately, the patient was discharged in stable condition with stable vital signs. I am reassured that they are capable of close follow up and good social support at home.   Clinical Impression:  1. Chest pain, unspecified type     Rx / DC Orders ED Discharge Orders          Ordered    isosorbide mononitrate (IMDUR) 30 MG 24 hr tablet  Daily        01/11/23 1614            The plan for this patient was discussed with Dr. Theresia Lo, who voiced agreement and who oversaw evaluation and treatment of this patient.   Clinical Complexity A medically appropriate history, review of systems, and physical exam was performed.  My independent interpretations of EKG, labs, and radiology are documented in the ED course above.   Click here for ABCD2, HEART and other  calculatorsREFRESH Note before signing   Patient's presentation is most consistent with acute presentation with potential threat to life or bodily function.  Medical Decision Making Amount and/or Complexity of Data Reviewed Labs: ordered. Radiology: ordered.  Risk Prescription drug  management.    HPI/ROS      See MDM section for pertinent HPI and ROS. A complete ROS was performed with pertinent positives/negatives noted above.   Past Medical History:  Diagnosis Date   Allergy    Arthritis    psoriatic arthritis    Asthma    Atheroembolic kidney disease (HCC)    Atheroembolism    CAD (coronary artery disease)    hx non obst   Cancer (HCC)    basal cell x3 , last one removed - 12/12/2013, R temple area of forehead   Chronic kidney disease    renal stones - tx. /w lithotripsy   Complication of anesthesia    GERD (gastroesophageal reflux disease)    HLD (hyperlipidemia)    HTN (hypertension)    Hypoglycemia    Paroxysmal atrial fibrillation (HCC)    PONV (postoperative nausea and vomiting)    Pruritus    Psoriasis    Shortness of breath    after eating , if he gets active, he might have some SOB   Vitamin D deficiency     Past Surgical History:  Procedure Laterality Date   AV FISTULA PLACEMENT  07-2009   Left Brachiocephalic AVF   CARDIAC CATHETERIZATION  2010   COLONOSCOPY  09/15/2012   Colonic polyp status post polypectomy. Mild sigmoid diverticulosis. Small internal hemorrhoids. Otherwise normal colonoscopy    LIGATION OF ARTERIOVENOUS  FISTULA Left 12/30/2013   Procedure: EXCISION OF LEFT ARM ARTERIOVENOUS  FISTULA;  Surgeon: Larina Earthly, MD;  Location: [facility] OR;  Service: Vascular;  Laterality: Left;   LITHOTRIPSY     skin cancer removed     multiple basal cell      Physical Exam   Vitals:   01/11/23 1400 01/11/23 1430 01/11/23 1500 01/11/23 1530  BP: (!) 139/105 (!) 157/91 (!) 134/95 (!) 140/93  Pulse: 68 66 (!) 51 74  Resp: (!) 23 18 18    Temp:      SpO2: 100% 98% 100% 98%  Weight:      Height:        Physical Exam Vitals and nursing note reviewed.  Constitutional:      General: He is not in acute distress.    Appearance: He is well-developed.  HENT:     Head: Normocephalic and atraumatic.  Eyes:     Conjunctiva/sclera:  Conjunctivae normal.  Cardiovascular:     Rate and Rhythm: Normal rate and regular rhythm.     Heart sounds: No murmur heard. Pulmonary:     Effort: Pulmonary effort is normal. No respiratory distress.     Breath sounds: Normal breath sounds.  Abdominal:     Palpations: Abdomen is soft.     Tenderness: There is no abdominal tenderness.  Musculoskeletal:        General: No swelling.     Cervical back: Neck supple.  Skin:    General: Skin is warm and dry.     Capillary Refill: Capillary refill takes less than 2 seconds.  Neurological:     Mental Status: He is alert.  Psychiatric:        Mood and Affect: Mood normal.    Starleen Arms, MD Department of Emergency Medicine   Please note that this documentation  was produced with the assistance of voice-to-text technology and may contain errors.    Dyanne Iha, MD 01/11/23 1616    Elayne Snare K, DO 01/12/23 0700

## 2023-01-11 NOTE — ED Triage Notes (Addendum)
Patient reports chest pain that has gotten worse over the past.  Hx of chest pain but this is way worse than it has been.  Radiates to left arm and left neck.  No pain to back.  Mild sob. Reports worse with exertion.  Patient took a nitroglycerin and pain improved.  Has hx of afib but can't take any thinners due to being allergic.

## 2023-01-11 NOTE — Telephone Encounter (Signed)
Pt c/o of Chest Pain: STAT if CP now or developed within 24 hours  1. Are you having CP right now? Yes   2. Are you experiencing any other symptoms (ex. SOB, nausea, vomiting, sweating)? No  3. How long have you been experiencing CP? A month   4. Is your CP continuous or coming and going? Coming and going   5. Have you taken Nitroglycerin? Yes ?

## 2023-01-16 ENCOUNTER — Telehealth (HOSPITAL_COMMUNITY): Payer: Self-pay

## 2023-01-17 ENCOUNTER — Encounter: Payer: Self-pay | Admitting: Cardiovascular Disease

## 2023-01-17 ENCOUNTER — Ambulatory Visit: Payer: Medicare Other | Attending: Cardiovascular Disease | Admitting: Cardiovascular Disease

## 2023-01-17 VITALS — BP 100/62 | HR 71 | Ht 70.0 in | Wt 154.0 lb

## 2023-01-17 DIAGNOSIS — I25118 Atherosclerotic heart disease of native coronary artery with other forms of angina pectoris: Secondary | ICD-10-CM | POA: Diagnosis not present

## 2023-01-17 DIAGNOSIS — I6523 Occlusion and stenosis of bilateral carotid arteries: Secondary | ICD-10-CM | POA: Diagnosis not present

## 2023-01-17 DIAGNOSIS — I480 Paroxysmal atrial fibrillation: Secondary | ICD-10-CM | POA: Diagnosis not present

## 2023-01-17 DIAGNOSIS — I10 Essential (primary) hypertension: Secondary | ICD-10-CM

## 2023-01-17 MED ORDER — PANTOPRAZOLE SODIUM 40 MG PO TBEC: 40.0000 mg | DELAYED_RELEASE_TABLET | Freq: Every day | ORAL | 3 refills | Status: DC

## 2023-01-17 MED ORDER — NITROGLYCERIN 0.4 MG SL SUBL: 0.4000 mg | SUBLINGUAL_TABLET | SUBLINGUAL | 3 refills | Status: DC | PRN

## 2023-01-17 MED ORDER — ISOSORBIDE MONONITRATE ER 30 MG PO TB24: 30.0000 mg | ORAL_TABLET | Freq: Every day | ORAL | 3 refills | Status: DC

## 2023-01-17 NOTE — Patient Instructions (Signed)
Medication Instructions:  Your physician has recommended you make the following change in your medication:    Start Protonix (pantoprazole) 40 mg daily  *If you need a refill on your cardiac medications before your next appointment, please call your pharmacy*  Follow-Up: At [facility], you and your health needs are our priority.  As part of our continuing mission to provide you with exceptional heart care, we have created designated Provider Care Teams.  These Care Teams include your primary Cardiologist (physician) and Advanced Practice Providers (APPs -  Physician Assistants and Nurse Practitioners) who all work together to provide you with the care you need, when you need it.  We recommend signing up for the patient portal called "MyChart".  Sign up information is provided on this After Visit Summary.  MyChart is used to connect with patients for Virtual Visits (Telemedicine).  Patients are able to view lab/test results, encounter notes, upcoming appointments, etc.  Non-urgent messages can be sent to your provider as well.   To learn more about what you can do with MyChart, go to [email or web address].    Your next appointment:   3 month(s)  Provider:   Verne Carrow, MD

## 2023-01-17 NOTE — Progress Notes (Signed)
Chief Complaint  Patient presents with   Follow-up    CAD   History of Present Illness:  77 yo male with history of carotid artery disease, chronic kidney disease, CAD, paroxysmal atrial fibrillation, HTN and hyperlipidemia here today for follow up. I saw him as a new patient 02/20/18 for evaluation of atrial fibrillation. He has been followed in the past by Dr. Eden Emms. He had a cardiac cath in 2010 with Dr. Juanda Chance and was found to have mild CAD. He was suspected to have atheroemboli during the cath and had worsened renal insufficiency post cath. He had not been on coumadin. Echo in 2012 showed normal LV systolic function. There was grade 1 diastolic dysfunction. He had an AV fistula placed back in 2011 and this was removed in 2015 as he never progressed to ESRD. He woke up on 02/10/18 and felt weak and his heart was racing. He went in to see Gaye Alken, NP and was found to have atrial fibrillation with HR 100 bpm. He did not tolerate Eliquis, Pradaxa or Xarelto due to a rash. I arranged an echo on 02/28/18 which showed normal LV systolic function, LVEF=55-60%. Cardiac monitor with atrial fib. He was seen in EP clinic by Dr. Johney Frame and was started on Flecainide. Carotid artery dopplers 11/11/17 with 50-69% narrowing bilateral ICA.  He was admitted to [facility] on 01/26/20 with slurred speech, swallowing trouble. He thought he was having a stroke. Head CT with no bleeding. Brain MRI with no evidence of stroke per pt. Records from [facility]: EKG 01/26/20 with NSR. CT head 01/26/20; no acute bleeding or infarct. Echo 01/26/20; LVEF=55-60%. Trace AI, MR. Carotid artery dopplers April 2023 with moderate RICA stenosis and mild LICA stensosis. He has refused to take coumadin and does not wish to try any of the DOACs again. He has infrequent episodes of atrial fibrillation and when he does he takes an extra Flecainide and the episodes resolve after an hour or two. He was seen in our office June 2024 by Dr.  Anne Fu and c/o chest pain. Nuclear stress test low risk without ischemia in June 2024. Carotid artery dopplers April 2024 with stable moderate bilateral carotid artery disease. I saw him in July 2024 and he c/o fatigue and described occasional chest pain at rest and with exertion. He did not wish to consider a cardiac cath due to prior complications during cath and fear of worsening of his renal function post cath. Ranexa was added at the visit in July 2024. He was seen in the ED on 01/11/23 with chest pain. EKG without ischemic changes. Troponin negative x 2. Imdur added and he was discharged home.   He is here today for follow up. The patient denies any dyspnea, palpitations, lower extremity edema, orthopnea, PND, dizziness, near syncope or syncope. He has had several episodes of chest pain over the past few days but not as severe as last week. He also tells me today that his chest pain/epigastric pain seems to be after meals and when lying flat after brushing his teeth. No recent GI evaluation. The pain is often relieved with belching.    Primary Care Physician: Hurshel Party, NP  Past Medical History:  Diagnosis Date   Allergy    Arthritis    psoriatic arthritis    Asthma    Atheroembolic kidney disease (HCC)    Atheroembolism    CAD (coronary artery disease)    hx non obst   Cancer (HCC)  basal cell x3 , last one removed - 12/12/2013, R temple area of forehead   Chronic kidney disease    renal stones - tx. /w lithotripsy   Complication of anesthesia    GERD (gastroesophageal reflux disease)    HLD (hyperlipidemia)    HTN (hypertension)    Hypoglycemia    Paroxysmal atrial fibrillation (HCC)    PONV (postoperative nausea and vomiting)    Pruritus    Psoriasis    Shortness of breath    after eating , if he gets active, he might have some SOB   Vitamin D deficiency     Past Surgical History:  Procedure Laterality Date   AV FISTULA PLACEMENT  07-2009   Left Brachiocephalic AVF    CARDIAC CATHETERIZATION  2010   COLONOSCOPY  09/15/2012   Colonic polyp status post polypectomy. Mild sigmoid diverticulosis. Small internal hemorrhoids. Otherwise normal colonoscopy    LIGATION OF ARTERIOVENOUS  FISTULA Left 12/30/2013   Procedure: EXCISION OF LEFT ARM ARTERIOVENOUS  FISTULA;  Surgeon: Larina Earthly, MD;  Location: [facility] OR;  Service: Vascular;  Laterality: Left;   LITHOTRIPSY     skin cancer removed     multiple basal cell    Current Outpatient Medications  Medication Sig Dispense Refill   amLODipine (NORVASC) 10 MG tablet Take 10 mg by mouth daily with breakfast.     ascorbic acid (VITAMIN C) 500 MG tablet Take 500 mg by mouth daily.     aspirin EC 81 MG tablet Take 81 mg by mouth daily. Swallow whole.     betamethasone dipropionate (DIPROLENE) 0.05 % cream Apply 1 application topically 2 (two) times daily as needed (for psoriasis).     Cholecalciferol (VITAMIN D-3) 125 MCG (5000 UT) TABS Take 1 tablet by mouth daily.     Evolocumab (REPATHA) 140 MG/ML SOSY Inject 140 mg into the skin every 21 ( twenty-one) days.     flecainide (TAMBOCOR) 50 MG tablet Take 50 mg by mouth in the morning and at bedtime.     labetalol (NORMODYNE) 200 MG tablet Take 200 mg by mouth See admin instructions. Take 200 mg in the morning and 100 mg at night.     loratadine (CLARITIN) 10 MG tablet Take 10 mg by mouth daily.     nitroGLYCERIN (NITROSTAT) 0.4 MG SL tablet Place 1 tablet (0.4 mg total) under the tongue every 5 (five) minutes as needed for chest pain. 25 tablet 3   Omega-3 Fatty Acids (FISH OIL) 1000 MG CAPS Take 1 capsule by mouth daily.     OVER THE COUNTER MEDICATION Co Q 10, 100 mg daily.     pantoprazole (PROTONIX) 40 MG tablet Take 1 tablet (40 mg total) by mouth daily. 90 tablet 3   pravastatin (PRAVACHOL) 40 MG tablet Take 40 mg by mouth daily.      Triamcinolone Acetonide (KENALOG IJ) Inject 1 application as directed as directed. 3-4 months     isosorbide mononitrate (IMDUR) 30 MG  24 hr tablet Take 1 tablet (30 mg total) by mouth daily. 90 tablet 3   No current facility-administered medications for this visit.    Allergies  Allergen Reactions   Hydrocodone Nausea Only   Oxycodone Nausea And Vomiting   Eliquis [Apixaban]     Itching and rash   Hydrochlorothiazide     anxious   Hytrin [Terazosin Hcl]     Heart races  Pt's wife unfamiliar (01/11/23)   Pradaxa [Dabigatran Etexilate Mesylate]  Itching and rash    Sulfa Antibiotics Itching   Xarelto [Rivaroxaban]     Itching and rash     Social History   Socioeconomic History   Marital status: Married    Spouse name: Not on file   Number of children: 0   Years of education: Not on file   Highest education level: Not on file  Occupational History   Occupation: Retired from a Education officer, environmental  Tobacco Use   Smoking status: Former    Current packs/day: 0.00    Types: Cigarettes    Quit date: 05/21/1970    Years since quitting: 52.6   Smokeless tobacco: Never   Tobacco comments:    quit 40+ years ago   Vaping Use   Vaping status: Never Used  Substance and Sexual Activity   Alcohol use: No   Drug use: No   Sexual activity: Not on file  Other Topics Concern   Not on file  Social History Narrative   Married, lives with wife; Designer, television/film set.   Lives in [facility]   Social Determinants of Health   Financial Resource Strain: Not on file  Food Insecurity: Not on file  Transportation Needs: Not on file  Physical Activity: Not on file  Stress: Not on file  Social Connections: Not on file  Intimate Partner Violence: Not on file    Family History  Problem Relation Age of Onset   Cancer Mother    Heart disease Father        Before age 48   Asthma Father    Heart disease Brother        After 60 years of age   Coronary artery disease Other        family hx   Colon cancer Neg Hx    Esophageal cancer Neg Hx    Liver cancer Neg Hx    Pancreatic cancer Neg Hx    Rectal cancer Neg Hx     Stomach cancer Neg Hx     Review of Systems:  As stated in the HPI and otherwise negative.   BP 100/62   Pulse 71   Ht 5\' 10"  (1.778 m)   Wt 69.9 kg   SpO2 99%   BMI 22.10 kg/m   Physical Examination: General: Well developed, well nourished, NAD  HEENT: OP clear, mucus membranes moist  SKIN: warm, dry. No rashes. Neuro: No focal deficits  Musculoskeletal: Muscle strength 5/5 all ext  Psychiatric: Mood and affect normal  Neck: No JVD, no carotid bruits, no thyromegaly, no lymphadenopathy.  Lungs:Clear bilaterally, no wheezes, rhonci, crackles Cardiovascular: Regular rate and rhythm. No murmurs, gallops or rubs. Abdomen:Soft. Bowel sounds present. Non-tender.  Extremities: No lower extremity edema. Pulses are 2 + in the bilateral DP/PT.  EKG:  EKG is not ordered today. The ekg ordered today demonstrates   Lipid Panel: Lipids followed in primary care  Recent Labs: 01/11/2023: ALT 19; BUN 31; Creatinine, Ser 2.04; Hemoglobin 15.6; Platelets 240; Potassium 3.7; Sodium 141   Lipid Panel  Wt Readings from Last 3 Encounters:  01/17/23 69.9 kg  01/11/23 71.7 kg  11/28/22 72 kg    Assessment and Plan:   1. Paroxysmal atrial fibrillation: Sinus today on exam. He does not tolerate Xarelto, Eliquis or Pradaxa due to a rash. We have discussed coumadin again today. He does not want to start anti-coagulation. I have reviewed his CHADS VASC score of 3 which he understands indicated a 3.2 % risk per year  of a stroke. Continue ASA and Flecainide.   2. CAD with angina: It is not clear that his chest pain is cardiac related. He has had some improvement with Imdur but the timing of his pain seems to be related to meals, always at rest and improved with belching. I suspect that he may have GERD and esophageal spasm.  We did discuss cardiac cath again today to exclude obstructive CAD but he again refuses to consider a cath. He is not an ideal candidate for cath or coronary CTA given his renal  function and his fear of procedures after distal embolization occurred with cath in 2010. I will start Protonix 40 mg daily to see if this helps his pain. I have asked him to see his GI doctor as well.  Continue beta blocker, statin, Repatha and Imdur. SL NTG as needed.     3. Carotid artery disease:  Carotid artery dopplers April 2024 with moderate RICA stenosis and mild LICA stensosis. Will repeat carotid artery dopplers in April 2025.    4. HTN: BP is controlled. No changes.   Labs/ tests ordered today include:   No orders of the defined types were placed in this encounter.  Disposition:   F/U with me in 12 months.   Signed, Verne Carrow, MD 01/17/2023 4:10 PM    [facility] [address] [place], [place], [place]  [address] Phone: [phone]; Fax: [phone]

## 2023-01-31 DIAGNOSIS — Z6822 Body mass index (BMI) 22.0-22.9, adult: Secondary | ICD-10-CM | POA: Diagnosis not present

## 2023-01-31 DIAGNOSIS — R079 Chest pain, unspecified: Secondary | ICD-10-CM | POA: Diagnosis not present

## 2023-01-31 DIAGNOSIS — K219 Gastro-esophageal reflux disease without esophagitis: Secondary | ICD-10-CM | POA: Diagnosis not present

## 2023-01-31 DIAGNOSIS — R1013 Epigastric pain: Secondary | ICD-10-CM | POA: Diagnosis not present

## 2023-02-04 DIAGNOSIS — C44519 Basal cell carcinoma of skin of other part of trunk: Secondary | ICD-10-CM | POA: Diagnosis not present

## 2023-02-04 DIAGNOSIS — D485 Neoplasm of uncertain behavior of skin: Secondary | ICD-10-CM | POA: Diagnosis not present

## 2023-02-04 DIAGNOSIS — L570 Actinic keratosis: Secondary | ICD-10-CM | POA: Diagnosis not present

## 2023-03-21 DIAGNOSIS — E785 Hyperlipidemia, unspecified: Secondary | ICD-10-CM | POA: Diagnosis not present

## 2023-03-21 DIAGNOSIS — R42 Dizziness and giddiness: Secondary | ICD-10-CM | POA: Diagnosis not present

## 2023-03-21 DIAGNOSIS — R972 Elevated prostate specific antigen [PSA]: Secondary | ICD-10-CM | POA: Diagnosis not present

## 2023-03-21 DIAGNOSIS — N184 Chronic kidney disease, stage 4 (severe): Secondary | ICD-10-CM | POA: Diagnosis not present

## 2023-03-21 DIAGNOSIS — I10 Essential (primary) hypertension: Secondary | ICD-10-CM | POA: Diagnosis not present

## 2023-03-21 DIAGNOSIS — J309 Allergic rhinitis, unspecified: Secondary | ICD-10-CM | POA: Diagnosis not present

## 2023-03-21 DIAGNOSIS — D539 Nutritional anemia, unspecified: Secondary | ICD-10-CM | POA: Diagnosis not present

## 2023-03-21 DIAGNOSIS — R7309 Other abnormal glucose: Secondary | ICD-10-CM | POA: Diagnosis not present

## 2023-04-02 ENCOUNTER — Encounter: Payer: Self-pay | Admitting: Urology

## 2023-04-09 DIAGNOSIS — C44319 Basal cell carcinoma of skin of other parts of face: Secondary | ICD-10-CM | POA: Diagnosis not present

## 2023-04-09 DIAGNOSIS — L820 Inflamed seborrheic keratosis: Secondary | ICD-10-CM | POA: Diagnosis not present

## 2023-04-11 DIAGNOSIS — Z2821 Immunization not carried out because of patient refusal: Secondary | ICD-10-CM | POA: Diagnosis not present

## 2023-04-11 DIAGNOSIS — I480 Paroxysmal atrial fibrillation: Secondary | ICD-10-CM | POA: Diagnosis not present

## 2023-04-11 DIAGNOSIS — R42 Dizziness and giddiness: Secondary | ICD-10-CM | POA: Diagnosis not present

## 2023-04-11 DIAGNOSIS — E538 Deficiency of other specified B group vitamins: Secondary | ICD-10-CM | POA: Diagnosis not present

## 2023-04-15 ENCOUNTER — Ambulatory Visit
Admission: RE | Admit: 2023-04-15 | Discharge: 2023-04-15 | Disposition: A | Discharge status: Home / Self Care | Payer: Medicare Other | Source: Ambulatory Visit | Attending: Urology | Admitting: Urology

## 2023-04-15 ENCOUNTER — Other Ambulatory Visit: Payer: Self-pay | Admitting: Urology

## 2023-04-15 DIAGNOSIS — R972 Elevated prostate specific antigen [PSA]: Secondary | ICD-10-CM

## 2023-04-15 MED ORDER — GADOPICLENOL 0.5 MMOL/ML IV SOLN
7.5000 mL | Freq: Once | INTRAVENOUS | Status: AC | PRN
  Administered 2023-04-15: 7.5 mL via INTRAVENOUS

## 2023-05-09 DIAGNOSIS — I10 Essential (primary) hypertension: Secondary | ICD-10-CM | POA: Diagnosis not present

## 2023-05-09 DIAGNOSIS — R42 Dizziness and giddiness: Secondary | ICD-10-CM | POA: Diagnosis not present

## 2023-05-09 DIAGNOSIS — I2510 Atherosclerotic heart disease of native coronary artery without angina pectoris: Secondary | ICD-10-CM | POA: Diagnosis not present

## 2023-05-09 DIAGNOSIS — I480 Paroxysmal atrial fibrillation: Secondary | ICD-10-CM | POA: Diagnosis not present

## 2023-05-27 ENCOUNTER — Other Ambulatory Visit (HOSPITAL_BASED_OUTPATIENT_CLINIC_OR_DEPARTMENT_OTHER): Payer: Self-pay

## 2023-05-27 MED ORDER — FLECAINIDE ACETATE 50 MG PO TABS
50.0000 mg | ORAL_TABLET | Freq: Two times a day (BID) | ORAL | 3 refills | Status: DC
  Filled 2023-05-27: qty 180, 90d supply, fill #0

## 2023-05-27 NOTE — Progress Notes (Signed)
 Cardiology Office Note    Patient Name: Jack [name] Date of Encounter: 05/29/2023  Primary Care Provider:  Erick Greig LABOR, NP Primary Cardiologist:  Lonni Cash, MD Primary Electrophysiologist: None   Past Medical History    Past Medical History:  Diagnosis Date   Allergy    Arthritis    psoriatic arthritis    Asthma    Atheroembolic kidney disease (HCC)    Atheroembolism    CAD (coronary artery disease)    hx non obst   Cancer (HCC)    basal cell x3 , last one removed - 12/12/2013, R temple area of forehead   Chronic kidney disease    renal stones - tx. /w lithotripsy   Complication of anesthesia    GERD (gastroesophageal reflux disease)    HLD (hyperlipidemia)    HTN (hypertension)    Hypoglycemia    Paroxysmal atrial fibrillation (HCC)    PONV (postoperative nausea and vomiting)    Pruritus    Psoriasis    Shortness of breath    after eating , if he gets active, he might have some SOB   Vitamin D deficiency     History of Present Illness  ALDEAN PIPE is a 78 y.o. male with a PMH of paroxysmal AF, HTN, HLD, carotid artery disease (RICA 50 -69% and LICA 50% stenosis), CKD, CAD who presents today for 51-month follow-up.  Jack [name] was initially followed by Dr. Karyle and is currently followed by Dr. Cash for management of CAD and paroxysmal AF.  Had a LHC performed 04/2009 that showed nonobstructive disease.  He had atheroembolism post cath with progressive renal failure and AV fistula was placed which was eventually removed due to improved renal function.  He was diagnosed with AF in 2019 and was started on Eliquis .  He completed carotid Dopplers on 10/2017 with 50 to 69% narrowing bilaterally. He completed a stress test in 2019 that was low risk with no ischemia.  2D echo was completed 2019 that showed EF of 55 to 60% and patient could not tolerate Eliquis , Xarelto  or Pradaxa  due to rash. e was admitted to [facility] on 01/26/20 with slurred speech,  swallowing trouble. He thought he was having a stroke. Head CT with no bleeding. Brain MRI with no evidence of stroke per pt.   He was seen in 10/2022 by Dr. Jeffrie with complaint of chest pain and underwent nuclear stress test that was low risk without ischemia.  He was seen in follow-up on 11/2022 with complaint of fatigue and occasional chest pain with recommendation of LHC but patient refused due to previous complications and fear of worsening renal function.  He was started on Ranexa  and was seen in the ED on 01/11/2023 with chest pain and EKG showed no ischemic changes and Imdur  was added with patient being discharged.  He was seen last on 01/17/2023 and noted several episodes of chest pain but not as severe as previous.  He reported that his discomfort occurs after a meal and lying flat and after brushing his teeth.  He notes that the pain is relieved with belching.  He is intolerant to Eliquis , Xarelto  and Pradaxa  and refused Coumadin with understanding of CHA2DS2-VASc score and risk.  He was started on Protonix  40 mg and was advised to see GI doctor for further evaluation.  He will have repeat carotid artery scan in 08/2023  Jack [name], reports ongoing episodes of AFib, which are sometimes severe enough to wake him from sleep.  The patient has been on multiple medications including Ranexa , isosorbide , flecainide , and Repatha . Despite these medications, the patient continues to experience symptoms. The patient has refused a heart catheterization due to concerns about his kidney function. The patient also reports significant weakness and fatigue, especially after physical activity. The patient's blood pressure is monitored at home and appears to be well-controlled. Patient denies chest pain, palpitations, dyspnea, PND, orthopnea, nausea, vomiting, dizziness, syncope, edema, weight gain, or early satiety.   Review of Systems  Please see the history of present illness.    All other systems reviewed and are  otherwise negative except as noted above.  Physical Exam    Wt Readings from Last 3 Encounters:  05/29/23 156 lb 9.6 oz (71 kg)  01/17/23 154 lb (69.9 kg)  01/11/23 158 lb (71.7 kg)   VS: Vitals:   05/29/23 1540 05/29/23 1710  BP: (!) 138/98 (!) 136/92  Pulse: 75   SpO2: 97%   ,Body mass index is 22.47 kg/m. GEN: Well nourished, well developed in no acute distress Neck: No JVD; bilateral carotid bruit Pulmonary: Clear to auscultation without rales, wheezing or rhonchi  Cardiovascular: Normal rate. Regular rhythm. Normal S1. Normal S2.   Murmurs: There is no murmur.  ABDOMEN: Soft, non-tender, non-distended EXTREMITIES:  No edema; No deformity slight weakness in legs  EKG/LABS/ Recent Cardiac Studies   ECG personally reviewed by me today -none completed today  Risk Assessment/Calculations:    CHA2DS2-VASc Score = 4   This indicates a 4.8% annual risk of stroke. The patient's score is based upon: CHF History: 0 HTN History: 1 Diabetes History: 0 Stroke History: 0 Vascular Disease History: 1 Age Score: 2 Gender Score: 0         Lab Results  Component Value Date   WBC 9.2 01/11/2023   HGB 15.6 01/11/2023   HCT 45.4 01/11/2023   MCV 91.2 01/11/2023   PLT 240 01/11/2023   Lab Results  Component Value Date   CREATININE 2.04 (H) 01/11/2023   BUN 31 (H) 01/11/2023   NA 141 01/11/2023   K 3.7 01/11/2023   CL 100 01/11/2023   CO2 25 01/11/2023   Lab Results  Component Value Date   CHOL (H) 05/18/2009    237        ATP III CLASSIFICATION:  <200     mg/dL   Desirable  799-760  mg/dL   Borderline High  >=759    mg/dL   High          HDL 38 (L) 05/18/2009   LDLCALC (H) 05/18/2009    173        Total Cholesterol/HDL:CHD Risk Coronary Heart Disease Risk Table                     Men   Women  1/2 Average Risk   3.4   3.3  Average Risk       5.0   4.4  2 X Average Risk   9.6   7.1  3 X Average Risk  23.4   11.0        Use the calculated Patient  Ratio above and the CHD Risk Table to determine the patient's CHD Risk.        ATP III CLASSIFICATION (LDL):  <100     mg/dL   Optimal  899-870  mg/dL   Near or Above  Optimal  130-159  mg/dL   Borderline  839-810  mg/dL   High  >809     mg/dL   Very High   TRIG 871 05/18/2009   CHOLHDL 6.2 05/18/2009    No results found for: HGBA1C Assessment & Plan    1.  Coronary artery disease: -Last ischemic evaluation completed by Lexiscan  Myoview  on 10/29/2022 low risk with small apical lateral region possible ischemia -Patient unable to complete LHC due to history of suffered during previous heart cath. -Continue current GDMT with Repatha  and 40 mg daily Imdur  30 mg daily -ED precautions advised for stuttering CP nit releived with Imdur   2.  Paroxysmal AF: Recurrent episodes, often waking patient from sleep. Currently on Flecainide  50mg  BID. Episodes may be contributing to patient's fatigue and shortness of breath. -Increase Flecainide  to 100mg  BID. -Order 14-day event monitor to assess AFib burden and response to increased Flecainide  dose. -Consider referral back to AFib clinic for further management if increased Flecainide  dose does not adequately control AFib.  3.  Essential hypertension: -Patient's blood pressure today in office was elevated at 138/98 -Patient provided home readings which are controlled 130s over 60s and 70s -Patient advised to continue to monitor BP at home -Continue pain 5 mg and labetalol  100 mg twice daily  4.  Carotid artery disease: Repeat carotid scan due in April. -Schedule repeat carotid scan for 09/13/2023.  5.  Chest pain: Chest pain controlled with Isosorbide . No chest pain with exercise. -Continue Isosorbide  as currently prescribed.  Disposition: Follow-up with Lonni Cash, MD or APP in 3 months   Signed, Wyn Raddle, Jackee Shove, NP 05/29/2023, 5:13 PM [facility]

## 2023-05-28 ENCOUNTER — Other Ambulatory Visit (HOSPITAL_BASED_OUTPATIENT_CLINIC_OR_DEPARTMENT_OTHER): Payer: Self-pay

## 2023-05-28 MED ORDER — BETAMETHASONE DIPROPIONATE 0.05 % EX LOTN: TOPICAL_LOTION | CUTANEOUS | 1 refills | Status: DC

## 2023-05-28 MED ORDER — PRAVASTATIN SODIUM 40 MG PO TABS: 40.0000 mg | ORAL_TABLET | Freq: Every day | ORAL | 1 refills | Status: DC

## 2023-05-28 MED ORDER — LABETALOL HCL 200 MG PO TABS: 200.0000 mg | ORAL_TABLET | Freq: Two times a day (BID) | ORAL | 1 refills | Status: DC

## 2023-05-28 MED ORDER — AMMONIUM LACTATE 12 % EX LOTN: TOPICAL_LOTION | CUTANEOUS | 3 refills | Status: DC

## 2023-05-28 MED ORDER — REPATHA SURECLICK 140 MG/ML ~~LOC~~ SOAJ: 140.0000 mg | SUBCUTANEOUS | 3 refills | Status: DC

## 2023-05-28 MED ORDER — AMLODIPINE BESYLATE 10 MG PO TABS: 10.0000 mg | ORAL_TABLET | Freq: Every day | ORAL | 4 refills | Status: DC

## 2023-05-28 MED ORDER — TRIAMCINOLONE ACETONIDE 0.1 % EX CREA: TOPICAL_CREAM | Freq: Two times a day (BID) | CUTANEOUS | 1 refills | Status: AC

## 2023-05-29 ENCOUNTER — Ambulatory Visit (INDEPENDENT_AMBULATORY_CARE_PROVIDER_SITE_OTHER): Payer: HMO

## 2023-05-29 ENCOUNTER — Ambulatory Visit: Payer: HMO | Attending: Nurse Practitioner | Admitting: Nurse Practitioner

## 2023-05-29 ENCOUNTER — Encounter: Payer: Self-pay | Admitting: Nurse Practitioner

## 2023-05-29 ENCOUNTER — Other Ambulatory Visit (HOSPITAL_BASED_OUTPATIENT_CLINIC_OR_DEPARTMENT_OTHER): Payer: Self-pay

## 2023-05-29 VITALS — BP 136/92 | HR 75 | Ht 70.0 in | Wt 156.6 lb

## 2023-05-29 DIAGNOSIS — I6523 Occlusion and stenosis of bilateral carotid arteries: Secondary | ICD-10-CM | POA: Diagnosis not present

## 2023-05-29 DIAGNOSIS — R5383 Other fatigue: Secondary | ICD-10-CM

## 2023-05-29 DIAGNOSIS — I4891 Unspecified atrial fibrillation: Secondary | ICD-10-CM

## 2023-05-29 DIAGNOSIS — R079 Chest pain, unspecified: Secondary | ICD-10-CM | POA: Diagnosis not present

## 2023-05-29 DIAGNOSIS — I10 Essential (primary) hypertension: Secondary | ICD-10-CM

## 2023-05-29 DIAGNOSIS — I25118 Atherosclerotic heart disease of native coronary artery with other forms of angina pectoris: Secondary | ICD-10-CM | POA: Diagnosis not present

## 2023-05-29 DIAGNOSIS — R0602 Shortness of breath: Secondary | ICD-10-CM | POA: Diagnosis not present

## 2023-05-29 DIAGNOSIS — I480 Paroxysmal atrial fibrillation: Secondary | ICD-10-CM | POA: Diagnosis not present

## 2023-05-29 MED ORDER — FLECAINIDE ACETATE 100 MG PO TABS
100.0000 mg | ORAL_TABLET | Freq: Two times a day (BID) | ORAL | 3 refills | Status: DC
  Filled 2023-05-29: qty 180, 90d supply, fill #0

## 2023-05-29 NOTE — Progress Notes (Unsigned)
Enrolled patient for a 14 day Zio XT monitor to be mailed to patients home  [place] to read

## 2023-05-29 NOTE — Patient Instructions (Signed)
 Medication Instructions:  Increase flecainide  to 100 mg twice daily *If you need a refill on your cardiac medications before your next appointment, please call your pharmacy*  Lab Work: None ordered If you have labs (blood work) drawn today and your tests are completely normal, you will receive your results only by: MyChart Message (if you have MyChart) OR A paper copy in the mail If you have any lab test that is abnormal or we need to change your treatment, we will call you to review the results.  Testing/Procedures: Your physician has requested that you have an echocardiogram. Echocardiography is a painless test that uses sound waves to create images of your heart. It provides your doctor with information about the size and shape of your heart and how well your heart's chambers and valves are working. This procedure takes approximately one hour. There are no restrictions for this procedure. Please do NOT wear cologne, perfume, aftershave, or lotions (deodorant is allowed). Please arrive 15 minutes prior to your appointment time.  Please note: We ask at that you not bring children with you during ultrasound (echo/ vascular) testing. Due to room size and safety concerns, children are not allowed in the ultrasound rooms during exams. Our front office staff cannot provide observation of children in our lobby area while testing is being conducted. An adult accompanying a patient to their appointment will only be allowed in the ultrasound room at the discretion of the ultrasound technician under special circumstances. We apologize for any inconvenience.   Your physician has requested that you have a carotid duplex. This test is an ultrasound of the carotid arteries in your neck. It looks at blood flow through these arteries that supply the brain with blood. Allow one hour for this exam. There are no restrictions or special instructions.   ZIO XT- Long Term Monitor Instructions  Your physician has  requested you wear a ZIO patch monitor for 14 days.  This is a single patch monitor. [employer] supplies one patch monitor per enrollment. Additional stickers are not available. Please do not apply patch if you will be having a Nuclear Stress Test,  Echocardiogram, Cardiac CT, MRI, or Chest Xray during the period you would be wearing the  monitor. The patch cannot be worn during these tests. You cannot remove and re-apply the  ZIO XT patch monitor.  Your ZIO patch monitor will be mailed 3 day USPS to your address on file. It may take 3-5 days  to receive your monitor after you have been enrolled.  Once you have received your monitor, please review the enclosed instructions. Your monitor  has already been registered assigning a specific monitor serial # to you.  Billing and Patient Assistance Program Information  We have supplied [employer] with any of your insurance information on file for billing purposes. [employer] offers a sliding scale Patient Assistance Program for patients that do not have  insurance, or whose insurance does not completely cover the cost of the ZIO monitor.  You must apply for the Patient Assistance Program to qualify for this discounted rate.  To apply, please call [employer] at [phone], select option 4, select option 2, ask to apply for  Patient Assistance Program. Meredeth will ask your household income, and how many people  are in your household. They will quote your out-of-pocket cost based on that information.  [employer] will also be able to set up a 83-month, interest-free payment plan if needed.  Applying the monitor   Shave hair from upper  left chest.  Hold abrader disc by orange tab. Rub abrader in 40 strokes over the upper left chest as  indicated in your monitor instructions.  Clean area with 4 enclosed alcohol pads. Let dry.  Apply patch as indicated in monitor instructions. Patch will be placed under collarbone on left  side of chest with arrow pointing  upward.  Rub patch adhesive wings for 2 minutes. Remove white label marked 1. Remove the white  label marked 2. Rub patch adhesive wings for 2 additional minutes.  While looking in a mirror, press and release button in center of patch. A small green light will  flash 3-4 times. This will be your only indicator that the monitor has been turned on.  Do not shower for the first 24 hours. You may shower after the first 24 hours.  Press the button if you feel a symptom. You will hear a small click. Record Date, Time and  Symptom in the Patient Logbook.  When you are ready to remove the patch, follow instructions on the last 2 pages of Patient  Logbook. Stick patch monitor onto the last page of Patient Logbook.  Place Patient Logbook in the blue and white box. Use locking tab on box and tape box closed  securely. The blue and white box has prepaid postage on it. Please place it in the mailbox as  soon as possible. Your physician should have your test results approximately 7 days after the  monitor has been mailed back to [facility].  Call [facility] Customer Care at [phone] if you have questions regarding  your ZIO XT patch monitor. Call them immediately if you see an orange light blinking on your  monitor.  If your monitor falls off in less than 4 days, contact our Monitor department at [phone].  If your monitor becomes loose or falls off after 4 days call [employer] at [phone] for  suggestions on securing your monitor   Follow-Up: At [facility], you and your health needs are our priority.  As part of our continuing mission to provide you with exceptional heart care, we have created designated Provider Care Teams.  These Care Teams include your primary Cardiologist (physician) and Advanced Practice Providers (APPs -  Physician Assistants and Nurse Practitioners) who all work together to provide you with the care you need, when you need it.  We recommend  signing up for the patient portal called MyChart.  Sign up information is provided on this After Visit Summary.  MyChart is used to connect with patients for Virtual Visits (Telemedicine).  Patients are able to view lab/test results, encounter notes, upcoming appointments, etc.  Non-urgent messages can be sent to your provider as well.   To learn more about what you can do with MyChart, go to [email or web address].    Your next appointment:   3 month(s)  Provider:   Lonni Cash, MD

## 2023-05-30 ENCOUNTER — Other Ambulatory Visit (HOSPITAL_BASED_OUTPATIENT_CLINIC_OR_DEPARTMENT_OTHER): Payer: Self-pay

## 2023-05-31 ENCOUNTER — Other Ambulatory Visit (HOSPITAL_BASED_OUTPATIENT_CLINIC_OR_DEPARTMENT_OTHER): Payer: Self-pay

## 2023-06-03 DIAGNOSIS — I4891 Unspecified atrial fibrillation: Secondary | ICD-10-CM | POA: Diagnosis not present

## 2023-06-06 DIAGNOSIS — Z9181 History of falling: Secondary | ICD-10-CM | POA: Diagnosis not present

## 2023-06-06 DIAGNOSIS — Z0000 Encounter for general adult medical examination without abnormal findings: Secondary | ICD-10-CM | POA: Diagnosis not present

## 2023-06-10 ENCOUNTER — Other Ambulatory Visit (HOSPITAL_BASED_OUTPATIENT_CLINIC_OR_DEPARTMENT_OTHER): Payer: Self-pay

## 2023-06-20 ENCOUNTER — Other Ambulatory Visit (HOSPITAL_BASED_OUTPATIENT_CLINIC_OR_DEPARTMENT_OTHER): Payer: Self-pay

## 2023-06-21 ENCOUNTER — Other Ambulatory Visit (HOSPITAL_BASED_OUTPATIENT_CLINIC_OR_DEPARTMENT_OTHER): Payer: Self-pay

## 2023-06-21 DIAGNOSIS — I10 Essential (primary) hypertension: Secondary | ICD-10-CM | POA: Diagnosis not present

## 2023-06-21 DIAGNOSIS — H539 Unspecified visual disturbance: Secondary | ICD-10-CM | POA: Diagnosis not present

## 2023-06-21 DIAGNOSIS — R972 Elevated prostate specific antigen [PSA]: Secondary | ICD-10-CM | POA: Diagnosis not present

## 2023-06-21 DIAGNOSIS — I2510 Atherosclerotic heart disease of native coronary artery without angina pectoris: Secondary | ICD-10-CM | POA: Diagnosis not present

## 2023-06-21 DIAGNOSIS — R7309 Other abnormal glucose: Secondary | ICD-10-CM | POA: Diagnosis not present

## 2023-06-21 DIAGNOSIS — Z6821 Body mass index (BMI) 21.0-21.9, adult: Secondary | ICD-10-CM | POA: Diagnosis not present

## 2023-06-21 DIAGNOSIS — Z2821 Immunization not carried out because of patient refusal: Secondary | ICD-10-CM | POA: Diagnosis not present

## 2023-06-21 DIAGNOSIS — N184 Chronic kidney disease, stage 4 (severe): Secondary | ICD-10-CM | POA: Diagnosis not present

## 2023-06-21 DIAGNOSIS — D539 Nutritional anemia, unspecified: Secondary | ICD-10-CM | POA: Diagnosis not present

## 2023-06-21 DIAGNOSIS — E785 Hyperlipidemia, unspecified: Secondary | ICD-10-CM | POA: Diagnosis not present

## 2023-06-21 DIAGNOSIS — J309 Allergic rhinitis, unspecified: Secondary | ICD-10-CM | POA: Diagnosis not present

## 2023-06-21 DIAGNOSIS — R42 Dizziness and giddiness: Secondary | ICD-10-CM | POA: Diagnosis not present

## 2023-06-21 MED ORDER — REPATHA SURECLICK 140 MG/ML ~~LOC~~ SOAJ
1.0000 mL | SUBCUTANEOUS | 3 refills | Status: AC
Start: 2023-06-21 — End: ?
  Filled 2023-06-21: qty 2, 28d supply, fill #0
  Filled 2023-08-23: qty 2, 28d supply, fill #1
  Filled 2023-10-11: qty 2, 28d supply, fill #2
  Filled 2023-11-29: qty 2, 28d supply, fill #3
  Filled 2024-03-09: qty 6, 84d supply, fill #4

## 2023-06-25 ENCOUNTER — Other Ambulatory Visit (HOSPITAL_BASED_OUTPATIENT_CLINIC_OR_DEPARTMENT_OTHER): Payer: Self-pay

## 2023-06-26 ENCOUNTER — Telehealth: Payer: Self-pay | Admitting: Cardiovascular Disease

## 2023-06-26 DIAGNOSIS — I4891 Unspecified atrial fibrillation: Secondary | ICD-10-CM | POA: Diagnosis not present

## 2023-06-26 NOTE — Telephone Encounter (Signed)
 Patient's wife is returning call in regard to heart monitor results. Patient's wife is requesting a call back and for us  to mail the results to them. Patient's wife is aware that they may not receive a call back until tomorrow 06/27/23. Please advise.

## 2023-06-26 NOTE — Telephone Encounter (Signed)
 Called pt's home number twice. Line was busy. Unable to leave voice message.

## 2023-06-27 NOTE — Telephone Encounter (Signed)
 Wife Jack [name]) returned RN's call regarding results.

## 2023-07-04 ENCOUNTER — Ambulatory Visit (HOSPITAL_COMMUNITY)
Admission: RE | Admit: 2023-07-04 | Discharge: 2023-07-04 | Disposition: A | Discharge status: Home / Self Care | Payer: HMO | Source: Ambulatory Visit | Attending: Nurse Practitioner | Admitting: Nurse Practitioner

## 2023-07-04 ENCOUNTER — Ambulatory Visit (HOSPITAL_BASED_OUTPATIENT_CLINIC_OR_DEPARTMENT_OTHER)
Admission: RE | Admit: 2023-07-04 | Discharge: 2023-07-04 | Disposition: A | Discharge status: Home / Self Care | Payer: HMO | Source: Ambulatory Visit | Attending: Nurse Practitioner | Admitting: Nurse Practitioner

## 2023-07-04 DIAGNOSIS — R0602 Shortness of breath: Secondary | ICD-10-CM | POA: Diagnosis not present

## 2023-07-04 DIAGNOSIS — I6523 Occlusion and stenosis of bilateral carotid arteries: Secondary | ICD-10-CM | POA: Diagnosis not present

## 2023-07-04 DIAGNOSIS — R5383 Other fatigue: Secondary | ICD-10-CM | POA: Diagnosis not present

## 2023-07-04 LAB — ECHOCARDIOGRAM COMPLETE
AR max vel: 2.2 cm2
AV Area VTI: 2.4 cm2
AV Area mean vel: 2.15 cm2
AV Mean grad: 2 mm[Hg]
AV Peak grad: 4.7 mm[Hg]
AV Vena cont: 0.23 cm
Ao pk vel: 1.08 m/s
Area-P 1/2: 3.08 cm2
MV M vel: 0.83 m/s
MV Peak grad: 2.8 mm[Hg]
P 1/2 time: 635 ms
S' Lateral: 3.19 cm

## 2023-07-05 ENCOUNTER — Telehealth: Payer: Self-pay

## 2023-07-05 DIAGNOSIS — I6523 Occlusion and stenosis of bilateral carotid arteries: Secondary | ICD-10-CM

## 2023-07-05 NOTE — Telephone Encounter (Signed)
-----   Message from Napoleon Form, Leodis Rains sent at 07/05/2023 11:50 AM EST ----- Please let patient know that his carotid results revealed 80-99% carotid stenosis which has worsened since his previous ultrasound 1 year ago.  I reached out to Dr. Clifton James and he is in agreement to refer patient to vascular surgery for consideration of carotid endarterectomy.  Please advise patient to continue his current medications as prescribed and please let me know if you have any further questions.  Robin Searing, NP

## 2023-07-05 NOTE — Telephone Encounter (Signed)
Patient's spouse Octavio Graves returned call. OK to speak with Octavio Graves per DPR, patient also on call.  Discussed carotid ultrasound results and recommendation from Robin Searing, NP.  Referral for vein and vascular surgeon placed.   Bonita and patient verbalized understanding of results.

## 2023-07-23 NOTE — Progress Notes (Unsigned)
 Office Note     CC: Right-sided critical ICA stenosis Requesting Provider:  Gaston Islam., NP  HPI: Jack [name] is a 78 y.o. (1946/05/19) male presenting at the request of .Moon, Amy A, NP with critical right-sided ICA stenosis.  On exam, but was doing well, accompanied by his wife.  A native of [place], he has lived in that community his entire life.  Now retired, he enjoys hanging out and 'putting around the house'.  Jack [name] denies symptoms of TIA, stroke, amaurosis.  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  States his current GFR is mid 30s. His mother has history of ICA stenosis status post CEA with massive postop stroke.  The pt is  on a statin for cholesterol management - and repatha The pt is  on a daily aspirin.   Other AC:  - The pt is  on medication for hypertension.   The pt is not diabetic.  Tobacco hx:  none  Past Medical History:  Diagnosis Date   Allergy    Arthritis    psoriatic arthritis    Asthma    Atheroembolic kidney disease (HCC)    Atheroembolism    CAD (coronary artery disease)    hx non obst   Cancer (HCC)    basal cell x3 , last one removed - 12/12/2013, R temple area of forehead   Chronic kidney disease    renal stones - tx. /w lithotripsy   Complication of anesthesia    GERD (gastroesophageal reflux disease)    HLD (hyperlipidemia)    HTN (hypertension)    Hypoglycemia    Paroxysmal atrial fibrillation (HCC)    PONV (postoperative nausea and vomiting)    Pruritus    Psoriasis    Shortness of breath    after eating , if he gets active, he might have some SOB   Vitamin D deficiency     Past Surgical History:  Procedure Laterality Date   AV FISTULA PLACEMENT  07-2009   Left Brachiocephalic AVF   CARDIAC CATHETERIZATION  2010   COLONOSCOPY  09/15/2012   Colonic polyp status post polypectomy. Mild sigmoid diverticulosis. Small internal hemorrhoids. Otherwise normal colonoscopy     LIGATION OF ARTERIOVENOUS  FISTULA Left 12/30/2013   Procedure: EXCISION OF LEFT ARM ARTERIOVENOUS  FISTULA;  Surgeon: Larina Earthly, MD;  Location: [facility] OR;  Service: Vascular;  Laterality: Left;   LITHOTRIPSY     skin cancer removed     multiple basal cell    Social History   Socioeconomic History   Marital status: Married    Spouse name: Not on file   Number of children: 0   Years of education: Not on file   Highest education level: Not on file  Occupational History   Occupation: Retired from a Education officer, environmental  Tobacco Use   Smoking status: Former    Current packs/day: 0.00    Types: Cigarettes    Quit date: 05/21/1970    Years since quitting: 53.2   Smokeless tobacco: Never   Tobacco comments:    quit 40+ years ago   Vaping Use   Vaping status: Never Used  Substance and Sexual Activity   Alcohol use: No   Drug use: No   Sexual activity: Not on file  Other Topics Concern   Not on file  Social History Narrative   Married, lives with wife; Designer, television/film set.   Lives in [place]  Social Drivers of Corporate investment banker Strain: Not on file  Food Insecurity: Not on file  Transportation Needs: Not on file  Physical Activity: Not on file  Stress: Not on file  Social Connections: Not on file  Intimate Partner Violence: Not on file   Family History  Problem Relation Age of Onset   Cancer Mother    Heart disease Father        Before age 66   Asthma Father    Heart disease Brother        After 16 years of age   Coronary artery disease Other        family hx   Colon cancer Neg Hx    Esophageal cancer Neg Hx    Liver cancer Neg Hx    Pancreatic cancer Neg Hx    Rectal cancer Neg Hx    Stomach cancer Neg Hx     Current Outpatient Medications  Medication Sig Dispense Refill   amLODipine (NORVASC) 5 MG tablet Take 5 mg by mouth at bedtime. Pt takes 1 tablet once at night.     ascorbic acid (VITAMIN C) 500 MG tablet Take 500 mg by mouth daily.      aspirin EC 81 MG tablet Take 81 mg by mouth daily. Swallow whole.     betamethasone dipropionate (DIPROLENE) 0.05 % cream Apply 1 application topically 2 (two) times daily as needed (for psoriasis).     Cholecalciferol (VITAMIN D-3) 125 MCG (5000 UT) TABS Take 1 tablet by mouth daily.     Evolocumab (REPATHA SURECLICK) 140 MG/ML SOAJ Inject 140 mg into the skin every 14 (fourteen) days. 6 mL 3   Evolocumab (REPATHA) 140 MG/ML SOSY Inject 140 mg into the skin every 21 ( twenty-one) days.     flecainide (TAMBOCOR) 100 MG tablet Take 1 tablet (100 mg total) by mouth 2 (two) times daily. 180 tablet 3   isosorbide mononitrate (IMDUR) 30 MG 24 hr tablet Take 1 tablet (30 mg total) by mouth daily. 90 tablet 3   labetalol (NORMODYNE) 100 MG tablet Take 100 mg by mouth 2 (two) times daily.     loratadine (CLARITIN) 10 MG tablet Take 10 mg by mouth daily.     nitroGLYCERIN (NITROSTAT) 0.4 MG SL tablet Place 1 tablet (0.4 mg total) under the tongue every 5 (five) minutes as needed for chest pain. 25 tablet 3   Omega-3 Fatty Acids (FISH OIL) 1000 MG CAPS Take 1 capsule by mouth daily.     OVER THE COUNTER MEDICATION Co Q 10, 100 mg daily.     pravastatin (PRAVACHOL) 40 MG tablet Take 40 mg by mouth daily.      Triamcinolone Acetonide (KENALOG IJ) Inject 1 application as directed as directed. 3-4 months     triamcinolone cream (KENALOG) 0.1 % Apply topically 2 (two) times daily. 454 g 1   No current facility-administered medications for this visit.    Allergies  Allergen Reactions   Hydrocodone Nausea Only   Oxycodone Nausea And Vomiting   Eliquis [Apixaban]     Itching and rash   Hydrochlorothiazide     anxious   Hytrin [Terazosin Hcl]     Heart races  Pt's wife unfamiliar (01/11/23)   Pradaxa [Dabigatran Etexilate Mesylate]     Itching and rash    Sulfa Antibiotics Itching   Xarelto [Rivaroxaban]     Itching and rash      REVIEW OF SYSTEMS:  [X]  denotes positive finding, [ ]   denotes  negative finding Cardiac  Comments:  Chest pain or chest pressure:    Shortness of breath upon exertion:    Short of breath when lying flat:    Irregular heart rhythm:        Vascular    Pain in calf, thigh, or hip brought on by ambulation:    Pain in feet at night that wakes you up from your sleep:     Blood clot in your veins:    Leg swelling:         Pulmonary    Oxygen at home:    Productive cough:     Wheezing:         Neurologic    Sudden weakness in arms or legs:     Sudden numbness in arms or legs:     Sudden onset of difficulty speaking or slurred speech:    Temporary loss of vision in one eye:     Problems with dizziness:         Gastrointestinal    Blood in stool:     Vomited blood:         Genitourinary    Burning when urinating:     Blood in urine:        Psychiatric    Major depression:         Hematologic    Bleeding problems:    Problems with blood clotting too easily:        Skin    Rashes or ulcers:        Constitutional    Fever or chills:      PHYSICAL EXAMINATION:  There were no vitals filed for this visit.  General:  WDWN in NAD; vital signs documented above Gait: Not observed HENT: WNL, normocephalic Pulmonary: normal non-labored breathing , without wheezing Cardiac: regular HR Abdomen: soft, NT, no masses Skin: without rashes Vascular Exam/Pulses:  Right Left  Radial 2+ (normal) 2+ (normal)                       Extremities: without ischemic changes, without Gangrene , without cellulitis; without open wounds;  Musculoskeletal: no muscle wasting or atrophy  Neurologic: A&O X 3;  No focal weakness or paresthesias are detected Psychiatric:  The pt has Normal affect.   Non-Invasive Vascular Imaging:   Right Carotid Findings:  +----------+--------+--------+--------+-------------------+----------------  ----+           PSV cm/sEDV cm/sStenosisPlaque Description Comments                +----------+--------+--------+--------+-------------------+----------------  ----+  CCA Prox  55      9                                                         +----------+--------+--------+--------+-------------------+----------------  ----+  CCA Distal46      9               heterogenous and                                                            calcific                                  +----------+--------+--------+--------+-------------------+----------------  ----+  ICA Prox  477     167     80-99%  heterogenous and                                                            irregular                                 +----------+--------+--------+--------+-------------------+----------------  ----+  ICA Mid   296     71                                                        +----------+--------+--------+--------+-------------------+----------------  ----+  ICA Distal168     38                                 post-stenotic                                                               turbulence             +----------+--------+--------+--------+-------------------+----------------  ----+  ECA      96      9                                                         +----------+--------+--------+--------+-------------------+----------------  ----+   +----------+--------+-------+---------+-------------------+           PSV cm/sEDV cmsDescribe Arm Pressure (mmHG)  +----------+--------+-------+---------+-------------------+  [id number]           Turbulent                     +----------+--------+-------+---------+-------------------+   +---------+--------+--+--------+--+---------+  VertebralPSV cm/s48EDV cm/s14Antegrade  +---------+--------+--+--------+--+---------+      Left Carotid Findings:  +----------+--------+-------+--------+--------------------------------+----  ----+            PSV cm/sEDV    StenosisPlaque Description               Comments                   cm/s                                                      +----------+--------+-------+--------+--------------------------------+----  ----+  CCA Prox  58      9  tortuous  +----------+--------+-------+--------+--------------------------------+----  ----+  CCA Distal65      15                                                        +----------+--------+-------+--------+--------------------------------+----  ----+  ICA Prox  112     37     1-39%   smooth, heterogenous and                                                    calcific                                   +----------+--------+-------+--------+--------------------------------+----  ----+  ICA Mid   96      27                                                        +----------+--------+-------+--------+--------------------------------+----  ----+  ICA Distal59      16                                                        +----------+--------+-------+--------+--------------------------------+----  ----+  ECA      114     11             heterogenous and calcific                  +----------+--------+-------+--------+--------------------------------+----  ----+   +----------+--------+--------+---------+-------------------+           PSV cm/sEDV cm/sDescribe Arm Pressure (mmHG)  +----------+--------+--------+---------+-------------------+  [id number]            Turbulent                     +----------+--------+--------+---------+-------------------+   +---------+--------+--+--------+--+---------+  VertebralPSV cm/s37EDV cm/s12Antegrade  +---------+--------+--+--------+--+---------+         ASSESSMENT/PLAN: MANDELA BELLO is a 78 y.o. male presenting with right-sided, asymptomatic, critical ICA  stenosis.  Pertinent I had a long discussion regarding the above.  We discussed that with critical ICA stenosis, he has an 11% risk of stroke over the next 5 years.  With intervention, this is decreased to 5%.  He is healthy, and should have no problem living another 5 years.  We discussed that the next step would be CT angiogram head and neck in an effort to define the right sided ICA lesion.  This will allow for preoperative planning and surgical candidacy discussion, namely carotid endarterectomy versus transcarotid artery revascularization. I am aware that this could be declined by radiology due to his CKD.  I do not have access to recent lab work which was performed at [facility].  I will allow radiology to make this call.  Jack [name] is aware that should radiology decline CT imaging, the only  modality of carotid treatment would be carotid endarterectomy.  If this is the case, I will accompany him to our ultrasound lab to assess the atherosclerotic lesion and ensure that it is accessible.    My plan is to see Jack [name] in the coming weeks for surgical discussion.    Jack Sparrow, MD [facility] [phone]

## 2023-07-25 ENCOUNTER — Encounter: Payer: Self-pay | Admitting: Vascular Surgery

## 2023-07-25 ENCOUNTER — Ambulatory Visit: Payer: HMO | Admitting: Vascular Surgery

## 2023-07-25 VITALS — BP 147/87 | HR 61 | Temp 97.9°F | Ht 70.0 in | Wt 157.0 lb

## 2023-07-25 DIAGNOSIS — I6521 Occlusion and stenosis of right carotid artery: Secondary | ICD-10-CM

## 2023-07-26 DIAGNOSIS — I10 Essential (primary) hypertension: Secondary | ICD-10-CM | POA: Diagnosis not present

## 2023-07-26 DIAGNOSIS — I6523 Occlusion and stenosis of bilateral carotid arteries: Secondary | ICD-10-CM | POA: Diagnosis not present

## 2023-07-26 DIAGNOSIS — Z713 Dietary counseling and surveillance: Secondary | ICD-10-CM | POA: Diagnosis not present

## 2023-07-26 DIAGNOSIS — R42 Dizziness and giddiness: Secondary | ICD-10-CM | POA: Diagnosis not present

## 2023-07-26 DIAGNOSIS — N184 Chronic kidney disease, stage 4 (severe): Secondary | ICD-10-CM | POA: Diagnosis not present

## 2023-07-26 DIAGNOSIS — R7309 Other abnormal glucose: Secondary | ICD-10-CM | POA: Diagnosis not present

## 2023-07-26 DIAGNOSIS — I480 Paroxysmal atrial fibrillation: Secondary | ICD-10-CM | POA: Diagnosis not present

## 2023-07-27 DIAGNOSIS — D485 Neoplasm of uncertain behavior of skin: Secondary | ICD-10-CM | POA: Diagnosis not present

## 2023-07-27 DIAGNOSIS — L820 Inflamed seborrheic keratosis: Secondary | ICD-10-CM | POA: Diagnosis not present

## 2023-07-27 DIAGNOSIS — D2239 Melanocytic nevi of other parts of face: Secondary | ICD-10-CM | POA: Diagnosis not present

## 2023-07-29 ENCOUNTER — Other Ambulatory Visit: Payer: Self-pay

## 2023-07-29 DIAGNOSIS — I6521 Occlusion and stenosis of right carotid artery: Secondary | ICD-10-CM

## 2023-08-01 ENCOUNTER — Telehealth: Payer: Self-pay

## 2023-08-01 NOTE — Telephone Encounter (Signed)
 Appointment: -pt and spouse called stating Jack [name] can not have contrast d/t his kidneys so they are inquiring about setting up the [place] .   -reviewed notes and confirmed with MD it will be performed in conjunction with his appt. -Wife had a number of questions and suggested making a list of their questions and bring them to the appt so they can be addressed.

## 2023-08-07 NOTE — Telephone Encounter (Signed)
 Duplicate

## 2023-08-09 DIAGNOSIS — C44311 Basal cell carcinoma of skin of nose: Secondary | ICD-10-CM | POA: Diagnosis not present

## 2023-08-13 DIAGNOSIS — E559 Vitamin D deficiency, unspecified: Secondary | ICD-10-CM | POA: Diagnosis not present

## 2023-08-13 DIAGNOSIS — D539 Nutritional anemia, unspecified: Secondary | ICD-10-CM | POA: Diagnosis not present

## 2023-08-13 DIAGNOSIS — I480 Paroxysmal atrial fibrillation: Secondary | ICD-10-CM | POA: Diagnosis not present

## 2023-08-13 DIAGNOSIS — R531 Weakness: Secondary | ICD-10-CM | POA: Diagnosis not present

## 2023-08-13 DIAGNOSIS — R972 Elevated prostate specific antigen [PSA]: Secondary | ICD-10-CM | POA: Diagnosis not present

## 2023-08-13 DIAGNOSIS — E538 Deficiency of other specified B group vitamins: Secondary | ICD-10-CM | POA: Diagnosis not present

## 2023-08-14 NOTE — Progress Notes (Deleted)
 Office Note     CC: Right-sided critical ICA stenosis Requesting Provider:  Hurshel Party, NP  HPI: Jack [name] is a 78 y.o. ([birth year]) male presenting at the request of .Moon, Amy A, NP with critical right-sided ICA stenosis.  On exam, but was doing well, accompanied by his wife.  A native of [place], he has lived in that community his entire life.  Now retired, he enjoys hanging out and 'putting around the house'.  Mort denies symptoms of TIA, stroke, amaurosis.  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  States his current GFR is mid 30s. His mother has history of ICA stenosis status post CEA with massive postop stroke.  The pt is  on a statin for cholesterol management - and repatha The pt is  on a daily aspirin.   Other AC:  - The pt is  on medication for hypertension.   The pt is not diabetic.  Tobacco hx:  none  Past Medical History:  Diagnosis Date   Allergy    Arthritis    psoriatic arthritis    Asthma    Atheroembolic kidney disease (HCC)    Atheroembolism    CAD (coronary artery disease)    hx non obst   Cancer (HCC)    basal cell x3 , last one removed - 12/12/2013, R temple area of forehead   Chronic kidney disease    renal stones - tx. /w lithotripsy   Complication of anesthesia    GERD (gastroesophageal reflux disease)    HLD (hyperlipidemia)    HTN (hypertension)    Hypoglycemia    Paroxysmal atrial fibrillation (HCC)    PONV (postoperative nausea and vomiting)    Pruritus    Psoriasis    Shortness of breath    after eating , if he gets active, he might have some SOB   Vitamin D deficiency     Past Surgical History:  Procedure Laterality Date   AV FISTULA PLACEMENT  07-2009   Left Brachiocephalic AVF   CARDIAC CATHETERIZATION  2010   COLONOSCOPY  09/15/2012   Colonic polyp status post polypectomy. Mild sigmoid diverticulosis. Small internal hemorrhoids. Otherwise normal colonoscopy     LIGATION OF ARTERIOVENOUS  FISTULA Left 12/30/2013   Procedure: EXCISION OF LEFT ARM ARTERIOVENOUS  FISTULA;  Surgeon: Larina Earthly, MD;  Location: [facility] OR;  Service: Vascular;  Laterality: Left;   LITHOTRIPSY     skin cancer removed     multiple basal cell    Social History   Socioeconomic History   Marital status: Married    Spouse name: Not on file   Number of children: 0   Years of education: Not on file   Highest education level: Not on file  Occupational History   Occupation: Retired from a Education officer, environmental  Tobacco Use   Smoking status: Former    Current packs/day: 0.00    Types: Cigarettes    Quit date: 05/21/1970    Years since quitting: 53.2   Smokeless tobacco: Never   Tobacco comments:    quit 40+ years ago   Vaping Use   Vaping status: Never Used  Substance and Sexual Activity   Alcohol use: No   Drug use: No   Sexual activity: Not on file  Other Topics Concern   Not on file  Social History Narrative   Married, lives with wife; Designer, television/film set.   L[facility] in [facility]   Social  Drivers of Corporate investment banker Strain: Not on file  Food Insecurity: Not on file  Transportation Needs: Not on file  Physical Activity: Not on file  Stress: Not on file  Social Connections: Not on file  Intimate Partner Violence: Not on file   Family History  Problem Relation Age of Onset   Cancer Mother    Heart disease Father        Before age 63   Asthma Father    Heart disease Brother        After 11 years of age   Coronary artery disease Other        family hx   Colon cancer Neg Hx    Esophageal cancer Neg Hx    Liver cancer Neg Hx    Pancreatic cancer Neg Hx    Rectal cancer Neg Hx    Stomach cancer Neg Hx     Current Outpatient Medications  Medication Sig Dispense Refill   amLODipine (NORVASC) 5 MG tablet Take 5 mg by mouth at bedtime. Pt takes 1 tablet once at night.     ascorbic acid (VITAMIN C) 500 MG tablet Take 500 mg by mouth daily.      aspirin EC 81 MG tablet Take 81 mg by mouth daily. Swallow whole.     betamethasone dipropionate (DIPROLENE) 0.05 % cream Apply 1 application topically 2 (two) times daily as needed (for psoriasis).     Cholecalciferol (VITAMIN D-3) 125 MCG (5000 UT) TABS Take 1 tablet by mouth daily.     Evolocumab (REPATHA SURECLICK) 140 MG/ML SOAJ Inject 140 mg into the skin every 14 (fourteen) days. 6 mL 3   Evolocumab (REPATHA) 140 MG/ML SOSY Inject 140 mg into the skin every 21 ( twenty-one) days.     flecainide (TAMBOCOR) 100 MG tablet Take 1 tablet (100 mg total) by mouth 2 (two) times daily. (Patient taking differently: Take 50 mg by mouth 2 (two) times daily.) 180 tablet 3   isosorbide mononitrate (IMDUR) 30 MG 24 hr tablet Take 1 tablet (30 mg total) by mouth daily. 90 tablet 3   labetalol (NORMODYNE) 100 MG tablet Take 100 mg by mouth 2 (two) times daily.     loratadine (CLARITIN) 10 MG tablet Take 10 mg by mouth daily.     nitroGLYCERIN (NITROSTAT) 0.4 MG SL tablet Place 1 tablet (0.4 mg total) under the tongue every 5 (five) minutes as needed for chest pain. 25 tablet 3   Omega-3 Fatty Acids (FISH OIL) 1000 MG CAPS Take 1 capsule by mouth daily.     OVER THE COUNTER MEDICATION Co Q 10, 100 mg daily.     pravastatin (PRAVACHOL) 40 MG tablet Take 40 mg by mouth daily.      Triamcinolone Acetonide (KENALOG IJ) Inject 1 application as directed as directed. 3-4 months     triamcinolone cream (KENALOG) 0.1 % Apply topically 2 (two) times daily. 454 g 1   No current facility-administered medications for this visit.    Allergies  Allergen Reactions   Hydrocodone Nausea Only   Oxycodone Nausea And Vomiting   Eliquis [Apixaban]     Itching and rash   Hydrochlorothiazide     anxious   Hytrin [Terazosin Hcl]     Heart races  Pt's wife unfamiliar (01/11/23)   Pradaxa [Dabigatran Etexilate Mesylate]     Itching and rash    Sulfa Antibiotics Itching   Xarelto [Rivaroxaban]     Itching and rash  REVIEW OF SYSTEMS:  [X]  denotes positive finding, [ ]  denotes negative finding Cardiac  Comments:  Chest pain or chest pressure:    Shortness of breath upon exertion:    Short of breath when lying flat:    Irregular heart rhythm:        Vascular    Pain in calf, thigh, or hip brought on by ambulation:    Pain in feet at night that wakes you up from your sleep:     Blood clot in your veins:    Leg swelling:         Pulmonary    Oxygen at home:    Productive cough:     Wheezing:         Neurologic    Sudden weakness in arms or legs:     Sudden numbness in arms or legs:     Sudden onset of difficulty speaking or slurred speech:    Temporary loss of vision in one eye:     Problems with dizziness:         Gastrointestinal    Blood in stool:     Vomited blood:         Genitourinary    Burning when urinating:     Blood in urine:        Psychiatric    Major depression:         Hematologic    Bleeding problems:    Problems with blood clotting too easily:        Skin    Rashes or ulcers:        Constitutional    Fever or chills:      PHYSICAL EXAMINATION:  There were no vitals filed for this visit.  General:  WDWN in NAD; vital signs documented above Gait: Not observed HENT: WNL, normocephalic Pulmonary: normal non-labored breathing , without wheezing Cardiac: regular HR Abdomen: soft, NT, no masses Skin: without rashes Vascular Exam/Pulses:  Right Left  Radial 2+ (normal) 2+ (normal)                       Extremities: without ischemic changes, without Gangrene , without cellulitis; without open wounds;  Musculoskeletal: no muscle wasting or atrophy  Neurologic: A&O X 3;  No focal weakness or paresthesias are detected Psychiatric:  The pt has Normal affect.   Non-Invasive Vascular Imaging:   Right Carotid Findings:  +----------+--------+--------+--------+-------------------+----------------  ----+           PSV cm/sEDV  cm/sStenosisPlaque Description Comments               +----------+--------+--------+--------+-------------------+----------------  ----+  CCA Prox  55      9                                                         +----------+--------+--------+--------+-------------------+----------------  ----+  CCA Distal46      9               heterogenous and  calcific                                  +----------+--------+--------+--------+-------------------+----------------  ----+  ICA Prox  477     167     80-99%  heterogenous and                                                            irregular                                 +----------+--------+--------+--------+-------------------+----------------  ----+  ICA Mid   296     71                                                        +----------+--------+--------+--------+-------------------+----------------  ----+  ICA Distal168     38                                 post-stenotic                                                               turbulence             +----------+--------+--------+--------+-------------------+----------------  ----+  ECA      96      9                                                         +----------+--------+--------+--------+-------------------+----------------  ----+   +----------+--------+-------+---------+-------------------+           PSV cm/sEDV cmsDescribe Arm Pressure (mmHG)  +----------+--------+-------+---------+-------------------+  [id number]           Turbulent                     +----------+--------+-------+---------+-------------------+   +---------+--------+--+--------+--+---------+  VertebralPSV cm/s48EDV cm/s14Antegrade  +---------+--------+--+--------+--+---------+      Left Carotid Findings:   +----------+--------+-------+--------+--------------------------------+----  ----+           PSV cm/sEDV    StenosisPlaque Description               Comments                   cm/s                                                      +----------+--------+-------+--------+--------------------------------+----  ----+  CCA Prox  58  9                                               tortuous  +----------+--------+-------+--------+--------------------------------+----  ----+  CCA Distal65      15                                                        +----------+--------+-------+--------+--------------------------------+----  ----+  ICA Prox  112     37     1-39%   smooth, heterogenous and                                                    calcific                                   +----------+--------+-------+--------+--------------------------------+----  ----+  ICA Mid   96      27                                                        +----------+--------+-------+--------+--------------------------------+----  ----+  ICA Distal59      16                                                        +----------+--------+-------+--------+--------------------------------+----  ----+  ECA      114     11             heterogenous and calcific                  +----------+--------+-------+--------+--------------------------------+----  ----+   +----------+--------+--------+---------+-------------------+           PSV cm/sEDV cm/sDescribe Arm Pressure (mmHG)  +----------+--------+--------+---------+-------------------+  [id number]            Turbulent                     +----------+--------+--------+---------+-------------------+   +---------+--------+--+--------+--+---------+  VertebralPSV cm/s37EDV cm/s12Antegrade  +---------+--------+--+--------+--+---------+         ASSESSMENT/PLAN: TRINITY HYLAND is a 78 y.o. male presenting with right-sided, asymptomatic, critical ICA stenosis.  Pertinent I had a long discussion regarding the above.  We discussed that with critical ICA stenosis, he has an 11% risk of stroke over the next 5 years.  With intervention, this is decreased to 5%.  He is healthy, and should have no problem living another 5 years.  We discussed that the next step would be CT angiogram head and neck in an effort to define the right sided ICA lesion.  This will allow for preoperative planning and surgical candidacy discussion, namely carotid endarterectomy versus transcarotid artery revascularization. I am aware that  this could be declined by radiology due to his CKD.  I do not have access to recent lab work which was performed at [facility].  I will allow radiology to make this call.  Jack [name] is aware that should radiology decline CT imaging, the only modality of carotid treatment would be carotid endarterectomy.  If this is the case, I will accompany him to our ultrasound lab to assess the atherosclerotic lesion and ensure that it is accessible.    My plan is to see Jack [name] in the coming weeks for surgical discussion.    Victorino Sparrow, MD [facility] [phone]

## 2023-08-14 NOTE — Progress Notes (Unsigned)
 Office Note     CC: Right-sided critical ICA stenosis Requesting Provider:  Hurshel Party, NP  HPI: Jack [name] is a 78 y.o. ([birth year]) male presenting at the request of .Moon, Amy A, NP with critical right-sided ICA stenosis.  On exam, but was doing well, accompanied by his wife.  A native of [place], he has lived in that community his entire life.  Now retired, he enjoys hanging out and 'putting around the house'.  Mort denies symptoms of TIA, stroke, amaurosis.  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  States his current GFR is mid 30s. His mother has history of ICA stenosis status post CEA with massive postop stroke.  The pt is  on a statin for cholesterol management - and repatha The pt is  on a daily aspirin.   Other AC:  - The pt is  on medication for hypertension.   The pt is not diabetic.  Tobacco hx:  none  Past Medical History:  Diagnosis Date   Allergy    Arthritis    psoriatic arthritis    Asthma    Atheroembolic kidney disease (HCC)    Atheroembolism    CAD (coronary artery disease)    hx non obst   Cancer (HCC)    basal cell x3 , last one removed - 12/12/2013, R temple area of forehead   Chronic kidney disease    renal stones - tx. /w lithotripsy   Complication of anesthesia    GERD (gastroesophageal reflux disease)    HLD (hyperlipidemia)    HTN (hypertension)    Hypoglycemia    Paroxysmal atrial fibrillation (HCC)    PONV (postoperative nausea and vomiting)    Pruritus    Psoriasis    Shortness of breath    after eating , if he gets active, he might have some SOB   Vitamin D deficiency     Past Surgical History:  Procedure Laterality Date   AV FISTULA PLACEMENT  07-2009   Left Brachiocephalic AVF   CARDIAC CATHETERIZATION  2010   COLONOSCOPY  09/15/2012   Colonic polyp status post polypectomy. Mild sigmoid diverticulosis. Small internal hemorrhoids. Otherwise normal colonoscopy     LIGATION OF ARTERIOVENOUS  FISTULA Left 12/30/2013   Procedure: EXCISION OF LEFT ARM ARTERIOVENOUS  FISTULA;  Surgeon: Larina Earthly, MD;  Location: [facility] OR;  Service: Vascular;  Laterality: Left;   LITHOTRIPSY     skin cancer removed     multiple basal cell    Social History   Socioeconomic History   Marital status: Married    Spouse name: Not on file   Number of children: 0   Years of education: Not on file   Highest education level: Not on file  Occupational History   Occupation: Retired from a Education officer, environmental  Tobacco Use   Smoking status: Former    Current packs/day: 0.00    Types: Cigarettes    Quit date: 05/21/1970    Years since quitting: 53.2   Smokeless tobacco: Never   Tobacco comments:    quit 40+ years ago   Vaping Use   Vaping status: Never Used  Substance and Sexual Activity   Alcohol use: No   Drug use: No   Sexual activity: Not on file  Other Topics Concern   Not on file  Social History Narrative   Married, lives with wife; Designer, television/film set.   L[facility] in [facility]   Social  Drivers of Corporate investment banker Strain: Not on file  Food Insecurity: Not on file  Transportation Needs: Not on file  Physical Activity: Not on file  Stress: Not on file  Social Connections: Not on file  Intimate Partner Violence: Not on file   Family History  Problem Relation Age of Onset   Cancer Mother    Heart disease Father        Before age 63   Asthma Father    Heart disease Brother        After 11 years of age   Coronary artery disease Other        family hx   Colon cancer Neg Hx    Esophageal cancer Neg Hx    Liver cancer Neg Hx    Pancreatic cancer Neg Hx    Rectal cancer Neg Hx    Stomach cancer Neg Hx     Current Outpatient Medications  Medication Sig Dispense Refill   amLODipine (NORVASC) 5 MG tablet Take 5 mg by mouth at bedtime. Pt takes 1 tablet once at night.     ascorbic acid (VITAMIN C) 500 MG tablet Take 500 mg by mouth daily.      aspirin EC 81 MG tablet Take 81 mg by mouth daily. Swallow whole.     betamethasone dipropionate (DIPROLENE) 0.05 % cream Apply 1 application topically 2 (two) times daily as needed (for psoriasis).     Cholecalciferol (VITAMIN D-3) 125 MCG (5000 UT) TABS Take 1 tablet by mouth daily.     Evolocumab (REPATHA SURECLICK) 140 MG/ML SOAJ Inject 140 mg into the skin every 14 (fourteen) days. 6 mL 3   Evolocumab (REPATHA) 140 MG/ML SOSY Inject 140 mg into the skin every 21 ( twenty-one) days.     flecainide (TAMBOCOR) 100 MG tablet Take 1 tablet (100 mg total) by mouth 2 (two) times daily. (Patient taking differently: Take 50 mg by mouth 2 (two) times daily.) 180 tablet 3   isosorbide mononitrate (IMDUR) 30 MG 24 hr tablet Take 1 tablet (30 mg total) by mouth daily. 90 tablet 3   labetalol (NORMODYNE) 100 MG tablet Take 100 mg by mouth 2 (two) times daily.     loratadine (CLARITIN) 10 MG tablet Take 10 mg by mouth daily.     nitroGLYCERIN (NITROSTAT) 0.4 MG SL tablet Place 1 tablet (0.4 mg total) under the tongue every 5 (five) minutes as needed for chest pain. 25 tablet 3   Omega-3 Fatty Acids (FISH OIL) 1000 MG CAPS Take 1 capsule by mouth daily.     OVER THE COUNTER MEDICATION Co Q 10, 100 mg daily.     pravastatin (PRAVACHOL) 40 MG tablet Take 40 mg by mouth daily.      Triamcinolone Acetonide (KENALOG IJ) Inject 1 application as directed as directed. 3-4 months     triamcinolone cream (KENALOG) 0.1 % Apply topically 2 (two) times daily. 454 g 1   No current facility-administered medications for this visit.    Allergies  Allergen Reactions   Hydrocodone Nausea Only   Oxycodone Nausea And Vomiting   Eliquis [Apixaban]     Itching and rash   Hydrochlorothiazide     anxious   Hytrin [Terazosin Hcl]     Heart races  Pt's wife unfamiliar (01/11/23)   Pradaxa [Dabigatran Etexilate Mesylate]     Itching and rash    Sulfa Antibiotics Itching   Xarelto [Rivaroxaban]     Itching and rash  REVIEW OF SYSTEMS:  [X]  denotes positive finding, [ ]  denotes negative finding Cardiac  Comments:  Chest pain or chest pressure:    Shortness of breath upon exertion:    Short of breath when lying flat:    Irregular heart rhythm:        Vascular    Pain in calf, thigh, or hip brought on by ambulation:    Pain in feet at night that wakes you up from your sleep:     Blood clot in your veins:    Leg swelling:         Pulmonary    Oxygen at home:    Productive cough:     Wheezing:         Neurologic    Sudden weakness in arms or legs:     Sudden numbness in arms or legs:     Sudden onset of difficulty speaking or slurred speech:    Temporary loss of vision in one eye:     Problems with dizziness:         Gastrointestinal    Blood in stool:     Vomited blood:         Genitourinary    Burning when urinating:     Blood in urine:        Psychiatric    Major depression:         Hematologic    Bleeding problems:    Problems with blood clotting too easily:        Skin    Rashes or ulcers:        Constitutional    Fever or chills:      PHYSICAL EXAMINATION:  There were no vitals filed for this visit.  General:  WDWN in NAD; vital signs documented above Gait: Not observed HENT: WNL, normocephalic Pulmonary: normal non-labored breathing , without wheezing Cardiac: regular HR Abdomen: soft, NT, no masses Skin: without rashes Vascular Exam/Pulses:  Right Left  Radial 2+ (normal) 2+ (normal)                       Extremities: without ischemic changes, without Gangrene , without cellulitis; without open wounds;  Musculoskeletal: no muscle wasting or atrophy  Neurologic: A&O X 3;  No focal weakness or paresthesias are detected Psychiatric:  The pt has Normal affect.   Non-Invasive Vascular Imaging:   Right Carotid Findings:  +----------+--------+--------+--------+-------------------+----------------  ----+           PSV cm/sEDV  cm/sStenosisPlaque Description Comments               +----------+--------+--------+--------+-------------------+----------------  ----+  CCA Prox  55      9                                                         +----------+--------+--------+--------+-------------------+----------------  ----+  CCA Distal46      9               heterogenous and  calcific                                  +----------+--------+--------+--------+-------------------+----------------  ----+  ICA Prox  477     167     80-99%  heterogenous and                                                            irregular                                 +----------+--------+--------+--------+-------------------+----------------  ----+  ICA Mid   296     71                                                        +----------+--------+--------+--------+-------------------+----------------  ----+  ICA Distal168     38                                 post-stenotic                                                               turbulence             +----------+--------+--------+--------+-------------------+----------------  ----+  ECA      96      9                                                         +----------+--------+--------+--------+-------------------+----------------  ----+   +----------+--------+-------+---------+-------------------+           PSV cm/sEDV cmsDescribe Arm Pressure (mmHG)  +----------+--------+-------+---------+-------------------+  [id number]           Turbulent                     +----------+--------+-------+---------+-------------------+   +---------+--------+--+--------+--+---------+  VertebralPSV cm/s48EDV cm/s14Antegrade  +---------+--------+--+--------+--+---------+      Left Carotid Findings:   +----------+--------+-------+--------+--------------------------------+----  ----+           PSV cm/sEDV    StenosisPlaque Description               Comments                   cm/s                                                      +----------+--------+-------+--------+--------------------------------+----  ----+  CCA Prox  58  9                                               tortuous  +----------+--------+-------+--------+--------------------------------+----  ----+  CCA Distal65      15                                                        +----------+--------+-------+--------+--------------------------------+----  ----+  ICA Prox  112     37     1-39%   smooth, heterogenous and                                                    calcific                                   +----------+--------+-------+--------+--------------------------------+----  ----+  ICA Mid   96      27                                                        +----------+--------+-------+--------+--------------------------------+----  ----+  ICA Distal59      16                                                        +----------+--------+-------+--------+--------------------------------+----  ----+  ECA      114     11             heterogenous and calcific                  +----------+--------+-------+--------+--------------------------------+----  ----+   +----------+--------+--------+---------+-------------------+           PSV cm/sEDV cm/sDescribe Arm Pressure (mmHG)  +----------+--------+--------+---------+-------------------+  [id number]            Turbulent                     +----------+--------+--------+---------+-------------------+   +---------+--------+--+--------+--+---------+  VertebralPSV cm/s37EDV cm/s12Antegrade  +---------+--------+--+--------+--+---------+         ASSESSMENT/PLAN: TRINITY HYLAND is a 78 y.o. male presenting with right-sided, asymptomatic, critical ICA stenosis.  Pertinent I had a long discussion regarding the above.  We discussed that with critical ICA stenosis, he has an 11% risk of stroke over the next 5 years.  With intervention, this is decreased to 5%.  He is healthy, and should have no problem living another 5 years.  We discussed that the next step would be CT angiogram head and neck in an effort to define the right sided ICA lesion.  This will allow for preoperative planning and surgical candidacy discussion, namely carotid endarterectomy versus transcarotid artery revascularization. I am aware that  this could be declined by radiology due to his CKD.  I do not have access to recent lab work which was performed at [facility].  I will allow radiology to make this call.  Jack [name] is aware that should radiology decline CT imaging, the only modality of carotid treatment would be carotid endarterectomy.  If this is the case, I will accompany him to our ultrasound lab to assess the atherosclerotic lesion and ensure that it is accessible.    My plan is to see Jack [name] in the coming weeks for surgical discussion.    Victorino Sparrow, MD [facility] [phone]

## 2023-08-15 ENCOUNTER — Ambulatory Visit: Admitting: Vascular Surgery

## 2023-08-15 ENCOUNTER — Encounter: Payer: Self-pay | Admitting: Vascular Surgery

## 2023-08-15 VITALS — BP 133/83 | HR 61 | Temp 98.4°F | Resp 18 | Ht 70.0 in | Wt 157.8 lb

## 2023-08-15 DIAGNOSIS — I6521 Occlusion and stenosis of right carotid artery: Secondary | ICD-10-CM

## 2023-08-16 ENCOUNTER — Other Ambulatory Visit: Payer: Self-pay

## 2023-08-16 DIAGNOSIS — I6521 Occlusion and stenosis of right carotid artery: Secondary | ICD-10-CM

## 2023-08-19 ENCOUNTER — Encounter (HOSPITAL_COMMUNITY): Payer: Self-pay | Admitting: Vascular Surgery

## 2023-08-19 ENCOUNTER — Other Ambulatory Visit: Payer: Self-pay

## 2023-08-19 NOTE — Progress Notes (Signed)
 Anesthesia Chart Review: Same day workup  78 year old male follows with cardiology for history of paroxysmal AF, HTN, HLD, carotid artery disease, and CAD.  Cath in 2010 showed nonobstructive disease but was complicated by atheroembolism resulting in progressive renal failure and placement of AV fistula which was eventually removed due to improved renal function.  Most recent nuclear stress test 10/29/22 was low risk with small apical lateral region of possible ischemia.  When last seen by Robin Searing on 05/29/2023 patient reported more frequent episodes of paroxysmal AF.  His flecainide was increased from 50 mg twice daily to 100 mg twice daily.  Event monitor, echo, carotid duplex were also ordered.  Event monitor showed less than 1% burden of atrial fibrillation.  Echo showed EF 55 to 60%, normal wall motion, grade 1 DD, normal RV function, no significant valvular abnormalities. Carotid duplex showed 1 to 39% stenosis in the LICA and progression of RICA stenosis, now measuring 80 to 99%.  He was referred to vascular surgery for further evaluation and management.  Patient was subsequently seen by Dr. Karin Lieu and endarterectomy was recommended.  Other pertinent history includes PONV, GERD, CKD 3b.  Patient will need day of surgery labs and evaluation.  EKG 01/11/2023: Sinus rhythm.  Rate 57.  Consider LAE.  Abnormal R wave progression, early transition.  LVH with secondary repolarization abnormality  TTE 07/04/2023:  1. Left ventricular ejection fraction, by estimation, is 55 to 60%. Left  ventricular ejection fraction by PLAX is 56 %. The left ventricle has  normal function. The left ventricle has no regional wall motion  abnormalities. There is mild concentric left  ventricular hypertrophy. Left ventricular diastolic parameters are  consistent with Grade I diastolic dysfunction (impaired relaxation).   2. Right ventricular systolic function is normal. The right ventricular  size is normal. There is  normal pulmonary artery systolic pressure.   3. The mitral valve is normal in structure. No evidence of mitral valve  regurgitation. No evidence of mitral stenosis.   4. The aortic valve is tricuspid. Aortic valve regurgitation is not  visualized. No aortic stenosis is present.   5. Aortic dilatation noted. There is mild dilatation of the aortic root,  measuring 40 mm.   6. The inferior vena cava is normal in size with greater than 50%  respiratory variability, suggesting right atrial pressure of 3 mmHg.   Event monitor 06/2023: Patient had a min HR of 45 bpm, max HR of 119 bpm, and avg HR of 65 bpm. Predominant underlying rhythm was Sinus Rhythm.  First Degree AV Block was present.  Atrial Fibrillation/Flutter occurred (<1% burden), ranging from 60-119 bpm (avg of 90 bpm), the longest lasting 13 mins 28 secs with an avg rate of 90 bpm. Atrial Fibrillation/Flutter was detected within +/- 45 seconds of symptomatic patient event(s).  Isolated SVEs were rare (<1.0%), and no SVE Couplets or SVE Triplets were present.  Isolated VEs were rare (<1.0%), and no VE Couplets or VE Triplets were present. Ventricular Bigeminy and Trigeminy were present.  Nuclear stress 10/29/2022:   Resting ECG shows no ST-segment deviation. T wave inversion noted in 2 or more leads.   No ST deviation was noted from baseline during infusion. The ECG was not diagnostic due to pharmacologic protocol.   Diaphragmatic attenuation artifact was present.   Defect 1: There is a small defect with mild reduction in uptake present in the apical lateral location(s) that is reversible. Cannot rule out a very small area of ischemia but like  represents diaphragmatic attenuation artificat. There is normal wall motion in the defect area.   Possible very mild ischemia in the apical lateral wall but likely related to diaphragmatic attenuation artifact. The study is low risk.   Left ventricular function is normal. Nuclear stress EF: 64 %. The  left ventricular ejection fraction is normal (55-65%). End diastolic cavity size is normal. End systolic cavity size is normal.   Prior study available for comparison from 02/27/2018 which showed normal perfusion.    Zannie Cove Asante [facility] Short Stay Center/Anesthesiology Phone [phone] 08/19/2023 2:47 PM

## 2023-08-19 NOTE — Progress Notes (Addendum)
 SDW CALL  Patient was given pre-op instructions over the phone. The opportunity was given for the patient to ask questions. No further questions asked. Patient verbalized understanding of instructions given.   PCP - Amy Moon,NP Cardiologist - Tedra Senegal  PPM/ICD - denies Device Orders -  Rep Notified -   Chest x-ray - 01/11/23 EKG - 01/11/23 Stress Test - 10/29/22 ECHO - 07/04/23 Cardiac Cath - 2010  Sleep Study - denies CPAP -   Fasting Blood Sugar - pt is not diabetic,but wife reports he is hypoglycemic. Encouraged him to have a late night snack prior to midnight.  Checks Blood Sugar _____ times a day  Blood Thinner Instructions:na Aspirin Instructions:continue  ERAS Protcol -no PRE-SURGERY Ensure or G2-   COVID TEST- na   Anesthesia review: yes-hx CAD, Afib,CKD,HTN  Patient denies shortness of breath, fever, cough and chest pain over the phone call    Surgical Instructions    Your procedure is scheduled on April 1  Report to [facility] Main Entrance "A" at 0700 A.M., then check in with the Admitting office.  Call this number if you have problems the morning of surgery:  [phone]    Remember:  Do not eat or drink  after midnight the night before your surgery   Take these medicines the morning of surgery with A SIP OF WATER: Norvasc,Aspirin,Flecainide,Imdur,Labetolol,Claritin,Pravastatin. PRN- Tylenol,Ocean nasal spray. Nitroglycerin- if you need to take this, call [phone] before coming to the hospital .  As of today, STOP taking any Aleve, Naproxen, Ibuprofen, Motrin, Advil, Goody's, BC's, all herbal medications, fish oil, and all vitamins.  [facility] is not responsible for any belongings or valuables. .   Do NOT Smoke (Tobacco/Vaping)  24 hours prior to your procedure  If you use a CPAP at night, you may bring your mask for your overnight stay.   Contacts, glasses, hearing aids, dentures or partials may not be worn into surgery,  please bring cases for these belongings   Patients discharged the day of surgery will not be allowed to drive home, and someone needs to stay with them for 24 hours.    Special instructions:    Oral Hygiene is also important to reduce your risk of infection.  Remember - BRUSH YOUR TEETH THE MORNING OF SURGERY WITH YOUR REGULAR TOOTHPASTE   Day of Surgery:  Take a shower the day of or night before with antibacterial soap. Wear Clean/Comfortable clothing the morning of surgery Do not apply any deodorants/lotions.   Do not wear jewelry or makeup Do not wear lotions, powders, perfumes/colognes, or deodorant. Do not shave 48 hours prior to surgery.  Men may shave face and neck. Do not bring valuables to the hospital. Do not wear nail polish, gel polish, artificial nails, or any other type of covering on natural nails (fingers and toes) If you have artificial nails or gel coating that need to be removed by a nail salon, please have this removed prior to surgery. Artificial nails or gel coating may interfere with anesthesia's ability to adequately monitor your vital signs. Remember to brush your teeth WITH YOUR REGULAR TOOTHPASTE.

## 2023-08-19 NOTE — Anesthesia Preprocedure Evaluation (Signed)
 Anesthesia Evaluation  Patient identified by MRN, date of birth, ID band Patient awake    Reviewed: Allergy & Precautions, NPO status , Patient's Chart, lab work & pertinent test results, reviewed documented beta blocker date and time   History of Anesthesia Complications (+) PONV and history of anesthetic complications  Airway Mallampati: II  TM Distance: >3 FB Neck ROM: Full    Dental  (+) Dental Advisory Given, Teeth Intact   Pulmonary asthma , former smoker   Pulmonary exam normal        Cardiovascular hypertension, Pt. on medications and Pt. on home beta blockers + CAD and + Peripheral Vascular Disease  Normal cardiovascular exam+ dysrhythmias Atrial Fibrillation    '25 TTE - EF 55 to 60%. There is mild concentric left ventricular hypertrophy. Grade I diastolic dysfunction (impaired relaxation). There is mild dilatation of the aortic root, measuring 40 mm.   '25 Carotid US - 80-99% right ICAS    Neuro/Psych negative neurological ROS  negative psych ROS   GI/Hepatic Neg liver ROS,GERD  Controlled,,  Endo/Other  negative endocrine ROS    Renal/GU CRFRenal disease Had fistula placed during bout of ARF in 2011, but per patient, he was never started on HD and fistula was ligated       Musculoskeletal  (+) Arthritis ,    Abdominal   Peds  Hematology negative hematology ROS (+)   Anesthesia Other Findings   Reproductive/Obstetrics                             Anesthesia Physical Anesthesia Plan  ASA: 3  Anesthesia Plan: General   Post-op Pain Management: Tylenol PO (pre-op)*   Induction: Intravenous  PONV Risk Score and Plan: 3 and Treatment may vary due to age or medical condition, Ondansetron and TIVA  Airway Management Planned: Oral ETT  Additional Equipment: Arterial line  Intra-op Plan:   Post-operative Plan: Extubation in OR  Informed Consent: I have reviewed the  patients History and Physical, chart, labs and discussed the procedure including the risks, benefits and alternatives for the proposed anesthesia with the patient or authorized representative who has indicated his/her understanding and acceptance.     Dental advisory given  Plan Discussed with: CRNA and Anesthesiologist  Anesthesia Plan Comments: (PAT note by Antionette Poles, PA-C )        Anesthesia Quick Evaluation

## 2023-08-20 ENCOUNTER — Inpatient Hospital Stay (HOSPITAL_COMMUNITY)
Admission: RE | Admit: 2023-08-20 | Discharge: 2023-08-21 | DRG: 038 | Disposition: A | Discharge status: Home / Self Care | Attending: Vascular Surgery | Admitting: Vascular Surgery

## 2023-08-20 ENCOUNTER — Other Ambulatory Visit: Payer: Self-pay

## 2023-08-20 ENCOUNTER — Encounter (HOSPITAL_COMMUNITY): Payer: Self-pay | Admitting: Vascular Surgery

## 2023-08-20 ENCOUNTER — Inpatient Hospital Stay (HOSPITAL_COMMUNITY): Payer: Self-pay | Admitting: Anesthesiology

## 2023-08-20 ENCOUNTER — Encounter (HOSPITAL_COMMUNITY)
Admission: RE | Disposition: A | Discharge status: Home / Self Care | Payer: Self-pay | Source: Home / Self Care | Attending: Vascular Surgery

## 2023-08-20 DIAGNOSIS — I2510 Atherosclerotic heart disease of native coronary artery without angina pectoris: Secondary | ICD-10-CM | POA: Diagnosis present

## 2023-08-20 DIAGNOSIS — L4050 Arthropathic psoriasis, unspecified: Secondary | ICD-10-CM | POA: Diagnosis present

## 2023-08-20 DIAGNOSIS — N1832 Chronic kidney disease, stage 3b: Secondary | ICD-10-CM | POA: Diagnosis not present

## 2023-08-20 DIAGNOSIS — Z85828 Personal history of other malignant neoplasm of skin: Secondary | ICD-10-CM | POA: Diagnosis not present

## 2023-08-20 DIAGNOSIS — Z87891 Personal history of nicotine dependence: Secondary | ICD-10-CM | POA: Diagnosis not present

## 2023-08-20 DIAGNOSIS — I480 Paroxysmal atrial fibrillation: Secondary | ICD-10-CM | POA: Diagnosis present

## 2023-08-20 DIAGNOSIS — Z8249 Family history of ischemic heart disease and other diseases of the circulatory system: Secondary | ICD-10-CM | POA: Diagnosis not present

## 2023-08-20 DIAGNOSIS — Z888 Allergy status to other drugs, medicaments and biological substances status: Secondary | ICD-10-CM

## 2023-08-20 DIAGNOSIS — I6521 Occlusion and stenosis of right carotid artery: Principal | ICD-10-CM | POA: Diagnosis present

## 2023-08-20 DIAGNOSIS — Z87442 Personal history of urinary calculi: Secondary | ICD-10-CM | POA: Diagnosis not present

## 2023-08-20 DIAGNOSIS — I6529 Occlusion and stenosis of unspecified carotid artery: Principal | ICD-10-CM | POA: Diagnosis present

## 2023-08-20 DIAGNOSIS — I10 Essential (primary) hypertension: Secondary | ICD-10-CM

## 2023-08-20 DIAGNOSIS — I129 Hypertensive chronic kidney disease with stage 1 through stage 4 chronic kidney disease, or unspecified chronic kidney disease: Secondary | ICD-10-CM | POA: Diagnosis not present

## 2023-08-20 DIAGNOSIS — I472 Ventricular tachycardia, unspecified: Secondary | ICD-10-CM | POA: Diagnosis not present

## 2023-08-20 DIAGNOSIS — I739 Peripheral vascular disease, unspecified: Secondary | ICD-10-CM | POA: Diagnosis not present

## 2023-08-20 DIAGNOSIS — Z79899 Other long term (current) drug therapy: Secondary | ICD-10-CM

## 2023-08-20 DIAGNOSIS — Z825 Family history of asthma and other chronic lower respiratory diseases: Secondary | ICD-10-CM | POA: Diagnosis not present

## 2023-08-20 DIAGNOSIS — Z885 Allergy status to narcotic agent status: Secondary | ICD-10-CM | POA: Diagnosis not present

## 2023-08-20 DIAGNOSIS — K219 Gastro-esophageal reflux disease without esophagitis: Secondary | ICD-10-CM | POA: Diagnosis not present

## 2023-08-20 DIAGNOSIS — Z7982 Long term (current) use of aspirin: Secondary | ICD-10-CM

## 2023-08-20 DIAGNOSIS — Z860100 Personal history of colon polyps, unspecified: Secondary | ICD-10-CM | POA: Diagnosis not present

## 2023-08-20 DIAGNOSIS — I4891 Unspecified atrial fibrillation: Secondary | ICD-10-CM | POA: Diagnosis not present

## 2023-08-20 DIAGNOSIS — N189 Chronic kidney disease, unspecified: Secondary | ICD-10-CM | POA: Diagnosis not present

## 2023-08-20 DIAGNOSIS — E785 Hyperlipidemia, unspecified: Secondary | ICD-10-CM | POA: Diagnosis present

## 2023-08-20 HISTORY — PX: PATCH ANGIOPLASTY: SHX6230

## 2023-08-20 HISTORY — PX: ENDARTERECTOMY: SHX5162

## 2023-08-20 LAB — URINALYSIS, ROUTINE W REFLEX MICROSCOPIC
Bilirubin Urine: NEGATIVE
Glucose, UA: NEGATIVE mg/dL
Hgb urine dipstick: NEGATIVE
Ketones, ur: NEGATIVE mg/dL
Leukocytes,Ua: NEGATIVE
Nitrite: NEGATIVE
Protein, ur: NEGATIVE mg/dL
Specific Gravity, Urine: 1.01 (ref 1.005–1.030)
pH: 6 (ref 5.0–8.0)

## 2023-08-20 LAB — SURGICAL PCR SCREEN
MRSA, PCR: NEGATIVE
Staphylococcus aureus: POSITIVE — AB

## 2023-08-20 LAB — COMPREHENSIVE METABOLIC PANEL WITH GFR
ALT: 20 U/L (ref 0–44)
AST: 19 U/L (ref 15–41)
Albumin: 3.7 g/dL (ref 3.5–5.0)
Alkaline Phosphatase: 67 U/L (ref 38–126)
Anion gap: 10 (ref 5–15)
BUN: 30 mg/dL — ABNORMAL HIGH (ref 8–23)
CO2: 25 mmol/L (ref 22–32)
Calcium: 9.2 mg/dL (ref 8.9–10.3)
Chloride: 107 mmol/L (ref 98–111)
Creatinine, Ser: 1.98 mg/dL — ABNORMAL HIGH (ref 0.61–1.24)
GFR, Estimated: 34 mL/min — ABNORMAL LOW (ref >60)
Glucose, Bld: 103 mg/dL — ABNORMAL HIGH (ref 70–99)
Potassium: 3.9 mmol/L (ref 3.5–5.1)
Sodium: 142 mmol/L (ref 135–145)
Total Bilirubin: 0.9 mg/dL (ref 0.0–1.2)
Total Protein: 6.2 g/dL — ABNORMAL LOW (ref 6.5–8.1)

## 2023-08-20 LAB — APTT: aPTT: 23 s — ABNORMAL LOW (ref 24–36)

## 2023-08-20 LAB — TYPE AND SCREEN
ABO/RH(D): A NEG
Antibody Screen: NEGATIVE

## 2023-08-20 LAB — CBC
HCT: 47.3 % (ref 39.0–52.0)
Hemoglobin: 16.1 g/dL (ref 13.0–17.0)
MCH: 31.1 pg (ref 26.0–34.0)
MCHC: 34 g/dL (ref 30.0–36.0)
MCV: 91.3 fL (ref 80.0–100.0)
Platelets: 247 10*3/uL (ref 150–400)
RBC: 5.18 MIL/uL (ref 4.22–5.81)
RDW: 14.3 % (ref 11.5–15.5)
WBC: 7.6 10*3/uL (ref 4.0–10.5)
nRBC: 0 % (ref 0.0–0.2)

## 2023-08-20 LAB — PROTIME-INR
INR: 1 (ref 0.8–1.2)
Prothrombin Time: 13.2 s (ref 11.4–15.2)

## 2023-08-20 LAB — TROPONIN I (HIGH SENSITIVITY): Troponin I (High Sensitivity): 3 ng/L (ref <18)

## 2023-08-20 LAB — ABO/RH: ABO/RH(D): A NEG

## 2023-08-20 SURGERY — ENDARTERECTOMY, CAROTID
Anesthesia: General | Site: Neck | Laterality: Right

## 2023-08-20 MED ORDER — PHENYLEPHRINE HCL-NACL 20-0.9 MG/250ML-% IV SOLN
INTRAVENOUS | Status: DC | PRN
  Administered 2023-08-20: 15 ug/min via INTRAVENOUS

## 2023-08-20 MED ORDER — PHENYLEPHRINE HCL-NACL 20-0.9 MG/250ML-% IV SOLN: INTRAVENOUS | Status: DC | PRN

## 2023-08-20 MED ORDER — MUPIROCIN 2 % EX OINT
1.0000 | TOPICAL_OINTMENT | Freq: Two times a day (BID) | CUTANEOUS | Status: DC
  Administered 2023-08-21: 1 via NASAL
  Filled 2023-08-20: qty 22

## 2023-08-20 MED ORDER — MORPHINE SULFATE (PF) 2 MG/ML IV SOLN
2.0000 mg | INTRAVENOUS | Status: DC | PRN
  Administered 2023-08-20: 2 mg via INTRAVENOUS
  Filled 2023-08-20: qty 1

## 2023-08-20 MED ORDER — AMLODIPINE BESYLATE 5 MG PO TABS
5.0000 mg | ORAL_TABLET | Freq: Every day | ORAL | Status: DC
  Administered 2023-08-20: 5 mg via ORAL
  Filled 2023-08-20: qty 1

## 2023-08-20 MED ORDER — SURGIFLO WITH THROMBIN (HEMOSTATIC MATRIX KIT) OPTIME
TOPICAL | Status: DC | PRN
  Administered 2023-08-20: 1 via TOPICAL

## 2023-08-20 MED ORDER — CHLORHEXIDINE GLUCONATE 0.12 % MT SOLN
15.0000 mL | Freq: Once | OROMUCOSAL | Status: AC
  Administered 2023-08-20: 15 mL via OROMUCOSAL
  Filled 2023-08-20: qty 15

## 2023-08-20 MED ORDER — PROPOFOL 10 MG/ML IV BOLUS
INTRAVENOUS | Status: DC | PRN
  Administered 2023-08-20: 40 mg via INTRAVENOUS
  Administered 2023-08-20 (×3): 20 mg via INTRAVENOUS
  Administered 2023-08-20: 120 mg via INTRAVENOUS
  Administered 2023-08-20: 20 mg via INTRAVENOUS

## 2023-08-20 MED ORDER — FLECAINIDE ACETATE 50 MG PO TABS
50.0000 mg | ORAL_TABLET | Freq: Two times a day (BID) | ORAL | Status: DC
  Administered 2023-08-20 – 2023-08-21 (×2): 50 mg via ORAL
  Filled 2023-08-20 (×2): qty 1

## 2023-08-20 MED ORDER — POTASSIUM CHLORIDE CRYS ER 20 MEQ PO TBCR: 20.0000 meq | EXTENDED_RELEASE_TABLET | Freq: Every day | ORAL | Status: DC | PRN

## 2023-08-20 MED ORDER — ONDANSETRON HCL 4 MG/2ML IJ SOLN: 4.0000 mg | Freq: Four times a day (QID) | INTRAMUSCULAR | Status: DC | PRN

## 2023-08-20 MED ORDER — 0.9 % SODIUM CHLORIDE (POUR BTL) OPTIME
TOPICAL | Status: DC | PRN
  Administered 2023-08-20: 2000 mL

## 2023-08-20 MED ORDER — CHLORHEXIDINE GLUCONATE CLOTH 2 % EX PADS: 6.0000 | MEDICATED_PAD | Freq: Once | CUTANEOUS | Status: DC

## 2023-08-20 MED ORDER — HEPARIN 6000 UNIT IRRIGATION SOLUTION
Status: AC
  Filled 2023-08-20: qty 500

## 2023-08-20 MED ORDER — PROTAMINE SULFATE 10 MG/ML IV SOLN
INTRAVENOUS | Status: DC | PRN
  Administered 2023-08-20: 40 mg via INTRAVENOUS
  Administered 2023-08-20: 10 mg via INTRAVENOUS

## 2023-08-20 MED ORDER — PHENYLEPHRINE 80 MCG/ML (10ML) SYRINGE FOR IV PUSH (FOR BLOOD PRESSURE SUPPORT)
PREFILLED_SYRINGE | INTRAVENOUS | Status: DC | PRN
  Administered 2023-08-20: 80 ug via INTRAVENOUS

## 2023-08-20 MED ORDER — ONDANSETRON HCL 4 MG/2ML IJ SOLN: 4.0000 mg | Freq: Once | INTRAMUSCULAR | Status: DC | PRN

## 2023-08-20 MED ORDER — HEPARIN SODIUM (PORCINE) 1000 UNIT/ML IJ SOLN
INTRAMUSCULAR | Status: DC | PRN
  Administered 2023-08-20 (×3): 2000 [IU] via INTRAVENOUS
  Administered 2023-08-20: 6000 [IU] via INTRAVENOUS

## 2023-08-20 MED ORDER — LIDOCAINE HCL (PF) 1 % IJ SOLN
INTRAMUSCULAR | Status: AC
  Filled 2023-08-20: qty 30

## 2023-08-20 MED ORDER — ALUM & MAG HYDROXIDE-SIMETH 200-200-20 MG/5ML PO SUSP: 15.0000 mL | ORAL | Status: DC | PRN

## 2023-08-20 MED ORDER — ACETAMINOPHEN 500 MG PO TABS
1000.0000 mg | ORAL_TABLET | Freq: Once | ORAL | Status: AC
  Administered 2023-08-20: 1000 mg via ORAL
  Filled 2023-08-20: qty 2

## 2023-08-20 MED ORDER — DOCUSATE SODIUM 100 MG PO CAPS
100.0000 mg | ORAL_CAPSULE | Freq: Every day | ORAL | Status: DC
  Administered 2023-08-21: 100 mg via ORAL
  Filled 2023-08-20: qty 1

## 2023-08-20 MED ORDER — LIDOCAINE-EPINEPHRINE (PF) 1 %-1:200000 IJ SOLN
INTRAMUSCULAR | Status: AC
  Filled 2023-08-20: qty 30

## 2023-08-20 MED ORDER — LIDOCAINE 2% (20 MG/ML) 5 ML SYRINGE
INTRAMUSCULAR | Status: DC | PRN
  Administered 2023-08-20: 60 mg via INTRAVENOUS

## 2023-08-20 MED ORDER — LIDOCAINE-EPINEPHRINE (PF) 1 %-1:200000 IJ SOLN
INTRAMUSCULAR | Status: DC | PRN
  Administered 2023-08-20: 30 mL

## 2023-08-20 MED ORDER — METOPROLOL TARTRATE 5 MG/5ML IV SOLN: 2.0000 mg | INTRAVENOUS | Status: DC | PRN

## 2023-08-20 MED ORDER — SUGAMMADEX SODIUM 200 MG/2ML IV SOLN
INTRAVENOUS | Status: DC | PRN
  Administered 2023-08-20: 200 mg via INTRAVENOUS

## 2023-08-20 MED ORDER — GUAIFENESIN-DM 100-10 MG/5ML PO SYRP
15.0000 mL | ORAL_SOLUTION | ORAL | Status: DC | PRN
  Administered 2023-08-20: 15 mL via ORAL
  Filled 2023-08-20: qty 15

## 2023-08-20 MED ORDER — CHLORHEXIDINE GLUCONATE CLOTH 2 % EX PADS
6.0000 | MEDICATED_PAD | Freq: Every day | CUTANEOUS | Status: DC
  Administered 2023-08-20 – 2023-08-21 (×2): 6 via TOPICAL

## 2023-08-20 MED ORDER — ACETAMINOPHEN 650 MG RE SUPP: 325.0000 mg | RECTAL | Status: DC | PRN

## 2023-08-20 MED ORDER — NITROGLYCERIN 0.4 MG SL SUBL
SUBLINGUAL_TABLET | SUBLINGUAL | Status: AC
  Administered 2023-08-20: 0.4 mg
  Filled 2023-08-20: qty 5

## 2023-08-20 MED ORDER — ISOSORBIDE MONONITRATE ER 30 MG PO TB24
30.0000 mg | ORAL_TABLET | Freq: Every day | ORAL | Status: DC
  Administered 2023-08-21: 30 mg via ORAL
  Filled 2023-08-20: qty 1

## 2023-08-20 MED ORDER — CEFAZOLIN SODIUM-DEXTROSE 2-4 GM/100ML-% IV SOLN
2.0000 g | INTRAVENOUS | Status: AC
  Administered 2023-08-20: 2 g via INTRAVENOUS
  Filled 2023-08-20: qty 100

## 2023-08-20 MED ORDER — SODIUM CHLORIDE 0.9 % IV SOLN: 500.0000 mL | Freq: Once | INTRAVENOUS | Status: DC | PRN

## 2023-08-20 MED ORDER — MAGNESIUM SULFATE 2 GM/50ML IV SOLN: 2.0000 g | Freq: Every day | INTRAVENOUS | Status: DC | PRN

## 2023-08-20 MED ORDER — FENTANYL CITRATE (PF) 250 MCG/5ML IJ SOLN
INTRAMUSCULAR | Status: DC | PRN
  Administered 2023-08-20 (×2): 50 ug via INTRAVENOUS

## 2023-08-20 MED ORDER — SODIUM CHLORIDE 0.9 % IV SOLN
INTRAVENOUS | Status: DC
  Administered 2023-08-20: 10 mL/h via INTRAVENOUS

## 2023-08-20 MED ORDER — PHENOL 1.4 % MT LIQD: 1.0000 | OROMUCOSAL | Status: DC | PRN

## 2023-08-20 MED ORDER — FENTANYL CITRATE (PF) 100 MCG/2ML IJ SOLN: 25.0000 ug | INTRAMUSCULAR | Status: DC | PRN

## 2023-08-20 MED ORDER — ROCURONIUM BROMIDE 10 MG/ML (PF) SYRINGE
PREFILLED_SYRINGE | INTRAVENOUS | Status: DC | PRN
  Administered 2023-08-20: 10 mg via INTRAVENOUS
  Administered 2023-08-20: 60 mg via INTRAVENOUS
  Administered 2023-08-20: 10 mg via INTRAVENOUS
  Administered 2023-08-20: 20 mg via INTRAVENOUS

## 2023-08-20 MED ORDER — LACTATED RINGERS IV SOLN: INTRAVENOUS | Status: DC

## 2023-08-20 MED ORDER — DEXAMETHASONE SODIUM PHOSPHATE 10 MG/ML IJ SOLN
INTRAMUSCULAR | Status: DC | PRN
  Administered 2023-08-20: 10 mg via INTRAVENOUS

## 2023-08-20 MED ORDER — PROPOFOL 500 MG/50ML IV EMUL
INTRAVENOUS | Status: DC | PRN
  Administered 2023-08-20: 100 ug/kg/min via INTRAVENOUS

## 2023-08-20 MED ORDER — ACETAMINOPHEN 325 MG PO TABS
325.0000 mg | ORAL_TABLET | ORAL | Status: DC | PRN
  Administered 2023-08-20: 650 mg via ORAL
  Filled 2023-08-20: qty 2

## 2023-08-20 MED ORDER — TRAMADOL HCL 50 MG PO TABS
50.0000 mg | ORAL_TABLET | Freq: Four times a day (QID) | ORAL | Status: DC | PRN
  Administered 2023-08-20 – 2023-08-21 (×2): 50 mg via ORAL
  Filled 2023-08-20 (×2): qty 1

## 2023-08-20 MED ORDER — LABETALOL HCL 5 MG/ML IV SOLN
10.0000 mg | INTRAVENOUS | Status: DC | PRN
  Administered 2023-08-20 (×2): 10 mg via INTRAVENOUS
  Filled 2023-08-20: qty 4

## 2023-08-20 MED ORDER — HEPARIN 6000 UNIT IRRIGATION SOLUTION
Status: DC | PRN
  Administered 2023-08-20: 1

## 2023-08-20 MED ORDER — ORAL CARE MOUTH RINSE: 15.0000 mL | Freq: Once | OROMUCOSAL | Status: AC

## 2023-08-20 MED ORDER — PRAVASTATIN SODIUM 40 MG PO TABS
40.0000 mg | ORAL_TABLET | Freq: Every day | ORAL | Status: DC
  Administered 2023-08-20 – 2023-08-21 (×2): 40 mg via ORAL
  Filled 2023-08-20 (×2): qty 1

## 2023-08-20 MED ORDER — LABETALOL HCL 200 MG PO TABS
100.0000 mg | ORAL_TABLET | Freq: Two times a day (BID) | ORAL | Status: DC
Start: 2023-08-20 — End: 2023-08-21
  Administered 2023-08-20 – 2023-08-21 (×2): 100 mg via ORAL
  Filled 2023-08-20 (×2): qty 1

## 2023-08-20 MED ORDER — CEFAZOLIN SODIUM-DEXTROSE 2-4 GM/100ML-% IV SOLN
2.0000 g | Freq: Three times a day (TID) | INTRAVENOUS | Status: AC
  Administered 2023-08-20 – 2023-08-21 (×2): 2 g via INTRAVENOUS
  Filled 2023-08-20 (×2): qty 100

## 2023-08-20 MED ORDER — ASPIRIN 81 MG PO TBEC
81.0000 mg | DELAYED_RELEASE_TABLET | Freq: Every day | ORAL | Status: DC
  Administered 2023-08-20 – 2023-08-21 (×2): 81 mg via ORAL
  Filled 2023-08-20 (×2): qty 1

## 2023-08-20 MED ORDER — HYDRALAZINE HCL 20 MG/ML IJ SOLN: 5.0000 mg | INTRAMUSCULAR | Status: DC | PRN

## 2023-08-20 MED ORDER — HEMOSTATIC AGENTS (NO CHARGE) OPTIME
TOPICAL | Status: DC | PRN
Start: 2023-08-20 — End: 2023-08-20
  Administered 2023-08-20: 1 via TOPICAL

## 2023-08-20 MED ORDER — FENTANYL CITRATE (PF) 250 MCG/5ML IJ SOLN
INTRAMUSCULAR | Status: AC
  Filled 2023-08-20: qty 5

## 2023-08-20 MED ORDER — PANTOPRAZOLE SODIUM 40 MG PO TBEC
40.0000 mg | DELAYED_RELEASE_TABLET | Freq: Every day | ORAL | Status: DC
  Administered 2023-08-21: 40 mg via ORAL
  Filled 2023-08-20 (×2): qty 1

## 2023-08-20 MED ORDER — SODIUM CHLORIDE 0.9 % IV SOLN: INTRAVENOUS | Status: DC

## 2023-08-20 MED ORDER — EPHEDRINE SULFATE-NACL 50-0.9 MG/10ML-% IV SOSY
PREFILLED_SYRINGE | INTRAVENOUS | Status: DC | PRN
  Administered 2023-08-20 (×2): 5 mg via INTRAVENOUS

## 2023-08-20 SURGICAL SUPPLY — 52 items
APPLIER CLIP 9.375 SM OPEN (CLIP) ×2 IMPLANT
BAG COUNTER SPONGE SURGICOUNT (BAG) ×2 IMPLANT
CANISTER SUCT 3000ML PPV (MISCELLANEOUS) ×2 IMPLANT
CANNULA VESSEL 3MM 2 BLNT TIP (CANNULA) ×2 IMPLANT
CATH ROBINSON RED A/P 18FR (CATHETERS) ×2 IMPLANT
CLIP APPLIE 9.375 SM OPEN (CLIP) ×2 IMPLANT
CLIP TI MEDIUM 24 (CLIP) ×2 IMPLANT
CLIP TI MEDIUM 6 (CLIP) ×2 IMPLANT
CLIP TI WIDE RED SMALL 24 (CLIP) ×2 IMPLANT
CLIP TI WIDE RED SMALL 6 (CLIP) ×2 IMPLANT
COVER PROBE W GEL 5X96 (DRAPES) ×2 IMPLANT
COVER TRANSDUCER ULTRASND GEL (DISPOSABLE) ×2 IMPLANT
DERMABOND ADVANCED .7 DNX12 (GAUZE/BANDAGES/DRESSINGS) ×2 IMPLANT
DRAIN CHANNEL 15F RND FF W/TCR (WOUND CARE) IMPLANT
ELECT REM PT RETURN 9FT ADLT (ELECTROSURGICAL) ×2 IMPLANT
ELECTRODE REM PT RTRN 9FT ADLT (ELECTROSURGICAL) ×2 IMPLANT
EVACUATOR SILICONE 100CC (DRAIN) IMPLANT
GLOVE BIOGEL PI IND STRL 8 (GLOVE) ×2 IMPLANT
GOWN STRL REUS W/ TWL LRG LVL3 (GOWN DISPOSABLE) ×4 IMPLANT
GOWN STRL REUS W/TWL 2XL LVL3 (GOWN DISPOSABLE) ×4 IMPLANT
HEMOSTAT SNOW SURGICEL 2X4 (HEMOSTASIS) IMPLANT
KIT BASIN OR (CUSTOM PROCEDURE TRAY) ×2 IMPLANT
KIT SHUNT ARGYLE CAROTID ART 6 (VASCULAR PRODUCTS) IMPLANT
KIT TURNOVER KIT B (KITS) ×2 IMPLANT
NDL HYPO 25GX1X1/2 BEV (NEEDLE) IMPLANT
NDL SPNL 20GX3.5 QUINCKE YW (NEEDLE) IMPLANT
NEEDLE HYPO 25GX1X1/2 BEV (NEEDLE) IMPLANT
NEEDLE SPNL 20GX3.5 QUINCKE YW (NEEDLE) IMPLANT
NS IRRIG 1000ML POUR BTL (IV SOLUTION) ×6 IMPLANT
PACK CAROTID (CUSTOM PROCEDURE TRAY) ×2 IMPLANT
PAD ARMBOARD POSITIONER FOAM (MISCELLANEOUS) ×4 IMPLANT
PATCH VASC XENOSURE 1X6 (Vascular Products) IMPLANT
POSITIONER HEAD DONUT 9IN (MISCELLANEOUS) ×2 IMPLANT
SET WALTER ACTIVATION W/DRAPE (SET/KITS/TRAYS/PACK) ×2 IMPLANT
SHUNT CAROTID BYPASS 10 (VASCULAR PRODUCTS) IMPLANT
SHUNT CAROTID BYPASS 12FRX15.5 (VASCULAR PRODUCTS) IMPLANT
SHUNT CAROTID FLEXCEL 14.5 (SHUNT) IMPLANT
SPONGE SURGIFOAM ABS GEL 100 (HEMOSTASIS) IMPLANT
STOPCOCK 4 WAY LG BORE MALE ST (IV SETS) IMPLANT
SURGIFLO W/THROMBIN 8M KIT (HEMOSTASIS) IMPLANT
SUT ETHILON 3 0 PS 1 (SUTURE) IMPLANT
SUT MNCRL AB 4-0 PS2 18 (SUTURE) ×2 IMPLANT
SUT PROLENE 6 0 BV (SUTURE) ×2 IMPLANT
SUT PROLENE 7 0 BV1 MDA (SUTURE) IMPLANT
SUT SILK 3-0 18XBRD TIE 12 (SUTURE) IMPLANT
SUT VIC AB 2-0 CT1 TAPERPNT 27 (SUTURE) ×2 IMPLANT
SUT VIC AB 3-0 SH 27X BRD (SUTURE) ×2 IMPLANT
SYR 20CC LL (SYRINGE) ×4 IMPLANT
SYR CONTROL 10ML LL (SYRINGE) IMPLANT
TOWEL GREEN STERILE (TOWEL DISPOSABLE) ×2 IMPLANT
VASCULAR TIE MINI RED 18IN STL (MISCELLANEOUS) IMPLANT
WATER STERILE IRR 1000ML POUR (IV SOLUTION) ×2 IMPLANT

## 2023-08-20 NOTE — Transfer of Care (Signed)
 Immediate Anesthesia Transfer of Care Note  Patient: Jack [name]  Procedure(s) Performed: ENDARTERECTOMY, CAROTID (Right) PATCH ANGIOPLASTY USING XENOSURE BIOLOGIC PATCH (Right: Neck)  Patient Location: PACU  Anesthesia Type:General  Level of Consciousness: awake, alert , oriented, and patient cooperative  Airway & Oxygen Therapy: Patient Spontanous Breathing and Patient connected to face mask oxygen  Post-op Assessment: Report given to RN, Post -op Vital signs reviewed and stable, Patient moving all extremities, Patient moving all extremities X 4, and Patient able to stick tongue midline  Post vital signs: Reviewed and stable  Last Vitals:  Vitals Value Taken Time  BP    Temp    Pulse    Resp    SpO2      Last Pain:  Vitals:   08/20/23 0758  TempSrc:   PainSc: 0-No pain         Complications: No notable events documented.

## 2023-08-20 NOTE — Anesthesia Postprocedure Evaluation (Signed)
 Anesthesia Post Note  Patient: Jack [name]  Procedure(s) Performed: ENDARTERECTOMY, CAROTID (Right) PATCH ANGIOPLASTY USING XENOSURE BIOLOGIC PATCH (Right: Neck)     Patient location during evaluation: PACU Anesthesia Type: General Level of consciousness: awake and alert Pain management: pain level controlled Vital Signs Assessment: post-procedure vital signs reviewed and stable Respiratory status: spontaneous breathing, nonlabored ventilation and respiratory function stable Cardiovascular status: stable and blood pressure returned to baseline Anesthetic complications: no  No notable events documented.  Last Vitals:  Vitals:   08/20/23 1345 08/20/23 1400  BP: 128/83 138/79  Pulse: 64 64  Resp: (!) 8 (!) 8  Temp:  36.6 C  SpO2: 94% 94%    Last Pain:  Vitals:   08/20/23 1400  TempSrc:   PainSc: 0-No pain    LLE Motor Response: Purposeful movement (08/20/23 1400) LLE Sensation: Full sensation (08/20/23 1400) RLE Motor Response: Purposeful movement (08/20/23 1400) RLE Sensation: Full sensation (08/20/23 1400)      Beryle Lathe

## 2023-08-20 NOTE — Anesthesia Procedure Notes (Signed)
 Procedure Name: Intubation Date/Time: 08/20/2023 10:19 AM  Performed by: Marcy Siren, RNPre-anesthesia Checklist: Patient identified, Emergency Drugs available, Suction available and Patient being monitored Patient Re-evaluated:Patient Re-evaluated prior to induction Oxygen Delivery Method: Circle System Utilized Preoxygenation: Pre-oxygenation with 100% oxygen Induction Type: IV induction Ventilation: Mask ventilation without difficulty Laryngoscope Size: Mac and 4 Grade View: Grade I Tube type: Oral Tube size: 7.0 mm Number of attempts: 1 Airway Equipment and Method: Stylet and Oral airway Placement Confirmation: ETT inserted through vocal cords under direct vision, positive ETCO2 and breath sounds checked- equal and bilateral Secured at: 22 cm Tube secured with: Tape Dental Injury: Teeth and Oropharynx as per pre-operative assessment

## 2023-08-20 NOTE — Anesthesia Procedure Notes (Signed)
 Arterial Line Insertion Performed by: Samara Deist, CRNA, CRNA  Patient location: Pre-op. Preanesthetic checklist: patient identified, IV checked, site marked, risks and benefits discussed, surgical consent, monitors and equipment checked, pre-op evaluation, timeout performed and anesthesia consent Lidocaine 1% used for infiltration Left, radial was placed Catheter size: 20 G Hand hygiene performed  and maximum sterile barriers used  Allen's test indicative of satisfactory collateral circulation Attempts: 2 Procedure performed without using ultrasound guided technique. Following insertion, dressing applied and Biopatch. Post procedure assessment: normal  Post procedure complications: unsuccessful attempts, second provider assisted and local hematoma (attemp by Randon Goldsmith, CRNA on right side, draws back very slowly but with hematoma. A-line removed and pressure dressing applied.). Patient tolerated the procedure well with no immediate complications.

## 2023-08-20 NOTE — Op Note (Signed)
 NAME: Jack [name]    MRN: [id number] DOB: Jul 25, 1945    DATE OF OPERATION: 08/20/2023  PREOP DIAGNOSIS:    Asymptomatic, critical right internal carotid artery stenosis  POSTOP DIAGNOSIS:    Same  PROCEDURE:    Right carotid endarterectomy  SURGEON: Victorino Sparrow  ASSIST: Emilie Rutter, PA  ANESTHESIA: General   EBL: 50ml  INDICATIONS:   Jack [name] is a 78 y.o. male male presenting with right-sided, asymptomatic, critical ICA stenosis. I was present with our ultrasonographer when she uncinated the right sided internal carotid artery.  There was a bulb lesion that extended 2 cm into the internal carotid artery.  The lesion was at the angle of the mandible. Pertinent I had a long discussion regarding the above.  We discussed that with critical ICA stenosis, he has an 11% risk of stroke over the next 5 years.  With intervention, this is decreased to 5%.  He is healthy, and should have no problem living another 5 years.   Being that Jack [name] was unable to have a contrasted CT scan, I offered him right-sided carotid endarterectomy for asymptomatic right ICA stenosis.  We discussed the risks and benefits at length.  All questions were answered.  After discussing these risks and benefits of surgery, [name] elected to proceed.  FINDINGS:    95% stenosis with ulcerated right carotid bulb plaque extending into the left internal carotid artery   TECHNIQUE:   After full informed written consent was obtained from the patient, the patient was brought back to the operating room and placed supine upon the operating table.  Prior to induction, the patient received IV antibiotics.  After obtaining adequate anesthesia, the patient was placed into semi-Fowler position with a shoulder roll in place and the patient's neck slightly hyperextended and rotated away from the surgical site.  The patient was prepped in the standard fashion for a right carotid endarterectomy.   I made an incision  anterior to the sternocleidomastoid muscle and dissected down through the subcutaneous tissue.  The platysmas was opened with electrocautery.  Then I dissected down to the internal jugular vein.  This was dissected posteriorly until I obtained visualization of the common carotid artery.  This was dissected out and then an umbilical tape was placed around the common carotid artery and I loosely applied a Rumel tourniquet.  I then dissected in a periadventitial fashion along the common carotid artery up to the bifurcation.  I then identified the external carotid artery and the superior thyroid artery and I also dissected out the external carotid artery and placed a vessel loop around it.  In continuing the dissection to the internal carotid artery, I identified the facial vein.  This was ligated and then transected, giving me improved exposure of the internal carotid artery.  In the process of this dissection, the hypoglossal nerve was identified. This was directly in the field and required mobilization.  I then dissected out the internal carotid artery until I identified an area of soft tissue in the internal carotid artery.  I dissected slightly distal to this area, and placed a vessel loop. At this point, we gave the patient a therapeutic bolus of Heparin intravenously (roughly 100 units/kg). More heparin was required to maintain an ACT of 250. After waiting 3 minutes, then I clamped the internal carotid artery, external carotid artery and then the common carotid artery.  I then made an arteriotomy in the common carotid artery with a 11 blade, and  extended the arteriotomy with a Potts scissor down into the common carotid artery, then I carried the arteriotomy through the bifurcation into the internal carotid artery until I reached an area that was not diseased.    At this point, I started the endarterectomy in the common carotid artery with a Cytogeneticist and carried this dissection down into the common  carotid artery circumferentially.  Then I transected the plaque at a segment where it was adherent.  I then carried this dissection up into the external carotid artery.  The plaque was extracted by unclamping the external carotid artery and everting the artery.  The dissection was then carried into the internal carotid artery, extracting the remaining portion of the carotid plaque.  I passed the plaque off the field as a specimen. The internal was then blackbled and found to be pulsatile. I elected not to shunt.  I then spent the next 30 minutes removing intimal flaps and loose debris.  Eventually I reached the point where the residual plaque was densely adherent and any further dissection would compromise the integrity of the wall.  After verifying that there was no more loose intimal flaps or debris, I re-interrogated the entirety of this carotid artery.  At this point, I was satisfied that the minimal remaining disease was densely adherent to the wall and wall integrity was intact.  At this point, I then fashioned a bovine pericardial patch for the geometry of this artery and sewed it in place with two running stitch of 6-0 Prolene, one from each end.  Prior to completing this patch angioplasty, I backbled the ICA, CCA, and ECA. Then I instilled heparinized saline in this patched artery and then completed the patch angioplasty in the usual fashion.  First, I released the clamp on the external carotid artery, then I released it on the common carotid artery.  After waiting a few seconds, I then released it on the internal carotid artery.  I then interrogated this patient's arteries with the continuous Doppler.  The audible waveforms in each artery were consistent with the expected characteristics for each artery.  The Sonosite probe was then sterilely draped and used to interrogate the carotid artery in both longitudinal and transverse views.  At this point, I washed out the wound, and placed thrombin and Gelfoam  throughout.  I also gave the patient 30 mg of protamine to reverse his anticoagulation.   After waiting a few minutes, I removed the thrombin and Gelfoam and washed out the wound.  There was no more active bleeding in the surgical site.   I then reapproximated the platysma muscle with a running stitch of 3-0 Vicryl.  The skin was then reapproximated with a running subcuticular 4-0 Monocryl stitch.  The skin was then cleaned, dried and Dermabond was used to reinforce the skin closure.  The patient woke without any problems, neurologically intact.    Victorino Sparrow, MD [facility] DATE OF DICTATION:   08/20/2023

## 2023-08-20 NOTE — Progress Notes (Signed)
Pt arrived from ...PACU.., A/ox ..4.pt denies any pain, MD aware,CCMD called. CHG bath given,no further needs at this time    

## 2023-08-20 NOTE — H&P (Signed)
 Office Note    Patient seen and examined in preop holding.  No complaints. No changes to medication history or physical exam since last seen in clinic. After discussing the risks and benefits of right CEA for asymptomatic carotid artery stenosis, Jack [name] elected to proceed.   Victorino Sparrow MD   HPI: Jack [name] is a 78 y.o. (Sep 03, 1945) male presenting in follow-up with known right-sided ICA stenosis.   Patient has CKD, but we attempted CT angio head and neck in an effort to further define the lesion and assess for candidacy being carotid endarterectomy versus TCAR.  Unfortunately, the scan was unable to be performed due to his creatinine.  He presents today for operative discussion.  On exam, but was doing well, accompanied by his wife.  A native of [place], he has lived in that community his entire life.  Now retired, he enjoys hanging out and 'putting around the house'.  Jack [name] denies symptoms of TIA, stroke, amaurosis since his last visit.  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  States his current GFR is mid 30s. His mother has history of ICA stenosis status post CEA with massive postop stroke.  The pt is  on a statin for cholesterol management - and repatha The pt is  on a daily aspirin.   Other AC:  - The pt is  on medication for hypertension.   The pt is not diabetic.  Tobacco hx:  none  Past Medical History:  Diagnosis Date   Allergy    Arthritis    psoriatic arthritis    Asthma    Atheroembolic kidney disease (HCC)    Atheroembolism    CAD (coronary artery disease)    hx non obst   Cancer (HCC)    basal cell x3 , last one removed - 12/12/2013, R temple area of forehead   Carotid artery occlusion    Chronic kidney disease    renal stones - tx. /w lithotripsy   Complication of anesthesia    GERD (gastroesophageal reflux disease)    HLD (hyperlipidemia)    HTN (hypertension)    Hypoglycemia     Paroxysmal atrial fibrillation (HCC)    PONV (postoperative nausea and vomiting)    Pruritus    Psoriasis    Shortness of breath    after eating , if he gets active, he might have some SOB   Vitamin D deficiency     Past Surgical History:  Procedure Laterality Date   AV FISTULA PLACEMENT  07-2009   Left Brachiocephalic AVF   CARDIAC CATHETERIZATION  2010   COLONOSCOPY  09/15/2012   Colonic polyp status post polypectomy. Mild sigmoid diverticulosis. Small internal hemorrhoids. Otherwise normal colonoscopy    LIGATION OF ARTERIOVENOUS  FISTULA Left 12/30/2013   Procedure: EXCISION OF LEFT ARM ARTERIOVENOUS  FISTULA;  Surgeon: Larina Earthly, MD;  Location: [facility] OR;  Service: Vascular;  Laterality: Left;   LITHOTRIPSY     skin cancer removed     multiple basal cell    Social History   Socioeconomic History   Marital status: Married    Spouse name: Not on file   Number of children: 0   Years of education: Not on file   Highest education level: Not on file  Occupational History   Occupation: Retired from a Education officer, environmental  Tobacco Use   Smoking status: Former    Current packs/day: 0.00    Types: Cigarettes  Quit date: 05/21/1970    Years since quitting: 53.2   Smokeless tobacco: Never   Tobacco comments:    quit 40+ years ago   Vaping Use   Vaping status: Never Used  Substance and Sexual Activity   Alcohol use: No   Drug use: No   Sexual activity: Not on file  Other Topics Concern   Not on file  Social History Narrative   Married, lives with wife; Designer, television/film set.   Lives in [facility]   Social Drivers of Health   Financial Resource Strain: Not on file  Food Insecurity: Not on file  Transportation Needs: Not on file  Physical Activity: Not on file  Stress: Not on file  Social Connections: Not on file  Intimate Partner Violence: Not on file   Family History  Problem Relation Age of Onset   Cancer Mother    Heart disease Father        Before age 25    Asthma Father    Heart disease Brother        After 66 years of age   Coronary artery disease Other        family hx   Colon cancer Neg Hx    Esophageal cancer Neg Hx    Liver cancer Neg Hx    Pancreatic cancer Neg Hx    Rectal cancer Neg Hx    Stomach cancer Neg Hx     Current Facility-Administered Medications  Medication Dose Route Frequency Provider Last Rate Last Admin   0.9 %  sodium chloride infusion   Intravenous Continuous Victorino Sparrow, MD       ceFAZolin (ANCEF) IVPB 2g/100 mL premix  2 g Intravenous 30 min Pre-Op Victorino Sparrow, MD       chlorhexidine (PERIDEX) 0.12 % solution 15 mL  15 mL Mouth/Throat Once Beryle Lathe, MD       Or   Oral care mouth rinse  15 mL Mouth Rinse Once Beryle Lathe, MD       Chlorhexidine Gluconate Cloth 2 % PADS 6 each  6 each Topical Once Victorino Sparrow, MD       And   Chlorhexidine Gluconate Cloth 2 % PADS 6 each  6 each Topical Once Victorino Sparrow, MD       lactated ringers infusion   Intravenous Continuous Beryle Lathe, MD        Allergies  Allergen Reactions   Hydrocodone Nausea Only   Oxycodone Nausea And Vomiting   Eliquis [Apixaban]     Itching and rash   Hydrochlorothiazide     anxious   Hytrin [Terazosin Hcl]     Heart races  Pt's wife unfamiliar (01/11/23)   Pradaxa [Dabigatran Etexilate Mesylate]     Itching and rash    Sulfa Antibiotics Itching   Xarelto [Rivaroxaban]     Itching and rash      REVIEW OF SYSTEMS:  [X]  denotes positive finding, [ ]  denotes negative finding Cardiac  Comments:  Chest pain or chest pressure:    Shortness of breath upon exertion:    Short of breath when lying flat:    Irregular heart rhythm:        Vascular    Pain in calf, thigh, or hip brought on by ambulation:    Pain in feet at night that wakes you up from your sleep:     Blood clot in your veins:    Leg swelling:  Pulmonary    Oxygen at home:    Productive cough:     Wheezing:          Neurologic    Sudden weakness in arms or legs:     Sudden numbness in arms or legs:     Sudden onset of difficulty speaking or slurred speech:    Temporary loss of vision in one eye:     Problems with dizziness:         Gastrointestinal    Blood in stool:     Vomited blood:         Genitourinary    Burning when urinating:     Blood in urine:        Psychiatric    Major depression:         Hematologic    Bleeding problems:    Problems with blood clotting too easily:        Skin    Rashes or ulcers:        Constitutional    Fever or chills:      PHYSICAL EXAMINATION:  There were no vitals filed for this visit.  General:  WDWN in NAD; vital signs documented above Gait: Not observed HENT: WNL, normocephalic Pulmonary: normal non-labored breathing , without wheezing Cardiac: regular HR Abdomen: soft, NT, no masses Skin: without rashes Vascular Exam/Pulses:  Right Left  Radial 2+ (normal) 2+ (normal)                       Extremities: without ischemic changes, without Gangrene , without cellulitis; without open wounds;  Musculoskeletal: no muscle wasting or atrophy  Neurologic: A&O X 3;  No focal weakness or paresthesias are detected Psychiatric:  The pt has Normal affect.   Non-Invasive Vascular Imaging:   Right Carotid Findings:  +----------+--------+--------+--------+-------------------+----------------  ----+           PSV cm/sEDV cm/sStenosisPlaque Description Comments               +----------+--------+--------+--------+-------------------+----------------  ----+  CCA Prox  55      9                                                         +----------+--------+--------+--------+-------------------+----------------  ----+  CCA Distal46      9               heterogenous and                                                            calcific                                   +----------+--------+--------+--------+-------------------+----------------  ----+  ICA Prox  477     167     80-99%  heterogenous and  irregular                                 +----------+--------+--------+--------+-------------------+----------------  ----+  ICA Mid   296     71                                                        +----------+--------+--------+--------+-------------------+----------------  ----+  ICA Distal168     38                                 post-stenotic                                                               turbulence             +----------+--------+--------+--------+-------------------+----------------  ----+  ECA      96      9                                                         +----------+--------+--------+--------+-------------------+----------------  ----+   +----------+--------+-------+---------+-------------------+           PSV cm/sEDV cmsDescribe Arm Pressure (mmHG)  +----------+--------+-------+---------+-------------------+  [id number]           Turbulent                     +----------+--------+-------+---------+-------------------+   +---------+--------+--+--------+--+---------+  VertebralPSV cm/s48EDV cm/s14Antegrade  +---------+--------+--+--------+--+---------+      Left Carotid Findings:  +----------+--------+-------+--------+--------------------------------+----  ----+           PSV cm/sEDV    StenosisPlaque Description               Comments                   cm/s                                                      +----------+--------+-------+--------+--------------------------------+----  ----+  CCA Prox  58      9                                               tortuous  +----------+--------+-------+--------+--------------------------------+----  ----+  CCA  Distal65      15                                                        +----------+--------+-------+--------+--------------------------------+----  ----+  ICA Prox  112     37     1-39%   smooth, heterogenous and                                                    calcific                                   +----------+--------+-------+--------+--------------------------------+----  ----+  ICA Mid   96      27                                                        +----------+--------+-------+--------+--------------------------------+----  ----+  ICA Distal59      16                                                        +----------+--------+-------+--------+--------------------------------+----  ----+  ECA      114     11             heterogenous and calcific                  +----------+--------+-------+--------+--------------------------------+----  ----+   +----------+--------+--------+---------+-------------------+           PSV cm/sEDV cm/sDescribe Arm Pressure (mmHG)  +----------+--------+--------+---------+-------------------+  [id number]            Turbulent                     +----------+--------+--------+---------+-------------------+   +---------+--------+--+--------+--+---------+  VertebralPSV cm/s37EDV cm/s12Antegrade  +---------+--------+--+--------+--+---------+         ASSESSMENT/PLAN: Jack [name] is a 78 y.o. male presenting with right-sided, asymptomatic, critical ICA stenosis.  I was present with our ultrasonographer when she uncinated the right sided internal carotid artery.  There was a bulb lesion that extended 2 cm into the internal carotid artery.  The lesion was at the angle of the mandible.  Pertinent I had a long discussion regarding the above.  We discussed that with critical ICA stenosis, he has an 11% risk of stroke over the next 5 years.  With intervention, this is decreased to  5%.  He is healthy, and should have no problem living another 5 years.    Being that Jack [name] was unable to have a contrasted CT scan, I offered him right-sided carotid endarterectomy for asymptomatic right ICA stenosis.  We discussed the risks and benefits at length.  All questions were answered.  After discussing these risks and benefits of surgery, Jack [name] elected to proceed.  He has been cleared by his cardiologist. Doristine Church has significant bruising, therefore I will perform the surgery on aspirin and statin, whereas a usually add plavix.   Victorino Sparrow, MD [facility] [phone]

## 2023-08-20 NOTE — Plan of Care (Signed)
   Problem: Education: Goal: Knowledge of General Education information will improve Description Including pain rating scale, medication(s)/side effects and non-pharmacologic comfort measures Outcome: Progressing   Problem: Clinical Measurements: Goal: Will remain free from infection Outcome: Progressing  Goal: Diagnostic test results will improve Outcome: Progressing Goal: Respiratory complications will improve Outcome: Progressing Goal: Cardiovascular complication will be avoided Outcome: Progressing   Problem: Activity: Goal: Risk for activity intolerance will decrease Outcome: Progressing

## 2023-08-21 ENCOUNTER — Encounter (HOSPITAL_COMMUNITY): Payer: Self-pay | Admitting: Vascular Surgery

## 2023-08-21 LAB — CBC
HCT: 37.2 % — ABNORMAL LOW (ref 39.0–52.0)
Hemoglobin: 12.6 g/dL — ABNORMAL LOW (ref 13.0–17.0)
MCH: 30.7 pg (ref 26.0–34.0)
MCHC: 33.9 g/dL (ref 30.0–36.0)
MCV: 90.5 fL (ref 80.0–100.0)
Platelets: 212 10*3/uL (ref 150–400)
RBC: 4.11 MIL/uL — ABNORMAL LOW (ref 4.22–5.81)
RDW: 14.2 % (ref 11.5–15.5)
WBC: 10.6 10*3/uL — ABNORMAL HIGH (ref 4.0–10.5)
nRBC: 0 % (ref 0.0–0.2)

## 2023-08-21 LAB — BASIC METABOLIC PANEL WITH GFR
Anion gap: 8 (ref 5–15)
BUN: 32 mg/dL — ABNORMAL HIGH (ref 8–23)
CO2: 21 mmol/L — ABNORMAL LOW (ref 22–32)
Calcium: 8.2 mg/dL — ABNORMAL LOW (ref 8.9–10.3)
Chloride: 109 mmol/L (ref 98–111)
Creatinine, Ser: 1.99 mg/dL — ABNORMAL HIGH (ref 0.61–1.24)
GFR, Estimated: 34 mL/min — ABNORMAL LOW (ref >60)
Glucose, Bld: 183 mg/dL — ABNORMAL HIGH (ref 70–99)
Potassium: 4 mmol/L (ref 3.5–5.1)
Sodium: 138 mmol/L (ref 135–145)

## 2023-08-21 NOTE — Progress Notes (Addendum)
  Progress Note    08/21/2023 9:14 AM 1 Day Post-Op  Subjective:  No neuro events overnight.  Ready for discharge home   Vitals:   08/21/23 0400 08/21/23 0748  BP: (!) 150/85 117/84  Pulse: 73 73  Resp: 11 (!) 21  Temp:    SpO2: 98% 93%   Physical Exam: Lungs:  non labored Incisions:  R neck incision c/d/I without hematoma Extremities:  moving all ext well Neurologic: A&O; CN grossly intact  CBC    Component Value Date/Time   WBC 10.6 (H) 08/21/2023 0430   RBC 4.11 (L) 08/21/2023 0430   HGB 12.6 (L) 08/21/2023 0430   HCT 37.2 (L) 08/21/2023 0430   PLT 212 08/21/2023 0430   MCV 90.5 08/21/2023 0430   MCH 30.7 08/21/2023 0430   MCHC 33.9 08/21/2023 0430   RDW 14.2 08/21/2023 0430   LYMPHSABS 2.4 01/11/2023 1245   MONOABS 1.3 (H) 01/11/2023 1245   EOSABS 0.0 01/11/2023 1245   BASOSABS 0.0 01/11/2023 1245    BMET    Component Value Date/Time   NA 138 08/21/2023 0430   K 4.0 08/21/2023 0430   CL 109 08/21/2023 0430   CO2 21 (L) 08/21/2023 0430   GLUCOSE 183 (H) 08/21/2023 0430   BUN 32 (H) 08/21/2023 0430   CREATININE 1.99 (H) 08/21/2023 0430   CALCIUM 8.2 (L) 08/21/2023 0430   GFRNONAA 34 (L) 08/21/2023 0430   GFRAA 56 (L) 12/24/2013 1316    INR    Component Value Date/Time   INR 1.0 08/20/2023 0720     Intake/Output Summary (Last 24 hours) at 08/21/2023 0914 Last data filed at 08/21/2023 0751 Gross per 24 hour  Intake 2929.75 ml  Output 1460 ml  Net 1469.75 ml     Assessment/Plan:  77 y.o. male is s/p R CEA 1 Day Post-Op   Subjectively, no stroke like symptoms overnight R neck incision is well appearing without hematoma Continue aspirin and Repatha Office will arrange incision check in 2-3 weeks;  Patient does not want to be prescribed pain medication   Emilie Rutter, PA-C [facility] [phone] 08/21/2023 9:14 AM  Patient seen and examined this morning.  Doing well No deficits No chest pain.  Chest pain overnight  was described as acid reflux.  Tropes negative yesterday after a run of A-fib.  He is aware this is not uncommon after surgery.  Has allergies to anticoagulation, therefore is not anticoagulated.  He is rate controlled.  Sinus rhythm this morning.  Home

## 2023-08-21 NOTE — Discharge Instructions (Signed)
   [facility]  Discharge Instructions   Carotid Endarterectomy (CEA)  Please refer to the following instructions for your post-procedure care. Your surgeon or physician assistant will discuss any changes with you.  Activity  You are encouraged to walk as much as you can. You can slowly return to normal activities but must avoid strenuous activity and heavy lifting until your doctor tell you it's OK. Avoid activities such as vacuuming or swinging a golf club. You can drive after one week if you are comfortable and you are no longer taking prescription pain medications. It is normal to feel tired for serval weeks after your surgery. It is also normal to have difficulty with sleep habits, eating, and bowel movements after surgery. These will go away with time.  Bathing/Showering  You may shower after you come home. Do not soak in a bathtub, hot tub, or swim until the incision heals completely.  Incision Care  Shower every day. Clean your incision with mild soap and water. Pat the area dry with a clean towel. You do not need a bandage unless otherwise instructed. Do not apply any ointments or creams to your incision. You may have skin glue on your incision. Do not peel it off. It will come off on its own in about one week. Your incision may feel thickened and raised for several weeks after your surgery. This is normal and the skin will soften over time. For Men Only: It's OK to shave around the incision but do not shave the incision itself for 2 weeks. It is common to have numbness under your chin that could last for several months.  Diet  Resume your normal diet. There are no special food restrictions following this procedure. A low fat/low cholesterol diet is recommended for all patients with vascular disease. In order to heal from your surgery, it is CRITICAL to get adequate nutrition. Your body requires vitamins, minerals, and protein. Vegetables are the best  source of vitamins and minerals. Vegetables also provide the perfect balance of protein. Processed food has little nutritional value, so try to avoid this.        Medications  Resume taking all of your medications unless your doctor or physician assistant tells you not to. If your incision is causing pain, you may take over-the- counter pain relievers such as acetaminophen (Tylenol). If you were prescribed a stronger pain medication, please be aware these medications can cause nausea and constipation. Prevent nausea by taking the medication with a snack or meal. Avoid constipation by drinking plenty of fluids and eating foods with a high amount of fiber, such as fruits, vegetables, and grains. Do not take Tylenol if you are taking prescription pain medications.  Follow Up  Our office will schedule a follow up appointment 2-3 weeks following discharge.  Please call us immediately for any of the following conditions  Increased pain, redness, drainage (pus) from your incision site. Fever of 101 degrees or higher. If you should develop stroke (slurred speech, difficulty swallowing, weakness on one side of your body, loss of vision) you should call 911 and go to the nearest emergency room.  Reduce your risk of vascular disease:  Stop smoking. If you would like help call [facility] at 1-800-QUIT-NOW ((910) 853-8047) or [place] at [phone]. Manage your cholesterol Maintain a desired weight Control your diabetes Keep your blood pressure down  If you have any questions, please call the office at [phone].

## 2023-08-21 NOTE — Discharge Summary (Signed)
 Discharge Summary     Jack [name] 06-29-1945 78 y.o. male  [id number]  Admission Date: 08/20/2023  Discharge Date: 08/21/23  Physician: Dr. Karin Lieu  Admission Diagnosis: Stenosis of right carotid artery [I65.21] Carotid artery stenosis [I65.29]  Discharge Day services:    See progress note 08/21/23  Hospital Course:  Jack [name] is a 78 year old male who was brought in as an outpatient underwent right carotid endarterectomy with bovine patch angioplasty by Dr. Sherral Hammers on 08/20/2023 due to high-grade asymptomatic stenosis.  He tolerated procedure and was admitted to the hospital postoperatively.  Overnight he did have some chest pain however this was relieved with elevation of the head of the bed and pain medication.  Pain was similar to episodes he experiences from time to time at home after eating and laying flat too quickly.  He however had a short run of VT on the monitor.  We will refer him back to his primary cardiologist for short-term follow-up after discharge.  Postoperative day #1 rest pain has completely resolved.  Neuroexam remains at baseline and he did not experience any neurological events since surgery.  After ambulating he feels ready for discharge home.  He will follow-up in the office in 2 to 3 weeks for incision check.  He will continue his aspirin and statin daily.  We discussed a narcotic pain medication for postoperative pain control however he refused this.  He was discharged home in stable condition.   Recent Labs    08/20/23 0720 08/21/23 0430  NA 142 138  K 3.9 4.0  CL 107 109  CO2 25 21*  GLUCOSE 103* 183*  BUN 30* 32*  CALCIUM 9.2 8.2*   Recent Labs    08/20/23 0720 08/21/23 0430  WBC 7.6 10.6*  HGB 16.1 12.6*  HCT 47.3 37.2*  PLT 247 212   Recent Labs    08/20/23 0720  INR 1.0       Discharge Diagnosis:  Stenosis of right carotid artery [I65.21] Carotid artery stenosis [I65.29]  Secondary Diagnosis: Patient Active Problem List    Diagnosis Date Noted   Carotid artery stenosis 08/20/2023   Soreness-type pain-Left arm  01/01/2014   Swelling of limb-Left arm  01/01/2014   Visit for wound check 01/01/2014   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, implant, and graft 02/06/2011   PAF (paroxysmal atrial fibrillation) (HCC) 01/31/2011   GERD 09/16/2009   Disorder resulting from impaired renal function 07/15/2009   HYPERTENSION, BENIGN 06/28/2009   CAD, NATIVE VESSEL 06/28/2009   ATHEROEMBOLISM 06/28/2009   INTERMITTENT CLAUDICATION, BILATERAL 06/27/2009   Past Medical History:  Diagnosis Date   Allergy    Arthritis    psoriatic arthritis    Asthma    Atheroembolic kidney disease (HCC)    Atheroembolism    CAD (coronary artery disease)    hx non obst   Cancer (HCC)    basal cell x3 , last one removed - 12/12/2013, R temple area of forehead   Carotid artery occlusion    Chronic kidney disease    renal stones - tx. /w lithotripsy   Complication of anesthesia    GERD (gastroesophageal reflux disease)    HLD (hyperlipidemia)    HTN (hypertension)    Hypoglycemia    Paroxysmal atrial fibrillation (HCC)    PONV (postoperative nausea and vomiting)    Pruritus    Psoriasis    Shortness of breath    after eating , if he gets active, he might have some  SOB   Vitamin D deficiency     Allergies as of 08/21/2023       Reactions   Hydrocodone Nausea Only   Oxycodone Nausea And Vomiting   Eliquis [apixaban]    Itching and rash   Hydrochlorothiazide    anxious   Hytrin [terazosin Hcl]    Heart races Pt's wife unfamiliar (01/11/23)   Pradaxa [dabigatran Etexilate Mesylate]    Itching and rash    Sulfa Antibiotics Itching   Xarelto [rivaroxaban]    Itching and rash         Medication List     TAKE these medications    acetaminophen 500 MG tablet Commonly known as: TYLENOL Take 500 mg by mouth every 6 (six) hours as needed for mild pain (pain score 1-3), moderate pain (pain score 4-6) or  headache.   amLODipine 10 MG tablet Commonly known as: NORVASC Take 5 mg by mouth at bedtime.   aspirin EC 81 MG tablet Take 81 mg by mouth daily. Swallow whole.   betamethasone dipropionate 0.05 % lotion Apply 1 application  topically 2 (two) times daily as needed (Psoriasis).   CoQ-10 100 MG capsule Take 100 mg by mouth daily.   Fish Oil 1000 MG Caps Take 1,000 mg by mouth daily.   flecainide 100 MG tablet Commonly known as: TAMBOCOR Take 1 tablet (100 mg total) by mouth 2 (two) times daily. What changed: how much to take   isosorbide mononitrate 30 MG 24 hr tablet Commonly known as: IMDUR Take 1 tablet (30 mg total) by mouth daily.   KENALOG IJ Inject 1 application as directed as directed. 3-4 months   labetalol 100 MG tablet Commonly known as: NORMODYNE Take 100 mg by mouth 2 (two) times daily.   loratadine 10 MG tablet Commonly known as: CLARITIN Take 10 mg by mouth daily.   nitroGLYCERIN 0.4 MG SL tablet Commonly known as: NITROSTAT Place 1 tablet (0.4 mg total) under the tongue every 5 (five) minutes as needed for chest pain.   pravastatin 40 MG tablet Commonly known as: PRAVACHOL Take 40 mg by mouth daily.   Repatha SureClick 140 MG/ML Soaj Generic drug: Evolocumab Inject 140 mg into the skin every 14 (fourteen) days.   sodium chloride 0.65 % Soln nasal spray Commonly known as: OCEAN Place 1 spray into both nostrils at bedtime as needed for congestion.   triamcinolone cream 0.1 % Commonly known as: KENALOG Apply topically 2 (two) times daily. What changed:  how much to take when to take this reasons to take this   Vitamin D-3 125 MCG (5000 UT) Tabs Take 5,000 Units by mouth daily.         Discharge Instructions:   [facility] Discharge Instructions Carotid Endarterectomy (CEA)  Please refer to the following instructions for your post-procedure care. Your surgeon or physician assistant will discuss any  changes with you.  Activity  You are encouraged to walk as much as you can. You can slowly return to normal activities but must avoid strenuous activity and heavy lifting until your doctor tell you it's OK. Avoid activities such as vacuuming or swinging a golf club. You can drive after one week if you are comfortable and you are no longer taking prescription pain medications. It is normal to feel tired for serval weeks after your surgery. It is also normal to have difficulty with sleep habits, eating, and bowel movements after surgery. These will go away with time.  Bathing/Showering  You may shower after you come home. Do not soak in a bathtub, hot tub, or swim until the incision heals completely.  Incision Care  Shower every day. Clean your incision with mild soap and water. Pat the area dry with a clean towel. You do not need a bandage unless otherwise instructed. Do not apply any ointments or creams to your incision. You may have skin glue on your incision. Do not peel it off. It will come off on its own in about one week. Your incision may feel thickened and raised for several weeks after your surgery. This is normal and the skin will soften over time. For Men Only: It's OK to shave around the incision but do not shave the incision itself for 2 weeks. It is common to have numbness under your chin that could last for several months.  Diet  Resume your normal diet. There are no special food restrictions following this procedure. A low fat/low cholesterol diet is recommended for all patients with vascular disease. In order to heal from your surgery, it is CRITICAL to get adequate nutrition. Your body requires vitamins, minerals, and protein. Vegetables are the best source of vitamins and minerals. Vegetables also provide the perfect balance of protein. Processed food has little nutritional value, so try to avoid this.  Medications  Resume taking all of your medications unless your doctor or  physician assistant tells you not to.  If your incision is causing pain, you may take over-the- counter pain relievers such as acetaminophen (Tylenol). If you were prescribed a stronger pain medication, please be aware these medications can cause nausea and constipation.  Prevent nausea by taking the medication with a snack or meal. Avoid constipation by drinking plenty of fluids and eating foods with a high amount of fiber, such as fruits, vegetables, and grains. Do not take Tylenol if you are taking prescription pain medications.  Follow Up  Our office will schedule a follow up appointment 2-3 weeks following discharge.  Please call us immediately for any of the following conditions  Increased pain, redness, drainage (pus) from your incision site. Fever of 101 degrees or higher. If you should develop stroke (slurred speech, difficulty swallowing, weakness on one side of your body, loss of vision) you should call 911 and go to the nearest emergency room.  Reduce your risk of vascular disease:  Stop smoking. If you would like help call [facility] at 1-800-QUIT-NOW (3155590054) or  at ([phone]. Manage your cholesterol Maintain a desired weight Control your diabetes Keep your blood pressure down  If you have any questions, please call the office at [phone].  Disposition: home  Patient's condition: is Good  Follow up: 1. Dr. VVS in 2 weeks.   Emilie Rutter, PA-C [facility] [phone]   --- For [facility] Registry use ---   Modified Rankin score at D/C (0-6): 0  IV medication needed for:  1. Hypertension: No 2. Hypotension: No  Post-op Complications: No  1. Post-op CVA or TIA: No  If yes: Event classification (right eye, left eye, right cortical, left cortical, verterobasilar, other):   If yes: Timing of event (intra-op, <6 hrs post-op, >=6 hrs post-op, unknown):   2. CN injury: No  If yes: CN  injuried   3. Myocardial  infarction: No  If yes: Dx by (EKG or clinical, Troponin):   4.  CHF: No  5.  Dysrhythmia (new): No  6. Wound infection: No  7. Reperfusion symptoms: No  8. Return  to OR: No  If yes: return to OR for (bleeding, neurologic, other CEA incision, other):   Discharge medications: Statin use:  Yes ASA use:  Yes   Beta blocker use:  Yes ACE-Inhibitor use:  No  ARB use:  No CCB use: Yes P2Y12 Antagonist use: No, [ ]  Plavix, [ ]  Plasugrel, [ ]  Ticlopinine, [ ]  Ticagrelor, [ ]  Other, [ ]  No for medical reason, [ ]  Non-compliant, [ ]  Not-indicated Anti-coagulant use:  No, [ ]  Warfarin, [ ]  Rivaroxaban, [ ]  Dabigatran,

## 2023-08-21 NOTE — Progress Notes (Signed)
 Discharge instructions reviewed with pt and his wife.  Copy of instructions given to pt. No new scripts, no changes in medications.  Pt will be d/c'd via wheelchair with belongings, with his wife and will be        escorted by hospital volunteer.   Morrell Fluke,RN SWOT

## 2023-08-21 NOTE — Progress Notes (Signed)
 Pt c/o of chest discomfort level 5, denies having discomfort similar from previous shift. Pt refused NTG at present time and agreeable to take Tramadol 50 mg for pain, and place O2 on pt at 2l for supplemental support. After about 10 minutes remaining at pt bedside chest pain level to 0. Pt spouse at bedside. Labs obtained as ordered via A-line. Will cont to monitor pt, VSs unchanged and in computer.

## 2023-08-21 NOTE — Progress Notes (Signed)
   08/21/23 1110  TOC Brief Assessment  Insurance and Status Reviewed  Patient has primary care physician Yes  Home environment has been reviewed home w/ spouse  Prior level of function: independent  Prior/Current Home Services No current home services  Social Drivers of Health Review SDOH reviewed no interventions necessary  Readmission risk has been reviewed Yes  Transition of care needs no transition of care needs at this time    Pt s/p CEA, stable for transition home, family to transport home, no HH or DME needs noted.

## 2023-08-23 ENCOUNTER — Other Ambulatory Visit (HOSPITAL_BASED_OUTPATIENT_CLINIC_OR_DEPARTMENT_OTHER): Payer: Self-pay

## 2023-08-23 LAB — POCT ACTIVATED CLOTTING TIME
Activated Clotting Time: 222 s
Activated Clotting Time: 227 s
Activated Clotting Time: 245 s
Activated Clotting Time: 245 s
Activated Clotting Time: 262 s
Activated Clotting Time: 124 s

## 2023-08-23 MED ORDER — FLECAINIDE ACETATE 50 MG PO TABS
50.0000 mg | ORAL_TABLET | Freq: Two times a day (BID) | ORAL | 1 refills | Status: DC
  Filled 2023-08-23: qty 180, 90d supply, fill #0
  Filled 2023-11-29: qty 160, 80d supply, fill #1
  Filled 2023-11-29: qty 20, 10d supply, fill #1

## 2023-08-23 MED ORDER — PRAVASTATIN SODIUM 40 MG PO TABS
40.0000 mg | ORAL_TABLET | Freq: Every day | ORAL | 4 refills | Status: DC
  Filled 2023-08-23: qty 90, 90d supply, fill #0

## 2023-08-23 MED ORDER — AMLODIPINE BESYLATE 5 MG PO TABS
5.0000 mg | ORAL_TABLET | Freq: Every day | ORAL | 4 refills | Status: DC
  Filled 2023-08-23: qty 90, 90d supply, fill #0

## 2023-08-26 ENCOUNTER — Other Ambulatory Visit (HOSPITAL_BASED_OUTPATIENT_CLINIC_OR_DEPARTMENT_OTHER): Payer: Self-pay

## 2023-08-27 DIAGNOSIS — E538 Deficiency of other specified B group vitamins: Secondary | ICD-10-CM | POA: Diagnosis not present

## 2023-08-27 DIAGNOSIS — I6521 Occlusion and stenosis of right carotid artery: Secondary | ICD-10-CM | POA: Diagnosis not present

## 2023-08-27 DIAGNOSIS — R42 Dizziness and giddiness: Secondary | ICD-10-CM | POA: Diagnosis not present

## 2023-08-27 DIAGNOSIS — R531 Weakness: Secondary | ICD-10-CM | POA: Diagnosis not present

## 2023-08-27 DIAGNOSIS — I480 Paroxysmal atrial fibrillation: Secondary | ICD-10-CM | POA: Diagnosis not present

## 2023-08-30 ENCOUNTER — Other Ambulatory Visit (HOSPITAL_BASED_OUTPATIENT_CLINIC_OR_DEPARTMENT_OTHER): Payer: Self-pay

## 2023-09-03 DIAGNOSIS — E538 Deficiency of other specified B group vitamins: Secondary | ICD-10-CM | POA: Diagnosis not present

## 2023-09-05 ENCOUNTER — Ambulatory Visit (INDEPENDENT_AMBULATORY_CARE_PROVIDER_SITE_OTHER): Admitting: Physician Assistant

## 2023-09-05 VITALS — BP 139/83 | HR 61 | Temp 98.6°F | Ht 70.0 in | Wt 147.0 lb

## 2023-09-05 DIAGNOSIS — I6521 Occlusion and stenosis of right carotid artery: Secondary | ICD-10-CM

## 2023-09-05 NOTE — Progress Notes (Signed)
 POST OPERATIVE OFFICE NOTE    CC:  F/u for surgery  HPI:  Jack [name] is a 78 y.o. male who is here for postop visit.  He recently underwent right carotid endarterectomy on 08/20/2023 by Dr. Rosalva Comber.  This was done for asymptomatic critical right ICA stenosis.  Pt returns today for follow up.  Pt states he is doing okay at today's visit.  He says for a few days after surgery he had throbbing pain in his molars on the right.  This pain has gone away now.  He has a little bit of residual soreness and numbness at his jawline on the right.  He denies any strokelike symptoms such as slurred speech, facial droop, sudden visual changes, sudden weakness/numbness.  He is taking his daily aspirin and statin.  He is also on Repatha   Allergies  Allergen Reactions   Hydrocodone Nausea Only   Oxycodone Nausea And Vomiting   Eliquis [Apixaban]     Itching and rash   Hydrochlorothiazide     anxious   Hytrin [Terazosin Hcl]     Heart races  Pt's wife unfamiliar (01/11/23)   Pradaxa [Dabigatran Etexilate Mesylate]     Itching and rash    Sulfa Antibiotics Itching   Xarelto [Rivaroxaban]     Itching and rash     Current Outpatient Medications  Medication Sig Dispense Refill   acetaminophen (TYLENOL) 500 MG tablet Take 500 mg by mouth every 6 (six) hours as needed for mild pain (pain score 1-3), moderate pain (pain score 4-6) or headache.     amLODipine (NORVASC) 10 MG tablet Take 5 mg by mouth at bedtime.     amLODipine (NORVASC) 5 MG tablet Take 1 tablet (5 mg total) by mouth daily for blood pressure 90 tablet 4   aspirin EC 81 MG tablet Take 81 mg by mouth daily. Swallow whole.     betamethasone dipropionate 0.05 % lotion Apply 1 application  topically 2 (two) times daily as needed (Psoriasis).     Cholecalciferol (VITAMIN D-3) 125 MCG (5000 UT) TABS Take 5,000 Units by mouth daily.     Coenzyme Q10 (COQ-10) 100 MG capsule Take 100 mg by mouth daily.     Evolocumab (REPATHA SURECLICK) 140  MG/ML SOAJ Inject 140 mg into the skin every 14 (fourteen) days. 6 mL 3   flecainide (TAMBOCOR) 100 MG tablet Take 1 tablet (100 mg total) by mouth 2 (two) times daily. (Patient taking differently: Take 50 mg by mouth 2 (two) times daily.) 180 tablet 3   flecainide (TAMBOCOR) 50 MG tablet Take 1 (one)  Tablet by mouth two times daily 180 tablet 1   isosorbide mononitrate (IMDUR) 30 MG 24 hr tablet Take 1 tablet (30 mg total) by mouth daily. 90 tablet 3   labetalol (NORMODYNE) 100 MG tablet Take 100 mg by mouth 2 (two) times daily.     loratadine (CLARITIN) 10 MG tablet Take 10 mg by mouth daily.     Omega-3 Fatty Acids (FISH OIL) 1000 MG CAPS Take 1,000 mg by mouth daily.     pravastatin (PRAVACHOL) 40 MG tablet Take 40 mg by mouth daily.      pravastatin (PRAVACHOL) 40 MG tablet Take 1 tablet (40 mg total) by mouth daily. 90 tablet 4   sodium chloride (OCEAN) 0.65 % SOLN nasal spray Place 1 spray into both nostrils at bedtime as needed for congestion.     Triamcinolone Acetonide (KENALOG IJ) Inject 1 application as directed as directed.  3-4 months     triamcinolone cream (KENALOG) 0.1 % Apply topically 2 (two) times daily. (Patient taking differently: Apply 1 Application topically 2 (two) times daily as needed (Itching).) 454 g 1   nitroGLYCERIN (NITROSTAT) 0.4 MG SL tablet Place 1 tablet (0.4 mg total) under the tongue every 5 (five) minutes as needed for chest pain. 25 tablet 3   No current facility-administered medications for this visit.     ROS:  See HPI  Physical Exam:  Incision: Right sided neck incision healing appropriately without hematoma, drainage, or dehiscence Extremities: Palpable radial pulses bilaterally.  Moving all extremities equally Neuro: No slurred speech, lip droop, or tongue deviation    Assessment/Plan:  This is a 78 y.o. male who is here for a postop visit  - The patient's right sided neck incision is almost nearly healed without any signs of infection or  hematoma - He does have some residual soreness and numbness around his right jawline.  I have encouraged the patient that this should go away with time - He did have some throbbing pain of his molars for a couple days after surgery, which has now gone away.  This was likely due to intubation  - He denies any strokelike symptoms.  He has no neurological deficits on exam. - Recommend that he continue daily aspirin and statin.  He should also continue his Repatha.  He can follow-up with our office in 3 months with repeat carotid duplex   Deneise Finlay, PA-C [facility] [phone]   Clinic MD:  Rosalva Comber

## 2023-09-09 ENCOUNTER — Ambulatory Visit: Payer: HMO | Attending: Cardiovascular Disease | Admitting: Cardiovascular Disease

## 2023-09-09 ENCOUNTER — Encounter: Payer: Self-pay | Admitting: Cardiovascular Disease

## 2023-09-09 VITALS — BP 112/72 | HR 53 | Ht 70.0 in | Wt 161.6 lb

## 2023-09-09 DIAGNOSIS — I480 Paroxysmal atrial fibrillation: Secondary | ICD-10-CM | POA: Diagnosis not present

## 2023-09-09 DIAGNOSIS — E7800 Pure hypercholesterolemia, unspecified: Secondary | ICD-10-CM | POA: Diagnosis not present

## 2023-09-09 DIAGNOSIS — I25118 Atherosclerotic heart disease of native coronary artery with other forms of angina pectoris: Secondary | ICD-10-CM | POA: Diagnosis not present

## 2023-09-09 DIAGNOSIS — I6523 Occlusion and stenosis of bilateral carotid arteries: Secondary | ICD-10-CM | POA: Diagnosis not present

## 2023-09-09 DIAGNOSIS — I10 Essential (primary) hypertension: Secondary | ICD-10-CM | POA: Diagnosis not present

## 2023-09-09 NOTE — Patient Instructions (Signed)
 Medication Instructions:  Your physician has recommended you make the following change in your medication:  1-STOP pravastatin   *If you need a refill on your cardiac medications before your next appointment, please call your pharmacy*  Lab Work: If you have labs (blood work) drawn today and your tests are completely normal, you will receive your results only by: MyChart Message (if you have MyChart) OR A paper copy in the mail If you have any lab test that is abnormal or we need to change your treatment, we will call you to review the results.  Testing/Procedures: None ordered today.  Follow-Up: At [facility], you and your health needs are our priority.  As part of our continuing mission to provide you with exceptional heart care, our providers are all part of one team.  This team includes your primary Cardiologist (physician) and Advanced Practice Providers or APPs (Physician Assistants and Nurse Practitioners) who all work together to provide you with the care you need, when you need it.  Your next appointment:   12 year(s)  Provider:   Antoinette Batman, MD     We recommend signing up for the patient portal called "MyChart".  Sign up information is provided on this After Visit Summary.  MyChart is used to connect with patients for Virtual Visits (Telemedicine).  Patients are able to view lab/test results, encounter notes, upcoming appointments, etc.  Non-urgent messages can be sent to your provider as well.   To learn more about what you can do with MyChart, go to [email or web address].   Other Instructions       1st Floor: - Lobby - Registration  - Pharmacy  - Lab - Cafe  2nd Floor: - PV Lab - Diagnostic Testing (echo, CT, nuclear med)  3rd Floor: - Vacant  4th Floor: - TCTS (cardiothoracic surgery) - AFib Clinic - Structural Heart Clinic - Vascular Surgery  - Vascular Ultrasound  5th Floor: - HeartCare Cardiology (general and EP) - Clinical  Pharmacy for coumadin, hypertension, lipid, weight-loss medications, and med management appointments    Valet parking services will be available as well.

## 2023-09-09 NOTE — Progress Notes (Signed)
 Chief Complaint  Patient presents with   Follow-up    CAD   History of Present Illness:  78 yo male with history of carotid artery disease, chronic kidney disease, CAD, paroxysmal atrial fibrillation, HTN and hyperlipidemia here today for follow up. I saw him as a new patient 02/20/18 for evaluation of atrial fibrillation. He had a cardiac cath in 2010 and was found to have mild CAD. He was suspected to have atheroemboli during the cath and had worsened renal insufficiency post cath. He had not been on coumadin. Echo in 2012 showed normal LV systolic function. He had an AV fistula placed back in 2011 and this was removed in 2015 as he never progressed to ESRD. He was found to have atrial fibrillation in 2019. He did not tolerate Eliquis , Pradaxa  or Xarelto  due to a rash. He was seen in EP clinic by Dr. Nunzio Belch and was started on Flecainide . He was admitted to [facility] on 01/26/20 with slurred speech, swallowing trouble. He thought he was having a stroke. Head CT with no bleeding. Brain MRI with no evidence of stroke per pt. Records from [facility]: EKG 01/26/20 with NSR. CT head 01/26/20; no acute bleeding or infarct. Echo 01/26/20; LVEF=55-60%. Trace AI, MR. Carotid artery dopplers April 2023 with moderate RICA stenosis and mild LICA stensosis. He has refused to take coumadin and does not wish to try any of the DOACs again. He has infrequent episodes of atrial fibrillation and when he does he takes an extra Flecainide  and the episodes resolve after an hour or two. He was seen in our office June 2024 by Dr. Renna Cary and c/o chest pain. Nuclear stress test low risk without ischemia in June 2024. Carotid artery dopplers April 2024 with stable moderate bilateral carotid artery disease. I saw him in July 2024 and he c/o fatigue and described occasional chest pain at rest and with exertion. He did not wish to consider a cardiac cath due to prior complications during cath and fear of worsening of his renal  function post cath. Ranexa  was added at the visit in July 2024 but stopped and changed to Imdur  in August 2025. He was seen in the ED on 01/11/23 with chest pain. EKG without ischemic changes. Troponin negative x 2. Imdur  added and he was discharged home. He was seen in our office in January 2025 and reported more frequent episodes of atrial fibrillation. His Flecainide  was increased but he did not tolerate the higher dose of Flecainide . Cardiac monitor February 2025 with 1% atrial fib burden. Echo February 2025 with LVEF=55-60%. No significant valve disease. Carotid artery dopplers in February 2025 with high grade RICA stenosis. He underwent right carotid endarterectomy in April 2025.   He is here today for follow up. The patient denies any chest pain, dyspnea, palpitations, lower extremity edema, orthopnea, PND, dizziness, near syncope or syncope.    Primary Care Physician: Olga Berthold, NP  Past Medical History:  Diagnosis Date   Allergy    Arthritis    psoriatic arthritis    Asthma    Atheroembolic kidney disease (HCC)    Atheroembolism    CAD (coronary artery disease)    hx non obst   Cancer (HCC)    basal cell x3 , last one removed - 12/12/2013, R temple area of forehead   Carotid artery occlusion    Chronic kidney disease    renal stones - tx. /w lithotripsy   Complication of anesthesia    GERD (gastroesophageal reflux  disease)    HLD (hyperlipidemia)    HTN (hypertension)    Hypoglycemia    Paroxysmal atrial fibrillation (HCC)    PONV (postoperative nausea and vomiting)    Pruritus    Psoriasis    Shortness of breath    after eating , if he gets active, he might have some SOB   Vitamin D deficiency     Past Surgical History:  Procedure Laterality Date   AV FISTULA PLACEMENT  07-2009   Left Brachiocephalic AVF   CARDIAC CATHETERIZATION  2010   COLONOSCOPY  09/15/2012   Colonic polyp status post polypectomy. Mild sigmoid diverticulosis. Small internal hemorrhoids. Otherwise  normal colonoscopy    ENDARTERECTOMY Right 08/20/2023   Procedure: ENDARTERECTOMY, CAROTID;  Surgeon: Kayla Part, MD;  Location: [facility] OR;  Service: Vascular;  Laterality: Right;   LIGATION OF ARTERIOVENOUS  FISTULA Left 12/30/2013   Procedure: EXCISION OF LEFT ARM ARTERIOVENOUS  FISTULA;  Surgeon: Mayo Speck, MD;  Location: [facility] OR;  Service: Vascular;  Laterality: Left;   LITHOTRIPSY     PATCH ANGIOPLASTY Right 08/20/2023   Procedure: PATCH ANGIOPLASTY USING Corinna Dickens BIOLOGIC PATCH;  Surgeon: Kayla Part, MD;  Location: [facility] OR;  Service: Vascular;  Laterality: Right;   skin cancer removed     multiple basal cell    Current Outpatient Medications  Medication Sig Dispense Refill   acetaminophen  (TYLENOL ) 500 MG tablet Take 500 mg by mouth every 6 (six) hours as needed for mild pain (pain score 1-3), moderate pain (pain score 4-6) or headache.     amLODipine  (NORVASC ) 5 MG tablet Take 1 tablet (5 mg total) by mouth daily for blood pressure 90 tablet 4   aspirin  EC 81 MG tablet Take 81 mg by mouth daily. Swallow whole.     betamethasone  dipropionate 0.05 % lotion Apply 1 application  topically 2 (two) times daily as needed (Psoriasis).     Cholecalciferol (VITAMIN D-3) 125 MCG (5000 UT) TABS Take 5,000 Units by mouth daily.     Coenzyme Q10 (COQ-10) 100 MG capsule Take 100 mg by mouth daily.     Evolocumab  (REPATHA  SURECLICK) 140 MG/ML SOAJ Inject 140 mg into the skin every 14 (fourteen) days. 6 mL 3   flecainide  (TAMBOCOR ) 50 MG tablet Take 1 (one)  Tablet by mouth two times daily 180 tablet 1   isosorbide  mononitrate (IMDUR ) 30 MG 24 hr tablet Take 1 tablet (30 mg total) by mouth daily. 90 tablet 3   labetalol  (NORMODYNE ) 100 MG tablet Take 200 mg by mouth 2 (two) times daily.     loratadine (CLARITIN) 10 MG tablet Take 10 mg by mouth daily.     Omega-3 Fatty Acids (FISH OIL) 1000 MG CAPS Take 1,000 mg by mouth daily.     sodium chloride  (OCEAN) 0.65 % SOLN nasal spray Place 1 spray into  both nostrils at bedtime as needed for congestion.     Triamcinolone  Acetonide (KENALOG  IJ) Inject 1 application as directed as directed. 3-4 months     triamcinolone  cream (KENALOG ) 0.1 % Apply topically 2 (two) times daily. (Patient taking differently: Apply 1 Application topically 2 (two) times daily as needed (Itching).) 454 g 1   amLODipine  (NORVASC ) 10 MG tablet Take 5 mg by mouth at bedtime.     flecainide  (TAMBOCOR ) 100 MG tablet Take 1 tablet (100 mg total) by mouth 2 (two) times daily. (Patient taking differently: Take 50 mg by mouth 2 (two) times daily.) 180 tablet 3  nitroGLYCERIN  (NITROSTAT ) 0.4 MG SL tablet Place 1 tablet (0.4 mg total) under the tongue every 5 (five) minutes as needed for chest pain. 25 tablet 3   No current facility-administered medications for this visit.    Allergies  Allergen Reactions   Hydrocodone Nausea Only   Oxycodone  Nausea And Vomiting   Eliquis  [Apixaban ]     Itching and rash   Hydrochlorothiazide     anxious   Hytrin [Terazosin Hcl]     Heart races  Pt's wife unfamiliar (01/11/23)   Pradaxa  [Dabigatran  Etexilate Mesylate]     Itching and rash    Sulfa Antibiotics Itching   Xarelto  [Rivaroxaban ]     Itching and rash     Social History   Socioeconomic History   Marital status: Married    Spouse name: Not on file   Number of children: 0   Years of education: Not on file   Highest education level: Not on file  Occupational History   Occupation: Retired from a Education officer, environmental  Tobacco Use   Smoking status: Former    Current packs/day: 0.00    Types: Cigarettes    Quit date: 05/21/1970    Years since quitting: 53.3   Smokeless tobacco: Never   Tobacco comments:    quit 40+ years ago   Vaping Use   Vaping status: Never Used  Substance and Sexual Activity   Alcohol use: No   Drug use: No   Sexual activity: Not on file  Other Topics Concern   Not on file  Social History Narrative   Married, lives with wife; Designer, television/film set.    Lives in [facility]   Social Drivers of Health   Financial Resource Strain: Not on file  Food Insecurity: No Food Insecurity (08/20/2023)   Hunger Vital Sign    Worried About Running Out of Food in the Last Year: Never true    Ran Out of Food in the Last Year: Never true  Transportation Needs: No Transportation Needs (08/20/2023)   PRAPARE - Administrator, Civil Service (Medical): No    Lack of Transportation (Non-Medical): No  Physical Activity: Not on file  Stress: Not on file  Social Connections: Socially Integrated (08/20/2023)   Social Connection and Isolation Panel [NHANES]    Frequency of Communication with Friends and Family: More than three times a week    Frequency of Social Gatherings with Friends and Family: More than three times a week    Attends Religious Services: More than 4 times per year    Active Member of [employer] or Organizations: Yes    Attends Banker Meetings: 1 to 4 times per year    Marital Status: Married  Catering manager Violence: Not At Risk (08/20/2023)   Humiliation, Afraid, Rape, and Kick questionnaire    Fear of Current or Ex-Partner: No    Emotionally Abused: No    Physically Abused: No    Sexually Abused: No    Family History  Problem Relation Age of Onset   Cancer Mother    Heart disease Father        Before age 36   Asthma Father    Heart disease Brother        After 57 years of age   Coronary artery disease Other        family hx   Colon cancer Neg Hx    Esophageal cancer Neg Hx    Liver cancer Neg Hx  Pancreatic cancer Neg Hx    Rectal cancer Neg Hx    Stomach cancer Neg Hx     Review of Systems:  As stated in the HPI and otherwise negative.   BP 112/72   Pulse (!) 53   Ht 5\' 10"  (1.778 m)   Wt 73.3 kg   SpO2 99%   BMI 23.19 kg/m   Physical Examination: General: Well developed, well nourished, NAD  HEENT: OP clear, mucus membranes moist  SKIN: warm, dry. No rashes. Neuro: No focal deficits   Musculoskeletal: Muscle strength 5/5 all ext  Psychiatric: Mood and affect normal  Neck: No JVD, no carotid bruits, no thyromegaly, no lymphadenopathy.  Lungs:Clear bilaterally, no wheezes, rhonci, crackles Cardiovascular: Regular rate and rhythm. No murmurs, gallops or rubs. Abdomen:Soft. Bowel sounds present. Non-tender.  Extremities: No lower extremity edema. Pulses are 2 + in the bilateral DP/PT.  EKG:  EKG is not ordered today. The ekg ordered today demonstrates   Lipid Panel: Lipids followed in primary care  Recent Labs: 08/20/2023: ALT 20 08/21/2023: BUN 32; Creatinine, Ser 1.99; Hemoglobin 12.6; Platelets 212; Potassium 4.0; Sodium 138   Lipid Panel  Wt Readings from Last 3 Encounters:  09/09/23 73.3 kg  09/05/23 66.7 kg  08/20/23 65.8 kg    Assessment and Plan:   1. Paroxysmal atrial fibrillation: Sinus today on exam. He does not tolerate Xarelto , Eliquis  or Pradaxa  due to a rash. We have discussed coumadin again today. He does not want to start anti-coagulation. I have reviewed his CHADS VASC score of 3 which he understands indicated a 3.2 % risk per year of a stroke. Continue ASA and Flecainide   2. CAD with stable angina: No exertional chest pain. Chronic angina stable on Imdur . He is not an ideal candidate for cath or coronary CTA given his renal function and his fear of procedures after distal embolization occurred with cath in 2010.Continue beta blocker, Repatha  and Imdur . SL NTG as needed.     3. Carotid artery disease:  Right carotid endarterectomy April 2025 per Dr. Rosalva Comber.     4. HTN: BP is well controlled. Continue current therapy.   5. Hyperlipidemia: He has some muscle weakness in his legs. Will stop Pravastatin  and continue Repatha .   Labs/ tests ordered today include:   No orders of the defined types were placed in this encounter.  Disposition:   F/U with me in 12 months.   Signed, Antoinette Batman, MD 09/09/2023 11:29 AM    [facility] [address] [place], [place], [place]  [address] Phone: [phone]; Fax: [phone]

## 2023-09-11 DIAGNOSIS — E538 Deficiency of other specified B group vitamins: Secondary | ICD-10-CM | POA: Diagnosis not present

## 2023-09-19 ENCOUNTER — Other Ambulatory Visit: Payer: Self-pay | Admitting: *Deleted

## 2023-09-19 DIAGNOSIS — I6521 Occlusion and stenosis of right carotid artery: Secondary | ICD-10-CM

## 2023-09-23 DIAGNOSIS — J309 Allergic rhinitis, unspecified: Secondary | ICD-10-CM | POA: Diagnosis not present

## 2023-09-23 DIAGNOSIS — I2510 Atherosclerotic heart disease of native coronary artery without angina pectoris: Secondary | ICD-10-CM | POA: Diagnosis not present

## 2023-09-23 DIAGNOSIS — E785 Hyperlipidemia, unspecified: Secondary | ICD-10-CM | POA: Diagnosis not present

## 2023-09-23 DIAGNOSIS — H539 Unspecified visual disturbance: Secondary | ICD-10-CM | POA: Diagnosis not present

## 2023-09-23 DIAGNOSIS — R7309 Other abnormal glucose: Secondary | ICD-10-CM | POA: Diagnosis not present

## 2023-09-23 DIAGNOSIS — E559 Vitamin D deficiency, unspecified: Secondary | ICD-10-CM | POA: Diagnosis not present

## 2023-09-23 DIAGNOSIS — I480 Paroxysmal atrial fibrillation: Secondary | ICD-10-CM | POA: Diagnosis not present

## 2023-09-23 DIAGNOSIS — R972 Elevated prostate specific antigen [PSA]: Secondary | ICD-10-CM | POA: Diagnosis not present

## 2023-09-23 DIAGNOSIS — D539 Nutritional anemia, unspecified: Secondary | ICD-10-CM | POA: Diagnosis not present

## 2023-09-23 DIAGNOSIS — I10 Essential (primary) hypertension: Secondary | ICD-10-CM | POA: Diagnosis not present

## 2023-09-23 DIAGNOSIS — N184 Chronic kidney disease, stage 4 (severe): Secondary | ICD-10-CM | POA: Diagnosis not present

## 2023-09-23 DIAGNOSIS — I6523 Occlusion and stenosis of bilateral carotid arteries: Secondary | ICD-10-CM | POA: Diagnosis not present

## 2023-09-25 DIAGNOSIS — E538 Deficiency of other specified B group vitamins: Secondary | ICD-10-CM | POA: Diagnosis not present

## 2023-09-26 DIAGNOSIS — N2581 Secondary hyperparathyroidism of renal origin: Secondary | ICD-10-CM | POA: Diagnosis not present

## 2023-09-26 DIAGNOSIS — N1832 Chronic kidney disease, stage 3b: Secondary | ICD-10-CM | POA: Diagnosis not present

## 2023-09-26 DIAGNOSIS — I129 Hypertensive chronic kidney disease with stage 1 through stage 4 chronic kidney disease, or unspecified chronic kidney disease: Secondary | ICD-10-CM | POA: Diagnosis not present

## 2023-10-03 DIAGNOSIS — R7309 Other abnormal glucose: Secondary | ICD-10-CM | POA: Diagnosis not present

## 2023-10-11 ENCOUNTER — Other Ambulatory Visit (HOSPITAL_BASED_OUTPATIENT_CLINIC_OR_DEPARTMENT_OTHER): Payer: Self-pay

## 2023-10-15 ENCOUNTER — Other Ambulatory Visit (HOSPITAL_BASED_OUTPATIENT_CLINIC_OR_DEPARTMENT_OTHER): Payer: Self-pay

## 2023-10-15 DIAGNOSIS — R6889 Other general symptoms and signs: Secondary | ICD-10-CM | POA: Diagnosis not present

## 2023-10-15 DIAGNOSIS — R29898 Other symptoms and signs involving the musculoskeletal system: Secondary | ICD-10-CM | POA: Diagnosis not present

## 2023-10-17 DIAGNOSIS — R29898 Other symptoms and signs involving the musculoskeletal system: Secondary | ICD-10-CM | POA: Diagnosis not present

## 2023-10-17 DIAGNOSIS — R6889 Other general symptoms and signs: Secondary | ICD-10-CM | POA: Diagnosis not present

## 2023-10-18 ENCOUNTER — Telehealth: Payer: Self-pay

## 2023-10-18 NOTE — Telephone Encounter (Signed)
 Pt's wife called to make sure it was fine for pt to start PT to help with his leg strength. He had surgery on 4/1. Advised her this is appropriate and to ease in to it. She verbalized understanding. No further questions/concerns at this time.

## 2023-10-23 DIAGNOSIS — R6889 Other general symptoms and signs: Secondary | ICD-10-CM | POA: Diagnosis not present

## 2023-10-23 DIAGNOSIS — R29898 Other symptoms and signs involving the musculoskeletal system: Secondary | ICD-10-CM | POA: Diagnosis not present

## 2023-10-28 DIAGNOSIS — R29898 Other symptoms and signs involving the musculoskeletal system: Secondary | ICD-10-CM | POA: Diagnosis not present

## 2023-10-28 DIAGNOSIS — R6889 Other general symptoms and signs: Secondary | ICD-10-CM | POA: Diagnosis not present

## 2023-11-04 DIAGNOSIS — R6889 Other general symptoms and signs: Secondary | ICD-10-CM | POA: Diagnosis not present

## 2023-11-04 DIAGNOSIS — R29898 Other symptoms and signs involving the musculoskeletal system: Secondary | ICD-10-CM | POA: Diagnosis not present

## 2023-11-06 DIAGNOSIS — R29898 Other symptoms and signs involving the musculoskeletal system: Secondary | ICD-10-CM | POA: Diagnosis not present

## 2023-11-06 DIAGNOSIS — R6889 Other general symptoms and signs: Secondary | ICD-10-CM | POA: Diagnosis not present

## 2023-11-07 DIAGNOSIS — R29898 Other symptoms and signs involving the musculoskeletal system: Secondary | ICD-10-CM | POA: Diagnosis not present

## 2023-11-07 DIAGNOSIS — I10 Essential (primary) hypertension: Secondary | ICD-10-CM | POA: Diagnosis not present

## 2023-11-07 DIAGNOSIS — I480 Paroxysmal atrial fibrillation: Secondary | ICD-10-CM | POA: Diagnosis not present

## 2023-11-07 DIAGNOSIS — R531 Weakness: Secondary | ICD-10-CM | POA: Diagnosis not present

## 2023-11-08 ENCOUNTER — Emergency Department (HOSPITAL_COMMUNITY)

## 2023-11-08 ENCOUNTER — Other Ambulatory Visit: Payer: Self-pay

## 2023-11-08 ENCOUNTER — Encounter (HOSPITAL_COMMUNITY): Payer: Self-pay | Admitting: Emergency Medicine

## 2023-11-08 ENCOUNTER — Telehealth: Payer: Self-pay | Admitting: Pharmacist

## 2023-11-08 ENCOUNTER — Inpatient Hospital Stay (HOSPITAL_COMMUNITY)
Admission: EM | Admit: 2023-11-08 | Discharge: 2023-11-20 | DRG: 219 | Disposition: A | Discharge status: Home Health | Attending: Internal Medicine | Admitting: Internal Medicine

## 2023-11-08 DIAGNOSIS — N400 Enlarged prostate without lower urinary tract symptoms: Secondary | ICD-10-CM | POA: Diagnosis not present

## 2023-11-08 DIAGNOSIS — J439 Emphysema, unspecified: Secondary | ICD-10-CM | POA: Diagnosis not present

## 2023-11-08 DIAGNOSIS — I480 Paroxysmal atrial fibrillation: Secondary | ICD-10-CM | POA: Diagnosis present

## 2023-11-08 DIAGNOSIS — Z95828 Presence of other vascular implants and grafts: Secondary | ICD-10-CM | POA: Diagnosis not present

## 2023-11-08 DIAGNOSIS — Z825 Family history of asthma and other chronic lower respiratory diseases: Secondary | ICD-10-CM

## 2023-11-08 DIAGNOSIS — L4050 Arthropathic psoriasis, unspecified: Secondary | ICD-10-CM | POA: Diagnosis present

## 2023-11-08 DIAGNOSIS — I4891 Unspecified atrial fibrillation: Secondary | ICD-10-CM

## 2023-11-08 DIAGNOSIS — N131 Hydronephrosis with ureteral stricture, not elsewhere classified: Secondary | ICD-10-CM | POA: Diagnosis present

## 2023-11-08 DIAGNOSIS — I161 Hypertensive emergency: Secondary | ICD-10-CM | POA: Diagnosis not present

## 2023-11-08 DIAGNOSIS — K219 Gastro-esophageal reflux disease without esophagitis: Secondary | ICD-10-CM | POA: Diagnosis present

## 2023-11-08 DIAGNOSIS — I7409 Other arterial embolism and thrombosis of abdominal aorta: Secondary | ICD-10-CM | POA: Diagnosis not present

## 2023-11-08 DIAGNOSIS — Z87891 Personal history of nicotine dependence: Secondary | ICD-10-CM

## 2023-11-08 DIAGNOSIS — I719 Aortic aneurysm of unspecified site, without rupture: Secondary | ICD-10-CM | POA: Diagnosis not present

## 2023-11-08 DIAGNOSIS — R251 Tremor, unspecified: Secondary | ICD-10-CM | POA: Diagnosis present

## 2023-11-08 DIAGNOSIS — I71012 Dissection of descending thoracic aorta: Principal | ICD-10-CM | POA: Diagnosis present

## 2023-11-08 DIAGNOSIS — E872 Acidosis, unspecified: Secondary | ICD-10-CM | POA: Diagnosis not present

## 2023-11-08 DIAGNOSIS — Z87442 Personal history of urinary calculi: Secondary | ICD-10-CM

## 2023-11-08 DIAGNOSIS — K567 Ileus, unspecified: Secondary | ICD-10-CM | POA: Diagnosis not present

## 2023-11-08 DIAGNOSIS — Z8249 Family history of ischemic heart disease and other diseases of the circulatory system: Secondary | ICD-10-CM

## 2023-11-08 DIAGNOSIS — K429 Umbilical hernia without obstruction or gangrene: Secondary | ICD-10-CM | POA: Diagnosis not present

## 2023-11-08 DIAGNOSIS — L409 Psoriasis, unspecified: Secondary | ICD-10-CM | POA: Diagnosis present

## 2023-11-08 DIAGNOSIS — R112 Nausea with vomiting, unspecified: Secondary | ICD-10-CM | POA: Diagnosis not present

## 2023-11-08 DIAGNOSIS — N1330 Unspecified hydronephrosis: Secondary | ICD-10-CM | POA: Diagnosis not present

## 2023-11-08 DIAGNOSIS — E871 Hypo-osmolality and hyponatremia: Secondary | ICD-10-CM | POA: Diagnosis not present

## 2023-11-08 DIAGNOSIS — Z4682 Encounter for fitting and adjustment of non-vascular catheter: Secondary | ICD-10-CM | POA: Diagnosis not present

## 2023-11-08 DIAGNOSIS — Z8673 Personal history of transient ischemic attack (TIA), and cerebral infarction without residual deficits: Secondary | ICD-10-CM

## 2023-11-08 DIAGNOSIS — I10 Essential (primary) hypertension: Secondary | ICD-10-CM | POA: Diagnosis not present

## 2023-11-08 DIAGNOSIS — N281 Cyst of kidney, acquired: Secondary | ICD-10-CM | POA: Diagnosis not present

## 2023-11-08 DIAGNOSIS — M549 Dorsalgia, unspecified: Secondary | ICD-10-CM | POA: Diagnosis not present

## 2023-11-08 DIAGNOSIS — I7100 Dissection of unspecified site of aorta: Secondary | ICD-10-CM | POA: Diagnosis present

## 2023-11-08 DIAGNOSIS — I77810 Thoracic aortic ectasia: Secondary | ICD-10-CM | POA: Diagnosis not present

## 2023-11-08 DIAGNOSIS — M545 Low back pain, unspecified: Secondary | ICD-10-CM | POA: Diagnosis not present

## 2023-11-08 DIAGNOSIS — N179 Acute kidney failure, unspecified: Secondary | ICD-10-CM | POA: Diagnosis present

## 2023-11-08 DIAGNOSIS — R34 Anuria and oliguria: Secondary | ICD-10-CM | POA: Diagnosis not present

## 2023-11-08 DIAGNOSIS — Q254 Congenital malformation of aorta unspecified: Principal | ICD-10-CM

## 2023-11-08 DIAGNOSIS — Z85828 Personal history of other malignant neoplasm of skin: Secondary | ICD-10-CM

## 2023-11-08 DIAGNOSIS — M1990 Unspecified osteoarthritis, unspecified site: Secondary | ICD-10-CM | POA: Diagnosis present

## 2023-11-08 DIAGNOSIS — R339 Retention of urine, unspecified: Secondary | ICD-10-CM | POA: Diagnosis not present

## 2023-11-08 DIAGNOSIS — J9690 Respiratory failure, unspecified, unspecified whether with hypoxia or hypercapnia: Secondary | ICD-10-CM | POA: Diagnosis not present

## 2023-11-08 DIAGNOSIS — Z79899 Other long term (current) drug therapy: Secondary | ICD-10-CM

## 2023-11-08 DIAGNOSIS — E785 Hyperlipidemia, unspecified: Secondary | ICD-10-CM | POA: Diagnosis not present

## 2023-11-08 DIAGNOSIS — I71011 Dissection of aortic arch: Secondary | ICD-10-CM | POA: Diagnosis not present

## 2023-11-08 DIAGNOSIS — I700 Atherosclerosis of aorta: Secondary | ICD-10-CM | POA: Diagnosis present

## 2023-11-08 DIAGNOSIS — M546 Pain in thoracic spine: Secondary | ICD-10-CM | POA: Diagnosis not present

## 2023-11-08 DIAGNOSIS — N1832 Chronic kidney disease, stage 3b: Secondary | ICD-10-CM | POA: Diagnosis present

## 2023-11-08 DIAGNOSIS — K56600 Partial intestinal obstruction, unspecified as to cause: Secondary | ICD-10-CM | POA: Diagnosis not present

## 2023-11-08 DIAGNOSIS — K9189 Other postprocedural complications and disorders of digestive system: Secondary | ICD-10-CM | POA: Diagnosis not present

## 2023-11-08 DIAGNOSIS — J9811 Atelectasis: Secondary | ICD-10-CM | POA: Diagnosis not present

## 2023-11-08 DIAGNOSIS — I7123 Aneurysm of the descending thoracic aorta, without rupture: Secondary | ICD-10-CM | POA: Diagnosis present

## 2023-11-08 DIAGNOSIS — I5031 Acute diastolic (congestive) heart failure: Secondary | ICD-10-CM | POA: Diagnosis not present

## 2023-11-08 DIAGNOSIS — I517 Cardiomegaly: Secondary | ICD-10-CM | POA: Diagnosis not present

## 2023-11-08 DIAGNOSIS — Z860100 Personal history of colon polyps, unspecified: Secondary | ICD-10-CM

## 2023-11-08 DIAGNOSIS — Z882 Allergy status to sulfonamides status: Secondary | ICD-10-CM

## 2023-11-08 DIAGNOSIS — R140 Abdominal distension (gaseous): Secondary | ICD-10-CM | POA: Diagnosis not present

## 2023-11-08 DIAGNOSIS — E876 Hypokalemia: Secondary | ICD-10-CM | POA: Diagnosis not present

## 2023-11-08 DIAGNOSIS — E43 Unspecified severe protein-calorie malnutrition: Secondary | ICD-10-CM | POA: Diagnosis present

## 2023-11-08 DIAGNOSIS — Z7982 Long term (current) use of aspirin: Secondary | ICD-10-CM

## 2023-11-08 DIAGNOSIS — N270 Small kidney, unilateral: Secondary | ICD-10-CM | POA: Diagnosis not present

## 2023-11-08 DIAGNOSIS — Z888 Allergy status to other drugs, medicaments and biological substances status: Secondary | ICD-10-CM

## 2023-11-08 DIAGNOSIS — I4819 Other persistent atrial fibrillation: Secondary | ICD-10-CM | POA: Diagnosis present

## 2023-11-08 DIAGNOSIS — R109 Unspecified abdominal pain: Secondary | ICD-10-CM | POA: Diagnosis not present

## 2023-11-08 DIAGNOSIS — N189 Chronic kidney disease, unspecified: Secondary | ICD-10-CM | POA: Diagnosis not present

## 2023-11-08 DIAGNOSIS — E875 Hyperkalemia: Secondary | ICD-10-CM | POA: Diagnosis not present

## 2023-11-08 DIAGNOSIS — I2510 Atherosclerotic heart disease of native coronary artery without angina pectoris: Secondary | ICD-10-CM | POA: Diagnosis present

## 2023-11-08 DIAGNOSIS — J90 Pleural effusion, not elsewhere classified: Secondary | ICD-10-CM | POA: Diagnosis not present

## 2023-11-08 DIAGNOSIS — R110 Nausea: Secondary | ICD-10-CM | POA: Diagnosis not present

## 2023-11-08 DIAGNOSIS — Z681 Body mass index (BMI) 19 or less, adult: Secondary | ICD-10-CM

## 2023-11-08 DIAGNOSIS — R079 Chest pain, unspecified: Secondary | ICD-10-CM | POA: Diagnosis not present

## 2023-11-08 DIAGNOSIS — I129 Hypertensive chronic kidney disease with stage 1 through stage 4 chronic kidney disease, or unspecified chronic kidney disease: Secondary | ICD-10-CM | POA: Diagnosis present

## 2023-11-08 DIAGNOSIS — Z885 Allergy status to narcotic agent status: Secondary | ICD-10-CM

## 2023-11-08 DIAGNOSIS — N289 Disorder of kidney and ureter, unspecified: Secondary | ICD-10-CM | POA: Diagnosis not present

## 2023-11-08 DIAGNOSIS — Z809 Family history of malignant neoplasm, unspecified: Secondary | ICD-10-CM

## 2023-11-08 DIAGNOSIS — Z452 Encounter for adjustment and management of vascular access device: Secondary | ICD-10-CM | POA: Diagnosis not present

## 2023-11-08 LAB — CBC WITH DIFFERENTIAL/PLATELET
Abs Immature Granulocytes: 0.05 10*3/uL (ref 0.00–0.07)
Basophils Absolute: 0.1 10*3/uL (ref 0.0–0.1)
Basophils Relative: 1 %
Eosinophils Absolute: 0.3 10*3/uL (ref 0.0–0.5)
Eosinophils Relative: 4 %
HCT: 47.3 % (ref 39.0–52.0)
Hemoglobin: 16.2 g/dL (ref 13.0–17.0)
Immature Granulocytes: 1 %
Lymphocytes Relative: 27 %
Lymphs Abs: 2.3 10*3/uL (ref 0.7–4.0)
MCH: 31 pg (ref 26.0–34.0)
MCHC: 34.2 g/dL (ref 30.0–36.0)
MCV: 90.4 fL (ref 80.0–100.0)
Monocytes Absolute: 1.2 10*3/uL — ABNORMAL HIGH (ref 0.1–1.0)
Monocytes Relative: 14 %
Neutro Abs: 4.5 10*3/uL (ref 1.7–7.7)
Neutrophils Relative %: 53 %
Platelets: 251 10*3/uL (ref 150–400)
RBC: 5.23 MIL/uL (ref 4.22–5.81)
RDW: 13.5 % (ref 11.5–15.5)
WBC: 8.5 10*3/uL (ref 4.0–10.5)
nRBC: 0 % (ref 0.0–0.2)

## 2023-11-08 LAB — COMPREHENSIVE METABOLIC PANEL WITH GFR
ALT: 20 U/L (ref 0–44)
AST: 25 U/L (ref 15–41)
Albumin: 3.8 g/dL (ref 3.5–5.0)
Alkaline Phosphatase: 66 U/L (ref 38–126)
Anion gap: 12 (ref 5–15)
BUN: 33 mg/dL — ABNORMAL HIGH (ref 8–23)
CO2: 25 mmol/L (ref 22–32)
Calcium: 9.7 mg/dL (ref 8.9–10.3)
Chloride: 103 mmol/L (ref 98–111)
Creatinine, Ser: 1.9 mg/dL — ABNORMAL HIGH (ref 0.61–1.24)
GFR, Estimated: 36 mL/min — ABNORMAL LOW (ref >60)
Glucose, Bld: 118 mg/dL — ABNORMAL HIGH (ref 70–99)
Potassium: 3.8 mmol/L (ref 3.5–5.1)
Sodium: 140 mmol/L (ref 135–145)
Total Bilirubin: 0.3 mg/dL (ref 0.0–1.2)
Total Protein: 6.7 g/dL (ref 6.5–8.1)

## 2023-11-08 LAB — TROPONIN I (HIGH SENSITIVITY): Troponin I (High Sensitivity): 6 ng/L (ref <18)

## 2023-11-08 LAB — LIPASE, BLOOD: Lipase: 38 U/L (ref 11–51)

## 2023-11-08 MED ORDER — ACETAMINOPHEN 325 MG PO TABS
650.0000 mg | ORAL_TABLET | Freq: Once | ORAL | Status: AC
  Administered 2023-11-08: 650 mg via ORAL
  Filled 2023-11-08: qty 2

## 2023-11-08 NOTE — Telephone Encounter (Signed)
 I saw pt wife today in the office. He asked about the [employer] grant. Team- can you please get pt healthwell grant and add to [facility]. He uses [facility] [employer].

## 2023-11-08 NOTE — ED Provider Notes (Signed)
 [place] EMERGENCY DEPARTMENT AT [facility] Provider Note   CSN: [id number] Arrival date & time: 11/08/23  2045     Patient presents with: Back Pain   Jack [name] is a 78 y.o. male.   HPI 78 year old male presents with sudden onset of midthoracic back pain.  Pain started at rest at around 6:40 PM while he was checking his blood pressure.  No recent trauma or any cause that would cause an obvious injury.  No chest pain, shortness of breath or radiation of the pain.  No abdominal pain.  No weakness or numbness in his extremities or incontinence.  Patient's pain was very severe and he tried a nitroglycerin  and eventually his pain has gotten better and is now 4 out of 10.  Rubbing the area makes it improved but nothing else particular makes it better or worse.  Prior to Admission medications   Medication Sig Start Date End Date Taking? Authorizing Provider  acetaminophen  (TYLENOL ) 500 MG tablet Take 500 mg by mouth every 6 (six) hours as needed for mild pain (pain score 1-3), moderate pain (pain score 4-6) or headache.    [provider]  amLODipine  (NORVASC ) 10 MG tablet Take 5 mg by mouth at bedtime.    [provider]  amLODipine  (NORVASC ) 5 MG tablet Take 1 tablet (5 mg total) by mouth daily for blood pressure 08/23/23     aspirin  EC 81 MG tablet Take 81 mg by mouth daily. Swallow whole.    [provider]  betamethasone  dipropionate 0.05 % lotion Apply 1 application  topically 2 (two) times daily as needed (Psoriasis).    [provider]  Cholecalciferol (VITAMIN D-3) 125 MCG (5000 UT) TABS Take 5,000 Units by mouth daily.    [provider]  Coenzyme Q10 (COQ-10) 100 MG capsule Take 100 mg by mouth daily.    [provider]  Evolocumab  (REPATHA  SURECLICK) 140 MG/ML SOAJ Inject 140 mg into the skin every 14 (fourteen) days. 06/21/23     flecainide  (TAMBOCOR ) 100 MG tablet Take 1 tablet (100 mg total) by mouth 2 (two) times  daily. Patient taking differently: Take 50 mg by mouth 2 (two) times daily. 05/29/23   Gerald Kitty., NP  flecainide  (TAMBOCOR ) 50 MG tablet Take 1 (one)  Tablet by mouth two times daily 08/23/23     isosorbide  mononitrate (IMDUR ) 30 MG 24 hr tablet Take 1 tablet (30 mg total) by mouth daily. 01/17/23   Odie Benne, MD  labetalol  (NORMODYNE ) 100 MG tablet Take 200 mg by mouth 2 (two) times daily.    [provider]  loratadine (CLARITIN) 10 MG tablet Take 10 mg by mouth daily.    [provider]  nitroGLYCERIN  (NITROSTAT ) 0.4 MG SL tablet Place 1 tablet (0.4 mg total) under the tongue every 5 (five) minutes as needed for chest pain. 01/17/23 08/16/23  Odie Benne, MD  Omega-3 Fatty Acids (FISH OIL) 1000 MG CAPS Take 1,000 mg by mouth daily.    [provider]  sodium chloride  (OCEAN) 0.65 % SOLN nasal spray Place 1 spray into both nostrils at bedtime as needed for congestion.    [provider]  Triamcinolone  Acetonide (KENALOG  IJ) Inject 1 application as directed as directed. 3-4 months    [provider]  triamcinolone  cream (KENALOG ) 0.1 % Apply topically 2 (two) times daily. Patient taking differently: Apply 1 Application topically 2 (two) times daily as needed (Itching). 10/22/22  Allergies: Hydrocodone, Oxycodone , Eliquis  [apixaban ], Hydrochlorothiazide, Hytrin [terazosin hcl], Pradaxa  [dabigatran  etexilate mesylate], Sulfa antibiotics, and Xarelto  [rivaroxaban ]    Review of Systems  Respiratory:  Negative for shortness of breath.   Cardiovascular:  Negative for chest pain.  Gastrointestinal:  Negative for abdominal pain.  Musculoskeletal:  Positive for back pain.  Neurological:  Negative for weakness and numbness.    Updated Vital Signs BP (!) 180/120   Pulse 81   Temp 97.8 F (36.6 C) (Oral)   Resp 18   Ht 5' 10 (1.778 m)   Wt 66.7 kg   SpO2 100%   BMI 21.09 kg/m   Physical Exam Vitals and nursing note  reviewed.  Constitutional:      General: He is not in acute distress.    Appearance: He is well-developed. He is not ill-appearing or diaphoretic.  HENT:     Head: Normocephalic and atraumatic.   Cardiovascular:     Rate and Rhythm: Normal rate and regular rhythm.     Heart sounds: Normal heart sounds.  Pulmonary:     Effort: Pulmonary effort is normal.     Breath sounds: Normal breath sounds.  Abdominal:     Palpations: Abdomen is soft.     Tenderness: There is no abdominal tenderness.   Musculoskeletal:     Cervical back: No tenderness.     Thoracic back: No tenderness.     Lumbar back: No tenderness.       Back:   Skin:    General: Skin is warm and dry.   Neurological:     Mental Status: He is alert.     Comments: 5/5 strength in BLE.     (all labs ordered are listed, but only abnormal results are displayed) Labs Reviewed  COMPREHENSIVE METABOLIC PANEL WITH GFR  LIPASE, BLOOD  CBC WITH DIFFERENTIAL/PLATELET  I-STAT CHEM 8, ED  TROPONIN I (HIGH SENSITIVITY)    EKG: None  Radiology: No results found.   Procedures   Medications Ordered in the ED  acetaminophen  (TYLENOL ) tablet 650 mg (has no administration in time range)                                    Medical Decision Making Amount and/or Complexity of Data Reviewed Labs: ordered.    Details: CKD, stable Radiology: ordered and independent interpretation performed.    Details: No CHF ECG/medicine tests: ordered and independent interpretation performed.    Details: Unchanged from baseline  Risk OTC drugs.   Patient is in no distress. Initial EKG/troponin show no STEMI or MI. However with his acute non-traumatic pain and HTN he will need an evaluation for aortic dissection. He does not want a CTA due to his CKD (and one kidney). Will do MRI. He currently remains stable. Care transferred to Dr. Lula Sale.     Final diagnoses:  None    ED Discharge Orders     None          Jerilynn Montenegro, MD 11/08/23 2315

## 2023-11-08 NOTE — ED Triage Notes (Signed)
 BIB EMS From home sudden onset upper back pain worse with movement. No injury. Started while sitting down watching TV.  EMS VS 180/110 86 hr 95% ra

## 2023-11-09 ENCOUNTER — Emergency Department (HOSPITAL_COMMUNITY)

## 2023-11-09 DIAGNOSIS — I480 Paroxysmal atrial fibrillation: Secondary | ICD-10-CM | POA: Diagnosis not present

## 2023-11-09 DIAGNOSIS — Z87891 Personal history of nicotine dependence: Secondary | ICD-10-CM | POA: Diagnosis not present

## 2023-11-09 DIAGNOSIS — I7113 Aneurysm of the descending thoracic aorta, ruptured: Secondary | ICD-10-CM | POA: Diagnosis not present

## 2023-11-09 DIAGNOSIS — K219 Gastro-esophageal reflux disease without esophagitis: Secondary | ICD-10-CM | POA: Diagnosis not present

## 2023-11-09 DIAGNOSIS — E785 Hyperlipidemia, unspecified: Secondary | ICD-10-CM

## 2023-11-09 DIAGNOSIS — E875 Hyperkalemia: Secondary | ICD-10-CM | POA: Diagnosis not present

## 2023-11-09 DIAGNOSIS — I77810 Thoracic aortic ectasia: Secondary | ICD-10-CM | POA: Diagnosis not present

## 2023-11-09 DIAGNOSIS — L4050 Arthropathic psoriasis, unspecified: Secondary | ICD-10-CM | POA: Diagnosis not present

## 2023-11-09 DIAGNOSIS — Z9889 Other specified postprocedural states: Secondary | ICD-10-CM | POA: Diagnosis not present

## 2023-11-09 DIAGNOSIS — R110 Nausea: Secondary | ICD-10-CM | POA: Diagnosis present

## 2023-11-09 DIAGNOSIS — E871 Hypo-osmolality and hyponatremia: Secondary | ICD-10-CM | POA: Diagnosis not present

## 2023-11-09 DIAGNOSIS — I10 Essential (primary) hypertension: Secondary | ICD-10-CM | POA: Diagnosis not present

## 2023-11-09 DIAGNOSIS — I161 Hypertensive emergency: Secondary | ICD-10-CM | POA: Diagnosis not present

## 2023-11-09 DIAGNOSIS — I7100 Dissection of unspecified site of aorta: Secondary | ICD-10-CM | POA: Diagnosis not present

## 2023-11-09 DIAGNOSIS — N400 Enlarged prostate without lower urinary tract symptoms: Secondary | ICD-10-CM | POA: Diagnosis not present

## 2023-11-09 DIAGNOSIS — I719 Aortic aneurysm of unspecified site, without rupture: Secondary | ICD-10-CM | POA: Diagnosis not present

## 2023-11-09 DIAGNOSIS — N179 Acute kidney failure, unspecified: Secondary | ICD-10-CM | POA: Diagnosis not present

## 2023-11-09 DIAGNOSIS — R079 Chest pain, unspecified: Secondary | ICD-10-CM | POA: Diagnosis not present

## 2023-11-09 DIAGNOSIS — M549 Dorsalgia, unspecified: Secondary | ICD-10-CM | POA: Diagnosis not present

## 2023-11-09 DIAGNOSIS — I71011 Dissection of aortic arch: Secondary | ICD-10-CM | POA: Diagnosis not present

## 2023-11-09 DIAGNOSIS — N131 Hydronephrosis with ureteral stricture, not elsewhere classified: Secondary | ICD-10-CM | POA: Diagnosis not present

## 2023-11-09 DIAGNOSIS — N281 Cyst of kidney, acquired: Secondary | ICD-10-CM | POA: Diagnosis not present

## 2023-11-09 DIAGNOSIS — N1832 Chronic kidney disease, stage 3b: Secondary | ICD-10-CM | POA: Diagnosis not present

## 2023-11-09 DIAGNOSIS — I2510 Atherosclerotic heart disease of native coronary artery without angina pectoris: Secondary | ICD-10-CM | POA: Diagnosis not present

## 2023-11-09 DIAGNOSIS — Z79899 Other long term (current) drug therapy: Secondary | ICD-10-CM | POA: Diagnosis not present

## 2023-11-09 DIAGNOSIS — I71019 Dissection of thoracic aorta, unspecified: Secondary | ICD-10-CM | POA: Diagnosis not present

## 2023-11-09 DIAGNOSIS — Z681 Body mass index (BMI) 19 or less, adult: Secondary | ICD-10-CM | POA: Diagnosis not present

## 2023-11-09 DIAGNOSIS — I7409 Other arterial embolism and thrombosis of abdominal aorta: Secondary | ICD-10-CM | POA: Diagnosis not present

## 2023-11-09 DIAGNOSIS — I700 Atherosclerosis of aorta: Secondary | ICD-10-CM | POA: Diagnosis not present

## 2023-11-09 DIAGNOSIS — K9189 Other postprocedural complications and disorders of digestive system: Secondary | ICD-10-CM | POA: Diagnosis not present

## 2023-11-09 DIAGNOSIS — N289 Disorder of kidney and ureter, unspecified: Secondary | ICD-10-CM | POA: Diagnosis not present

## 2023-11-09 DIAGNOSIS — K567 Ileus, unspecified: Secondary | ICD-10-CM | POA: Diagnosis not present

## 2023-11-09 DIAGNOSIS — I71012 Dissection of descending thoracic aorta: Secondary | ICD-10-CM | POA: Diagnosis not present

## 2023-11-09 DIAGNOSIS — M546 Pain in thoracic spine: Secondary | ICD-10-CM | POA: Diagnosis not present

## 2023-11-09 DIAGNOSIS — I728 Aneurysm of other specified arteries: Secondary | ICD-10-CM | POA: Diagnosis not present

## 2023-11-09 DIAGNOSIS — I7123 Aneurysm of the descending thoracic aorta, without rupture: Secondary | ICD-10-CM | POA: Diagnosis not present

## 2023-11-09 DIAGNOSIS — E876 Hypokalemia: Secondary | ICD-10-CM | POA: Diagnosis not present

## 2023-11-09 DIAGNOSIS — E43 Unspecified severe protein-calorie malnutrition: Secondary | ICD-10-CM | POA: Diagnosis not present

## 2023-11-09 DIAGNOSIS — N189 Chronic kidney disease, unspecified: Secondary | ICD-10-CM

## 2023-11-09 DIAGNOSIS — I129 Hypertensive chronic kidney disease with stage 1 through stage 4 chronic kidney disease, or unspecified chronic kidney disease: Secondary | ICD-10-CM | POA: Diagnosis not present

## 2023-11-09 DIAGNOSIS — R34 Anuria and oliguria: Secondary | ICD-10-CM | POA: Diagnosis not present

## 2023-11-09 DIAGNOSIS — E872 Acidosis, unspecified: Secondary | ICD-10-CM | POA: Diagnosis not present

## 2023-11-09 DIAGNOSIS — I5031 Acute diastolic (congestive) heart failure: Secondary | ICD-10-CM | POA: Diagnosis not present

## 2023-11-09 DIAGNOSIS — R339 Retention of urine, unspecified: Secondary | ICD-10-CM | POA: Diagnosis not present

## 2023-11-09 DIAGNOSIS — K56600 Partial intestinal obstruction, unspecified as to cause: Secondary | ICD-10-CM | POA: Diagnosis not present

## 2023-11-09 LAB — GLUCOSE, CAPILLARY: Glucose-Capillary: 102 mg/dL — ABNORMAL HIGH (ref 70–99)

## 2023-11-09 LAB — TYPE AND SCREEN
ABO/RH(D): A NEG
Antibody Screen: NEGATIVE

## 2023-11-09 LAB — PROTIME-INR
INR: 1 (ref 0.8–1.2)
Prothrombin Time: 13 s (ref 11.4–15.2)

## 2023-11-09 LAB — TROPONIN I (HIGH SENSITIVITY): Troponin I (High Sensitivity): 6 ng/L (ref <18)

## 2023-11-09 LAB — MRSA NEXT GEN BY PCR, NASAL: MRSA by PCR Next Gen: NOT DETECTED

## 2023-11-09 MED ORDER — LABETALOL HCL 100 MG PO TABS
100.0000 mg | ORAL_TABLET | Freq: Two times a day (BID) | ORAL | Status: DC
  Administered 2023-11-09 – 2023-11-10 (×4): 100 mg via ORAL
  Filled 2023-11-09 (×5): qty 1

## 2023-11-09 MED ORDER — AMLODIPINE BESYLATE 5 MG PO TABS
5.0000 mg | ORAL_TABLET | Freq: Every evening | ORAL | Status: DC
  Administered 2023-11-09: 5 mg via ORAL
  Filled 2023-11-09: qty 1

## 2023-11-09 MED ORDER — TRAMADOL HCL 50 MG PO TABS
50.0000 mg | ORAL_TABLET | Freq: Four times a day (QID) | ORAL | Status: DC | PRN
  Administered 2023-11-09 – 2023-11-12 (×3): 50 mg via ORAL
  Filled 2023-11-09 (×3): qty 1

## 2023-11-09 MED ORDER — POLYETHYLENE GLYCOL 3350 17 G PO PACK
17.0000 g | PACK | Freq: Every day | ORAL | Status: DC | PRN
  Administered 2023-11-16: 17 g via ORAL
  Filled 2023-11-09: qty 1

## 2023-11-09 MED ORDER — MORPHINE SULFATE (PF) 2 MG/ML IV SOLN
2.0000 mg | Freq: Once | INTRAVENOUS | Status: AC
  Administered 2023-11-09: 2 mg via INTRAVENOUS
  Filled 2023-11-09: qty 1

## 2023-11-09 MED ORDER — FLECAINIDE ACETATE 50 MG PO TABS: 50.0000 mg | ORAL_TABLET | Freq: Two times a day (BID) | ORAL | Status: DC

## 2023-11-09 MED ORDER — CHLORHEXIDINE GLUCONATE CLOTH 2 % EX PADS
6.0000 | MEDICATED_PAD | Freq: Every day | CUTANEOUS | Status: DC
  Administered 2023-11-09 – 2023-11-20 (×10): 6 via TOPICAL

## 2023-11-09 MED ORDER — ASPIRIN 81 MG PO TBEC
81.0000 mg | DELAYED_RELEASE_TABLET | Freq: Every evening | ORAL | Status: DC
  Administered 2023-11-09 – 2023-11-12 (×4): 81 mg via ORAL
  Filled 2023-11-09 (×4): qty 1

## 2023-11-09 MED ORDER — ACETAMINOPHEN 325 MG PO TABS
650.0000 mg | ORAL_TABLET | Freq: Four times a day (QID) | ORAL | Status: DC | PRN
  Administered 2023-11-12: 650 mg via ORAL
  Filled 2023-11-09: qty 2

## 2023-11-09 MED ORDER — DOCUSATE SODIUM 100 MG PO CAPS
100.0000 mg | ORAL_CAPSULE | Freq: Two times a day (BID) | ORAL | Status: DC | PRN
  Administered 2023-11-10: 100 mg via ORAL
  Filled 2023-11-09: qty 1

## 2023-11-09 MED ORDER — ONDANSETRON HCL 4 MG/2ML IJ SOLN
4.0000 mg | Freq: Once | INTRAMUSCULAR | Status: AC
  Administered 2023-11-09: 4 mg via INTRAVENOUS
  Filled 2023-11-09: qty 2

## 2023-11-09 MED ORDER — ONDANSETRON HCL 4 MG/2ML IJ SOLN
4.0000 mg | Freq: Four times a day (QID) | INTRAMUSCULAR | Status: DC | PRN
  Administered 2023-11-10 – 2023-11-12 (×4): 4 mg via INTRAVENOUS
  Filled 2023-11-09 (×5): qty 2

## 2023-11-09 MED ORDER — NICARDIPINE HCL IN NACL 20-0.86 MG/200ML-% IV SOLN
3.0000 mg/h | INTRAVENOUS | Status: DC
  Administered 2023-11-09: 3 mg/h via INTRAVENOUS
  Administered 2023-11-09 – 2023-11-10 (×4): 5 mg/h via INTRAVENOUS
  Administered 2023-11-10: 7.5 mg/h via INTRAVENOUS
  Administered 2023-11-10: 5 mg/h via INTRAVENOUS
  Administered 2023-11-10: 10 mg/h via INTRAVENOUS
  Administered 2023-11-10: 7.5 mg/h via INTRAVENOUS
  Administered 2023-11-10: 5 mg/h via INTRAVENOUS
  Administered 2023-11-11 (×2): 10 mg/h via INTRAVENOUS
  Administered 2023-11-11: 7.5 mg/h via INTRAVENOUS
  Administered 2023-11-11 (×2): 10 mg/h via INTRAVENOUS
  Filled 2023-11-09 (×15): qty 200

## 2023-11-09 MED ORDER — ACETAMINOPHEN 500 MG PO TABS
1000.0000 mg | ORAL_TABLET | Freq: Once | ORAL | Status: AC
  Administered 2023-11-09: 1000 mg via ORAL
  Filled 2023-11-09: qty 2

## 2023-11-09 MED ORDER — ESMOLOL HCL-SODIUM CHLORIDE 2000 MG/100ML IV SOLN
25.0000 ug/kg/min | INTRAVENOUS | Status: DC
  Administered 2023-11-09: 99.95 ug/kg/min via INTRAVENOUS
  Administered 2023-11-09: 225 ug/kg/min via INTRAVENOUS
  Administered 2023-11-09: 25 ug/kg/min via INTRAVENOUS
  Administered 2023-11-09: 125 ug/kg/min via INTRAVENOUS
  Administered 2023-11-09: 225 ug/kg/min via INTRAVENOUS
  Administered 2023-11-10: 250 ug/kg/min via INTRAVENOUS
  Administered 2023-11-10 (×2): 225 ug/kg/min via INTRAVENOUS
  Administered 2023-11-10: 250 ug/kg/min via INTRAVENOUS
  Administered 2023-11-10: 300 ug/kg/min via INTRAVENOUS
  Administered 2023-11-10: 250 ug/kg/min via INTRAVENOUS
  Administered 2023-11-10: 225 ug/kg/min via INTRAVENOUS
  Administered 2023-11-10: 220 ug/kg/min via INTRAVENOUS
  Administered 2023-11-10 (×2): 250 ug/kg/min via INTRAVENOUS
  Administered 2023-11-10: 275 ug/kg/min via INTRAVENOUS
  Administered 2023-11-10: 225 ug/kg/min via INTRAVENOUS
  Administered 2023-11-11 (×4): 300 ug/kg/min via INTRAVENOUS
  Administered 2023-11-11: 225 ug/kg/min via INTRAVENOUS
  Administered 2023-11-11 (×4): 300 ug/kg/min via INTRAVENOUS
  Filled 2023-11-09 (×18): qty 100
  Filled 2023-11-09: qty 200
  Filled 2023-11-09 (×6): qty 100

## 2023-11-09 MED ORDER — FENTANYL CITRATE PF 50 MCG/ML IJ SOSY
25.0000 ug | PREFILLED_SYRINGE | INTRAMUSCULAR | Status: DC | PRN
  Administered 2023-11-12 (×2): 25 ug via INTRAVENOUS
  Filled 2023-11-09 (×2): qty 1

## 2023-11-09 NOTE — H&P (Signed)
 NAME:  Jack [name], MRN:  [id number], DOB:  31-Jul-1945, LOS: 0 ADMISSION DATE:  11/08/2023, CONSULTATION DATE:  11/09/2023 REFERRING MD:  Dr. Albertina - EDP , CHIEF COMPLAINT:   Back pain   History of Present Illness:  Jack [name] is a 78 y.o. male with a PMH significant for CAD, CKD, HLD, HTN, PAF, and asthma who presented to the ED with complaints of sudden onset upper back pain. On ED arrival patient was seen hypertensive with BP 180/120, all other vital signs within normal.  Lab work significant for glucose 118, BUN 33, creatinine 190 with GFR 36, all other vital signs within normal limits.  Given concern for possible dissection MR angio chest was obtained and concerning for proximal descending thoracic aorta dissection.  Given AKI CT chest without contrast was also obtained and again concerning for intramural hematoma or contained perforation.  Patient was started on esmolol  and nicardipine  drips with PCCM and vascular consults.  Pertinent  Medical History  CAD, CKD, HLD, HTN, PAF, and asthma   Significant Hospital Events: Including procedures, antibiotic start and stop dates in addition to other pertinent events   6/21 presented with upper back pain imaging concerning for aortic dissection admitted to ICU for esmolol  and nicardipine  drips  Interim History / Subjective:  Seen lying in bed on ED stretcher with reports of improved pain  Objective    Blood pressure 127/87, pulse (!) 59, temperature 97.8 F (36.6 C), temperature source Oral, resp. rate 11, height 5' 10 (1.778 m), weight 66.7 kg, SpO2 98%.       No intake or output data in the 24 hours ending 11/09/23 0948 Filed Weights   11/08/23 2103  Weight: 66.7 kg    Examination: General: Well-appearing elderly male lying in ED stretcher in no acute distress HEENT: [place]/AT, MM pink/moist, PERRL,  Neuro: Alert and oriented x 3, nonfocal CV: s1s2 regular rate and rhythm, no murmur, rubs, or gallops,  PULM: Clear to auscultation  bilaterally, no increased work of breathing, no added breath sounds GI: soft, bowel sounds active in all 4 quadrants, non-tender, non-distended, tolerating oral diet Extremities: warm/dry, no edema  Skin: no rashes or lesions   Resolved problem list   Assessment and Plan  Aortic dissection -MR angio chest was obtained on admission and is concerning for proximal descending thoracic aorta dissection.  Given AKI CT chest without contrast was also obtained and again concerning for intramural hematoma or contained perforation.  P: Vascular surgery following, appreciate assistance Continue esmolol  and nicardipine  drips for SBP less than 120 and HR ;ess than 60 Continuous telemetry Ensure adequate pain control Tentative plan to repeat CT with contrast 6/23  History of CAD Essential hypertension  Hyperlipidemia  - Home medications include Norvasc , aspirin  flecainide , Imdur , labetalol  P: Continue home medications Continuous telemetry as above Monitoring of hemodynamics in the cardiac ICU  Paroxysmal atrial fibrillation  - Unable to tolerate Eliquis , Pradaxa , or Xarelto  as anticoagulation due to drug rash.  Mains on flecainide  for rate control P: Continue flecainide  Continuous telemetry  CKD -Per chart review patient had cardiac cath in 2010 after which she has suffered with mild renal insufficiency due to suspected atheroemboli in contrast induced nephropathy.  Patient had a AV fistula placed in 2011 and later removed in 2015 as he never progressed to ESRD -On admission creatinine 1.90 with GFR of 36 compared to creatinine 1.99 with GFR 34 April 2025 P: Follow renal function  Monitor urine output Trend Bmet Avoid nephrotoxins  Ensure adequate renal perfusion  Gentle IV hydration prior to contrast   Best Practice (right click and Reselect all SmartList Selections daily)   Diet/type: Regular consistency (see orders) DVT prophylaxis SCD Pressure ulcer(s): N/A GI prophylaxis:  PPI Lines: N/A Foley:  N/A Code Status:  full code Last date of multidisciplinary goals of care discussion: Continue to update patient and family daily  Labs   CBC: Recent Labs  Lab 11/08/23 2111  WBC 8.5  NEUTROABS 4.5  HGB 16.2  HCT 47.3  MCV 90.4  PLT 251    Basic Metabolic Panel: Recent Labs  Lab 11/08/23 2111  NA 140  K 3.8  CL 103  CO2 25  GLUCOSE 118*  BUN 33*  CREATININE 1.90*  CALCIUM 9.7   GFR: Estimated Creatinine Clearance: 30.7 mL/min (A) (by C-G formula based on SCr of 1.9 mg/dL (H)). Recent Labs  Lab 11/08/23 2111  WBC 8.5    Liver Function Tests: Recent Labs  Lab 11/08/23 2111  AST 25  ALT 20  ALKPHOS 66  BILITOT 0.3  PROT 6.7  ALBUMIN 3.8   Recent Labs  Lab 11/08/23 2111  LIPASE 38   No results for input(s): AMMONIA in the last 168 hours.  ABG    Component Value Date/Time   PHART 7.385 05/18/2009 1651   PCO2ART 37.8 05/18/2009 1651   PO2ART 66.0 (L) 05/18/2009 1651   HCO3 23.0 05/18/2009 1655   TCO2 26 06/01/2009 2034   ACIDBASEDEF 2.0 05/18/2009 1655   O2SAT 62.0 05/18/2009 1655     Coagulation Profile: Recent Labs  Lab 11/09/23 0845  INR 1.0    Cardiac Enzymes: No results for input(s): CKTOTAL, CKMB, CKMBINDEX, TROPONINI in the last 168 hours.  HbA1C: No results found for: HGBA1C  CBG: No results for input(s): GLUCAP in the last 168 hours.  Review of Systems:   Please see the history of present illness. All other systems reviewed and are negative    Past Medical History:  He,  has a past medical history of Allergy, Arthritis, Asthma, Atheroembolic kidney disease (HCC), Atheroembolism, CAD (coronary artery disease), Cancer (HCC), Carotid artery occlusion, Chronic kidney disease, Complication of anesthesia, GERD (gastroesophageal reflux disease), HLD (hyperlipidemia), HTN (hypertension), Hypoglycemia, Paroxysmal atrial fibrillation (HCC), PONV (postoperative nausea and vomiting), Pruritus,  Psoriasis, Shortness of breath, and Vitamin D deficiency.   Surgical History:   Past Surgical History:  Procedure Laterality Date   AV FISTULA PLACEMENT  07-2009   Left Brachiocephalic AVF   CARDIAC CATHETERIZATION  2010   COLONOSCOPY  09/15/2012   Colonic polyp status post polypectomy. Mild sigmoid diverticulosis. Small internal hemorrhoids. Otherwise normal colonoscopy    ENDARTERECTOMY Right 08/20/2023   Procedure: ENDARTERECTOMY, CAROTID;  Surgeon: Lanis Fonda BRAVO, MD;  Location: [facility] OR;  Service: Vascular;  Laterality: Right;   LIGATION OF ARTERIOVENOUS  FISTULA Left 12/30/2013   Procedure: EXCISION OF LEFT ARM ARTERIOVENOUS  FISTULA;  Surgeon: Krystal JULIANNA Doing, MD;  Location: [facility] OR;  Service: Vascular;  Laterality: Left;   LITHOTRIPSY     PATCH ANGIOPLASTY Right 08/20/2023   Procedure: PATCH ANGIOPLASTY USING GEORGE BIOLOGIC PATCH;  Surgeon: Lanis Fonda BRAVO, MD;  Location: [facility] OR;  Service: Vascular;  Laterality: Right;   skin cancer removed     multiple basal cell     Social History:   reports that he quit smoking about 53 years ago. His smoking use included cigarettes. He has never used smokeless tobacco. He reports that he does not drink alcohol and does  not use drugs.   Family History:  His family history includes Asthma in his father; Cancer in his mother; Coronary artery disease in an other family member; Heart disease in his brother and father. There is no history of Colon cancer, Esophageal cancer, Liver cancer, Pancreatic cancer, Rectal cancer, or Stomach cancer.   Allergies Allergies  Allergen Reactions   Hydrocodone Nausea Only   Oxycodone  Nausea And Vomiting   Eliquis  [Apixaban ]     Itching and rash   Hydrochlorothiazide     anxious   Hytrin [Terazosin Hcl]     Heart races  Pt's wife unfamiliar (01/11/23)   Pradaxa  [Dabigatran  Etexilate Mesylate]     Itching and rash    Sulfa Antibiotics Itching   Xarelto  [Rivaroxaban ]     Itching and rash      Home  Medications  Prior to Admission medications   Medication Sig Start Date End Date Taking? Authorizing Provider  acetaminophen  (TYLENOL ) 500 MG tablet Take 500 mg by mouth every 6 (six) hours as needed for mild pain (pain score 1-3), moderate pain (pain score 4-6) or headache.    [provider]  amLODipine  (NORVASC ) 10 MG tablet Take 5 mg by mouth at bedtime.    [provider]  amLODipine  (NORVASC ) 5 MG tablet Take 1 tablet (5 mg total) by mouth daily for blood pressure 08/23/23     aspirin  EC 81 MG tablet Take 81 mg by mouth daily. Swallow whole.    [provider]  betamethasone  dipropionate 0.05 % lotion Apply 1 application  topically 2 (two) times daily as needed (Psoriasis).    [provider]  Cholecalciferol (VITAMIN D-3) 125 MCG (5000 UT) TABS Take 5,000 Units by mouth daily.    [provider]  Coenzyme Q10 (COQ-10) 100 MG capsule Take 100 mg by mouth daily.    [provider]  Evolocumab  (REPATHA  SURECLICK) 140 MG/ML SOAJ Inject 140 mg into the skin every 14 (fourteen) days. 06/21/23     flecainide  (TAMBOCOR ) 100 MG tablet Take 1 tablet (100 mg total) by mouth 2 (two) times daily. Patient taking differently: Take 50 mg by mouth 2 (two) times daily. 05/29/23   Wyn Jackee VEAR Mickey., NP  flecainide  (TAMBOCOR ) 50 MG tablet Take 1 (one)  Tablet by mouth two times daily 08/23/23     isosorbide  mononitrate (IMDUR ) 30 MG 24 hr tablet Take 1 tablet (30 mg total) by mouth daily. 01/17/23   Verlin Lonni BIRCH, MD  labetalol  (NORMODYNE ) 100 MG tablet Take 200 mg by mouth 2 (two) times daily.    [provider]  loratadine (CLARITIN) 10 MG tablet Take 10 mg by mouth daily.    [provider]  nitroGLYCERIN  (NITROSTAT ) 0.4 MG SL tablet Place 1 tablet (0.4 mg total) under the tongue every 5 (five) minutes as needed for chest pain. 01/17/23 08/16/23  Verlin Lonni BIRCH, MD  Omega-3 Fatty Acids (FISH OIL) 1000 MG CAPS Take 1,000 mg by  mouth daily.    [provider]  sodium chloride  (OCEAN) 0.65 % SOLN nasal spray Place 1 spray into both nostrils at bedtime as needed for congestion.    [provider]  Triamcinolone  Acetonide (KENALOG  IJ) Inject 1 application as directed as directed. 3-4 months    [provider]  triamcinolone  cream (KENALOG ) 0.1 % Apply topically 2 (two) times daily. Patient taking differently: Apply 1 Application topically 2 (two) times daily as needed (Itching). 10/22/22        Critical  care time:   CRITICAL CARE Performed by: Janelle Culton D. Harris   Total critical care time: 40 minutes  Critical care time was exclusive of separately billable procedures and treating other patients.  Critical care was necessary to treat or prevent imminent or life-threatening deterioration.  Critical care was time spent personally by me on the following activities: development of treatment plan with patient and/or surrogate as well as nursing, discussions with consultants, evaluation of patient's response to treatment, examination of patient, obtaining history from patient or surrogate, ordering and performing treatments and interventions, ordering and review of laboratory studies, ordering and review of radiographic studies, pulse oximetry and re-evaluation of patient's condition.  Shakisha Abend D. Harris, NP-C [facility] Personal contact information can be found on Amion  If no contact or response made please call [phone] 11/09/2023, 10:34 AM

## 2023-11-09 NOTE — ED Notes (Addendum)
 EDP at [facility], results dicussed, update given, VSS.

## 2023-11-09 NOTE — Consult Note (Signed)
 ED Consult    Reason for Consult:  acute aortic syndrome Referring Physician:  Dr. Albertina MRN #:  [id number]  History of Present Illness: This is a 78 y.o. male well-known to the vascular surgery service for history of left arm AV fistula and subsequent ligation and more recently right carotid endarterectomy for which he has healed well but does have some residual numbness and tingling particularly of the right chin.  Yesterday in the 1800-hour he began having upper middle back pain.  Patient states that when EMS arrived his blood pressure was 230 systolic which is usually not uncontrolled.  States that the pain initially was severe and nonradiating and denies chest pain.  Pain is now resolved mostly while he is here in the hospital and blood pressure has also been corrected.  Past Medical History:  Diagnosis Date   Allergy    Arthritis    psoriatic arthritis    Asthma    Atheroembolic kidney disease (HCC)    Atheroembolism    CAD (coronary artery disease)    hx non obst   Cancer (HCC)    basal cell x3 , last one removed - 12/12/2013, R temple area of forehead   Carotid artery occlusion    Chronic kidney disease    renal stones - tx. /w lithotripsy   Complication of anesthesia    GERD (gastroesophageal reflux disease)    HLD (hyperlipidemia)    HTN (hypertension)    Hypoglycemia    Paroxysmal atrial fibrillation (HCC)    PONV (postoperative nausea and vomiting)    Pruritus    Psoriasis    Shortness of breath    after eating , if he gets active, he might have some SOB   Vitamin D deficiency     Past Surgical History:  Procedure Laterality Date   AV FISTULA PLACEMENT  07-2009   Left Brachiocephalic AVF   CARDIAC CATHETERIZATION  2010   COLONOSCOPY  09/15/2012   Colonic polyp status post polypectomy. Mild sigmoid diverticulosis. Small internal hemorrhoids. Otherwise normal colonoscopy    ENDARTERECTOMY Right 08/20/2023   Procedure: ENDARTERECTOMY, CAROTID;  Surgeon: Lanis Fonda BRAVO, MD;  Location: [facility] OR;  Service: Vascular;  Laterality: Right;   LIGATION OF ARTERIOVENOUS  FISTULA Left 12/30/2013   Procedure: EXCISION OF LEFT ARM ARTERIOVENOUS  FISTULA;  Surgeon: Krystal JULIANNA Doing, MD;  Location: [facility] OR;  Service: Vascular;  Laterality: Left;   LITHOTRIPSY     PATCH ANGIOPLASTY Right 08/20/2023   Procedure: PATCH ANGIOPLASTY USING GEORGE BIOLOGIC PATCH;  Surgeon: Lanis Fonda BRAVO, MD;  Location: [facility] OR;  Service: Vascular;  Laterality: Right;   skin cancer removed     multiple basal cell    Allergies  Allergen Reactions   Hydrocodone Nausea Only   Oxycodone  Nausea And Vomiting   Eliquis  [Apixaban ]     Itching and rash   Hydrochlorothiazide     anxious   Hytrin [Terazosin Hcl]     Heart races  Pt's wife unfamiliar (01/11/23)   Pradaxa  [Dabigatran  Etexilate Mesylate]     Itching and rash    Sulfa Antibiotics Itching   Xarelto  [Rivaroxaban ]     Itching and rash     Prior to Admission medications   Medication Sig Start Date End Date Taking? Authorizing Provider  acetaminophen  (TYLENOL ) 500 MG tablet Take 500 mg by mouth every 6 (six) hours as needed for mild pain (pain score 1-3), moderate pain (pain score 4-6) or headache.    [provider]  amLODipine  (NORVASC ) 10 MG tablet Take 5 mg by mouth at bedtime.    [provider]  amLODipine  (NORVASC ) 5 MG tablet Take 1 tablet (5 mg total) by mouth daily for blood pressure 08/23/23     aspirin  EC 81 MG tablet Take 81 mg by mouth daily. Swallow whole.    [provider]  betamethasone  dipropionate 0.05 % lotion Apply 1 application  topically 2 (two) times daily as needed (Psoriasis).    [provider]  Cholecalciferol (VITAMIN D-3) 125 MCG (5000 UT) TABS Take 5,000 Units by mouth daily.    [provider]  Coenzyme Q10 (COQ-10) 100 MG capsule Take 100 mg by mouth daily.    [provider]  Evolocumab  (REPATHA  SURECLICK) 140 MG/ML SOAJ Inject 140 mg into the skin  every 14 (fourteen) days. 06/21/23     flecainide  (TAMBOCOR ) 100 MG tablet Take 1 tablet (100 mg total) by mouth 2 (two) times daily. Patient taking differently: Take 50 mg by mouth 2 (two) times daily. 05/29/23   Wyn Jackee VEAR Mickey., NP  flecainide  (TAMBOCOR ) 50 MG tablet Take 1 (one)  Tablet by mouth two times daily 08/23/23     isosorbide  mononitrate (IMDUR ) 30 MG 24 hr tablet Take 1 tablet (30 mg total) by mouth daily. 01/17/23   Verlin Lonni BIRCH, MD  labetalol  (NORMODYNE ) 100 MG tablet Take 200 mg by mouth 2 (two) times daily.    [provider]  loratadine (CLARITIN) 10 MG tablet Take 10 mg by mouth daily.    [provider]  nitroGLYCERIN  (NITROSTAT ) 0.4 MG SL tablet Place 1 tablet (0.4 mg total) under the tongue every 5 (five) minutes as needed for chest pain. 01/17/23 08/16/23  Verlin Lonni BIRCH, MD  Omega-3 Fatty Acids (FISH OIL) 1000 MG CAPS Take 1,000 mg by mouth daily.    [provider]  sodium chloride  (OCEAN) 0.65 % SOLN nasal spray Place 1 spray into both nostrils at bedtime as needed for congestion.    [provider]  Triamcinolone  Acetonide (KENALOG  IJ) Inject 1 application as directed as directed. 3-4 months    [provider]  triamcinolone  cream (KENALOG ) 0.1 % Apply topically 2 (two) times daily. Patient taking differently: Apply 1 Application topically 2 (two) times daily as needed (Itching). 10/22/22       Social History   Socioeconomic History   Marital status: Married    Spouse name: Not on file   Number of children: 0   Years of education: Not on file   Highest education level: Not on file  Occupational History   Occupation: Retired from a Education officer, environmental  Tobacco Use   Smoking status: Former    Current packs/day: 0.00    Types: Cigarettes    Quit date: 05/21/1970    Years since quitting: 53.5   Smokeless tobacco: Never   Tobacco comments:    quit 40+ years ago   Vaping Use   Vaping status: Never Used   Substance and Sexual Activity   Alcohol use: No   Drug use: No   Sexual activity: Not on file  Other Topics Concern   Not on file  Social History Narrative   Married, lives with wife; Designer, television/film set.   L[facility] in [facility]   Social Drivers of Health   Financial Resource Strain: Not on file  Food Insecurity: No Food Insecurity (08/20/2023)   Hunger Vital Sign    Worried About Running Out of Food in the Last  Year: Never true    Ran Out of Food in the Last Year: Never true  Transportation Needs: No Transportation Needs (08/20/2023)   PRAPARE - Administrator, Civil Service (Medical): No    Lack of Transportation (Non-Medical): No  Physical Activity: Not on file  Stress: Not on file  Social Connections: Socially Integrated (08/20/2023)   Social Connection and Isolation Panel    Frequency of Communication with Friends and Family: More than three times a week    Frequency of Social Gatherings with Friends and Family: More than three times a week    Attends Religious Services: More than 4 times per year    Active Member of [employer] or Organizations: Yes    Attends Banker Meetings: 1 to 4 times per year    Marital Status: Married  Catering manager Violence: Not At Risk (08/20/2023)   Humiliation, Afraid, Rape, and Kick questionnaire    Fear of Current or Ex-Partner: No    Emotionally Abused: No    Physically Abused: No    Sexually Abused: No     Family History  Problem Relation Age of Onset   Cancer Mother    Heart disease Father        Before age 34   Asthma Father    Heart disease Brother        After 60 years of age   Coronary artery disease Other        family hx   Colon cancer Neg Hx    Esophageal cancer Neg Hx    Liver cancer Neg Hx    Pancreatic cancer Neg Hx    Rectal cancer Neg Hx    Stomach cancer Neg Hx     Review of Systems  Constitutional: Negative.   HENT: Negative.    Eyes: Negative.   Respiratory: Negative.     Cardiovascular: Negative.   Musculoskeletal:  Positive for back pain.  Skin: Negative.   Neurological:  Positive for sensory change.  Endo/Heme/Allergies: Negative.   Psychiatric/Behavioral: Negative.        Physical Examination  Vitals:   11/09/23 0715 11/09/23 0806  BP:  (!) 186/114  Pulse: 69 69  Resp: 11 16  Temp:  97.8 F (36.6 C)  SpO2: 99% 100%   Body mass index is 21.09 kg/m.  Physical Exam HENT:     Head: Normocephalic.     Nose: Nose normal.   Eyes:     Pupils: Pupils are equal, round, and reactive to light.   Neck:     Comments: Well-healed right neck incision Cardiovascular:     Pulses:          Femoral pulses are 2+ on the right side and 2+ on the left side.      Popliteal pulses are 2+ on the right side and 2+ on the left side.       Posterior tibial pulses are 2+ on the right side and 1+ on the left side.  Abdominal:     General: Abdomen is flat.     Palpations: Abdomen is soft. There is no mass.   Musculoskeletal:        General: Normal range of motion.   Skin:    Capillary Refill: Capillary refill takes less than 2 seconds.   Neurological:     General: No focal deficit present.     Mental Status: He is alert.      CBC    Component Value Date/Time  WBC 8.5 11/08/2023 2111   RBC 5.23 11/08/2023 2111   HGB 16.2 11/08/2023 2111   HCT 47.3 11/08/2023 2111   PLT 251 11/08/2023 2111   MCV 90.4 11/08/2023 2111   MCH 31.0 11/08/2023 2111   MCHC 34.2 11/08/2023 2111   RDW 13.5 11/08/2023 2111   LYMPHSABS 2.3 11/08/2023 2111   MONOABS 1.2 (H) 11/08/2023 2111   EOSABS 0.3 11/08/2023 2111   BASOSABS 0.1 11/08/2023 2111    BMET    Component Value Date/Time   NA 140 11/08/2023 2111   K 3.8 11/08/2023 2111   CL 103 11/08/2023 2111   CO2 25 11/08/2023 2111   GLUCOSE 118 (H) 11/08/2023 2111   BUN 33 (H) 11/08/2023 2111   CREATININE 1.90 (H) 11/08/2023 2111   CALCIUM 9.7 11/08/2023 2111   GFRNONAA 36 (L) 11/08/2023 2111   GFRAA 56  (L) 12/24/2013 1316    COAGS: Lab Results  Component Value Date   INR 1.0 08/20/2023   INR 0.99 05/17/2009     Non-Invasive Vascular Imaging:   CT without contrast IMPRESSION: 1. Along the posterior and lateral aspect of the proximal descending thoracic aorta there is focal area of attenuation peripheral to atherosclerotic calcifications and corresponding to the MRI findings. Although study is limited by lack of IV contrast material imaging findings are concerning for focal intramural hematoma or contained perforation. A contrast enhanced CTA of the chest would be the study of choice to confirm this abnormality. 2. Aneurysmal dilatation of the descending thoracic aorta measures 4.4 cm at the level of the hiatus. 3. Partially visualized severe chronic right hydronephrosis with diffuse right renal parenchymal atrophy. 4. 5 mm perifissural nodule along the minor fissure. Likely perifissural lymph node. No follow-up imaging recommended. 5.  Aortic Atherosclerosis (ICD10-I70.0).   ASSESSMENT/PLAN: This is a 78 y.o. male here with acute aortic syndrome appears to be either acute IMH or PAU.  Studies are limited without contrast.  Given patient's history of underlying kidney disease and improvement in pain we will hold on contrasted study today and plan for a repeat study with contrast in 48 to 72 hours.  I have recommended admission to the hospital for blood pressure control and currently he is very well-controlled and symptoms have resolved.  All questions were answered with the patient and his wife and they demonstrate good understanding.  Naomy Esham C. Sheree, MD [facility] Office: [phone] Pager: [phone]

## 2023-11-09 NOTE — Plan of Care (Signed)

## 2023-11-09 NOTE — ED Provider Notes (Signed)
 Physical Exam  BP (!) 186/114   Pulse 69   Temp 97.8 F (36.6 C) (Oral)   Resp 16   Ht 5' 10 (1.778 m)   Wt 66.7 kg   SpO2 100%   BMI 21.09 kg/m   Physical Exam Constitutional:      General: He is not in acute distress.    Appearance: Normal appearance.  HENT:     Head: Normocephalic and atraumatic.     Nose: No congestion or rhinorrhea.   Eyes:     General:        Right eye: No discharge.        Left eye: No discharge.     Extraocular Movements: Extraocular movements intact.     Pupils: Pupils are equal, round, and reactive to light.    Cardiovascular:     Rate and Rhythm: Normal rate and regular rhythm.     Heart sounds: No murmur heard. Pulmonary:     Effort: No respiratory distress.     Breath sounds: No wheezing or rales.  Abdominal:     General: There is no distension.     Tenderness: There is no abdominal tenderness.   Musculoskeletal:        General: Tenderness present. Normal range of motion.     Cervical back: Normal range of motion.   Skin:    General: Skin is warm and dry.   Neurological:     General: No focal deficit present.     Mental Status: He is alert.     Procedures  .Critical Care  Performed by: Albertina Dixon, MD Authorized by: Albertina Dixon, MD   Critical care provider statement:    Critical care time (minutes):  30   Critical care was necessary to treat or prevent imminent or life-threatening deterioration of the following conditions: Aortic emergency requiring titratable drips.   Critical care was time spent personally by me on the following activities:  Development of treatment plan with patient or surrogate, discussions with consultants, evaluation of patient's response to treatment, examination of patient, ordering and review of laboratory studies, ordering and review of radiographic studies, ordering and performing treatments and interventions, pulse oximetry, re-evaluation of patient's condition and review of old  charts   ED Course / MDM   Clinical Course as of 11/09/23 0820  Sat Nov 09, 2023  0535 Abnormal MRI. Most likely need a noncon chest CT to confirm. Radiology concerned about possible contained perforation of proximal descending thoracic aorta. [CG]  0737 GFR, Estimated(!): 36 [MK]    Clinical Course User Index [CG] Ruthell Lonni FALCON, PA-C [MK] Aideliz Garmany, Dixon, MD   Medical Decision Making Amount and/or Complexity of Data Reviewed Labs: ordered. Decision-making details documented in ED Course. Radiology: ordered.  Risk OTC drugs. Prescription drug management.   Patient received in handoff.  Severe thoracic back pain with unexplained significant hypertension.  Patient has a history of an acquired solitary kidney with a decreased GFR and initial imaging with MR angiography was delayed given protocol confusion from MRI techs but ultimately MR angiography obtained overnight concern for an aortic lesion new T1 with aneurysms.  Recommended Noncon CT chest which is pending at the time of signout to me.  This scan confirms concern for intramural hematoma versus contained perforation.  I spoke with the vascular surgeon on-call Dr. Sheree who is recommending blood pressure control, ICU admission and repeat CT angiography in 48 hours.  I had a very extended discussion with the patient about the ultimate  need for contrasted imaging and he understands that the benefits of obtaining the scan outweigh the risks of a theoretical CIN pathology with a GFR greater than 30.  Patient started on nicardipine  and esmolol  and admitted to the ICU       Albertina Dixon, MD 11/09/23 [phone]

## 2023-11-09 NOTE — ED Notes (Signed)
 NP at [facility].

## 2023-11-10 DIAGNOSIS — N179 Acute kidney failure, unspecified: Secondary | ICD-10-CM

## 2023-11-10 DIAGNOSIS — E871 Hypo-osmolality and hyponatremia: Secondary | ICD-10-CM

## 2023-11-10 DIAGNOSIS — I161 Hypertensive emergency: Secondary | ICD-10-CM

## 2023-11-10 DIAGNOSIS — R339 Retention of urine, unspecified: Secondary | ICD-10-CM

## 2023-11-10 DIAGNOSIS — I71012 Dissection of descending thoracic aorta: Secondary | ICD-10-CM | POA: Diagnosis not present

## 2023-11-10 LAB — CBC
HCT: 41.5 % (ref 39.0–52.0)
Hemoglobin: 14.2 g/dL (ref 13.0–17.0)
MCH: 30.6 pg (ref 26.0–34.0)
MCHC: 34.2 g/dL (ref 30.0–36.0)
MCV: 89.4 fL (ref 80.0–100.0)
Platelets: 217 10*3/uL (ref 150–400)
RBC: 4.64 MIL/uL (ref 4.22–5.81)
RDW: 13.6 % (ref 11.5–15.5)
WBC: 10.1 10*3/uL (ref 4.0–10.5)
nRBC: 0 % (ref 0.0–0.2)

## 2023-11-10 LAB — BASIC METABOLIC PANEL WITH GFR
Anion gap: 5 (ref 5–15)
BUN: 31 mg/dL — ABNORMAL HIGH (ref 8–23)
CO2: 21 mmol/L — ABNORMAL LOW (ref 22–32)
Calcium: 8.2 mg/dL — ABNORMAL LOW (ref 8.9–10.3)
Chloride: 108 mmol/L (ref 98–111)
Creatinine, Ser: 2.37 mg/dL — ABNORMAL HIGH (ref 0.61–1.24)
GFR, Estimated: 28 mL/min — ABNORMAL LOW (ref >60)
Glucose, Bld: 113 mg/dL — ABNORMAL HIGH (ref 70–99)
Potassium: 4.2 mmol/L (ref 3.5–5.1)
Sodium: 134 mmol/L — ABNORMAL LOW (ref 135–145)

## 2023-11-10 LAB — GLUCOSE, CAPILLARY: Glucose-Capillary: 125 mg/dL — ABNORMAL HIGH (ref 70–99)

## 2023-11-10 LAB — MAGNESIUM: Magnesium: 2 mg/dL (ref 1.7–2.4)

## 2023-11-10 LAB — PHOSPHORUS: Phosphorus: 3.2 mg/dL (ref 2.5–4.6)

## 2023-11-10 MED ORDER — PROCHLORPERAZINE EDISYLATE 10 MG/2ML IJ SOLN
10.0000 mg | Freq: Four times a day (QID) | INTRAMUSCULAR | Status: DC | PRN
  Administered 2023-11-10 – 2023-11-12 (×3): 10 mg via INTRAVENOUS
  Filled 2023-11-10 (×5): qty 2

## 2023-11-10 MED ORDER — DIPHENHYDRAMINE HCL 50 MG/ML IJ SOLN
25.0000 mg | Freq: Once | INTRAMUSCULAR | Status: AC
  Administered 2023-11-10: 25 mg via INTRAVENOUS
  Filled 2023-11-10: qty 1

## 2023-11-10 MED ORDER — AMLODIPINE BESYLATE 10 MG PO TABS
10.0000 mg | ORAL_TABLET | Freq: Every evening | ORAL | Status: DC
  Administered 2023-11-10 – 2023-11-12 (×3): 10 mg via ORAL
  Filled 2023-11-10 (×3): qty 1

## 2023-11-10 MED ORDER — LACTATED RINGERS IV SOLN: INTRAVENOUS | Status: DC

## 2023-11-10 MED ORDER — ORAL CARE MOUTH RINSE: 15.0000 mL | OROMUCOSAL | Status: DC | PRN

## 2023-11-10 MED ORDER — ISOSORBIDE MONONITRATE ER 30 MG PO TB24
30.0000 mg | ORAL_TABLET | Freq: Every day | ORAL | Status: DC
  Administered 2023-11-10 – 2023-11-11 (×2): 30 mg via ORAL
  Filled 2023-11-10 (×2): qty 1

## 2023-11-10 MED ORDER — TAMSULOSIN HCL 0.4 MG PO CAPS
0.4000 mg | ORAL_CAPSULE | Freq: Every day | ORAL | Status: DC
  Administered 2023-11-10 – 2023-11-12 (×3): 0.4 mg via ORAL
  Filled 2023-11-10 (×3): qty 1

## 2023-11-10 NOTE — Progress Notes (Signed)
 NAME:  Jack [name], MRN:  [id number], DOB:  1946/02/22, LOS: 1 ADMISSION DATE:  11/08/2023, CONSULTATION DATE:  11/09/2023 REFERRING MD:  Dr. Albertina - EDP , CHIEF COMPLAINT:   Back pain   History of Present Illness:  CHANTZ MONTEFUSCO is a 78 y.o. male with a PMH significant for CAD, CKD, HLD, HTN, PAF, and asthma who presented to the ED with complaints of sudden onset upper back pain. On ED arrival patient was seen hypertensive with BP 180/120, all other vital signs within normal.  Lab work significant for glucose 118, BUN 33, creatinine 190 with GFR 36, all other vital signs within normal limits.  Given concern for possible dissection MR angio chest was obtained and concerning for proximal descending thoracic aorta dissection.  Given AKI CT chest without contrast was also obtained and again concerning for intramural hematoma or contained perforation.  Patient was started on esmolol  and nicardipine  drips with PCCM and vascular consults.  Pertinent  Medical History  CAD, CKD, HLD, HTN, PAF, and asthma   Significant Hospital Events: Including procedures, antibiotic start and stop dates in addition to other pertinent events   6/21 presented with upper back pain imaging concerning for aortic dissection admitted to ICU for esmolol  and nicardipine  drips  Interim History / Subjective:  Ocular migrain with nausea today, zofran  didn't work well enough.  Dizzy episode when OOB this morning.  Still on esmolol ; on and off nicardipine . Not making much urine today.   Objective    Blood pressure 105/61, pulse (!) 58, temperature 98 F (36.7 C), temperature source Oral, resp. rate 12, height 5' 10 (1.778 m), weight 66.7 kg, SpO2 94%.        Intake/Output Summary (Last 24 hours) at 11/10/2023 0933 Last data filed at 11/10/2023 0900 Gross per 24 hour  Intake 2029.42 ml  Output 200 ml  Net 1829.42 ml   Filed Weights   11/08/23 2103  Weight: 66.7 kg    Examination: General: elderly man lying in bed in  NAD HEENT: [place]/AT, eyes anicteric Neuro: awake, alert, moving all extremities. Able to sit up unassisted CV: S1S2, RRR  PULM: breathing comfortably on RA, CTAB, no conversational dyspnea GI: soft, NT Extremities: no peripheral edema, symmetric DP & PT pulses Skin: warm, dry, no rashes  Na+  134 BUN 31 Cr 2.37 WBC 10.1 H/H 14.2/41.5 Platelets 217  Resolved problem list   Assessment and Plan  Acute aortic dissection, type B Hypertensive emergency -impulse control-- goal HR <80, SBP<120. Con't esmolol  and nicardipine  to achieve these goals -restart PTA Imdur , labetalol . Increase amlodipine  to 10mg  daily -tele monitoring -needs repeat CTA once renal function improves; reassess tomorrow -pain control  History of CAD Essential hypertension  Hyperlipidemia  -hold PTA flecainide  -con't PTA amlodipine , Imdur , labetalol   -con't aspirin  -intolerant to statins due to myalgias  Paroxysmal atrial fibrillation  - Unable to tolerate Eliquis , Pradaxa , or Xarelto  as anticoagulation due to drug rash.  Long-term flecainide  for rate control. -flecainide  on hold -Bblockers for rate control -holding AC; which he is not on at home   AKI on CKD, single kidney -Per chart review patient had cardiac cath in 2010 after which she has suffered with mild renal insufficiency due to suspected atheroemboli in contrast induced nephropathy.  Patient had a AV fistula placed in 2011 and later removed in 2015 as he never progressed to ESRD Urinary retention- may be due to side effect of antiemetrics -straight cath PRN; starting flomax -LR x 24hrs -ad lib  PO intake -renally dose meds, avoid nephrotoxic meds -need to wait on CTA until renal function is optimized  Hyponatremia -monitor, avoid hypotonic fluids  Wife and patient updated at bedside today.  Best Practice (right click and Reselect all SmartList Selections daily)   Diet/type: Regular consistency (see orders) DVT prophylaxis SCD Pressure  ulcer(s): N/A GI prophylaxis: PPI Lines: N/A Foley:  N/A Code Status:  full code Last date of multidisciplinary goals of care discussion: Continue to update patient and family daily  Labs   CBC: Recent Labs  Lab 11/08/23 2111 11/10/23 0226  WBC 8.5 10.1  NEUTROABS 4.5  --   HGB 16.2 14.2  HCT 47.3 41.5  MCV 90.4 89.4  PLT 251 217    Basic Metabolic Panel: Recent Labs  Lab 11/08/23 2111 11/10/23 0226  NA 140 134*  K 3.8 4.2  CL 103 108  CO2 25 21*  GLUCOSE 118* 113*  BUN 33* 31*  CREATININE 1.90* 2.37*  CALCIUM 9.7 8.2*  MG  --  2.0  PHOS  --  3.2   GFR: Estimated Creatinine Clearance: 24.6 mL/min (A) (by C-G formula based on SCr of 2.37 mg/dL (H)). Recent Labs  Lab 11/08/23 2111 11/10/23 0226  WBC 8.5 10.1    Liver Function Tests: Recent Labs  Lab 11/08/23 2111  AST 25  ALT 20  ALKPHOS 66  BILITOT 0.3  PROT 6.7  ALBUMIN 3.8   Recent Labs  Lab 11/08/23 2111  LIPASE 38   No results for input(s): AMMONIA in the last 168 hours.  ABG    Component Value Date/Time   PHART 7.385 05/18/2009 1651   PCO2ART 37.8 05/18/2009 1651   PO2ART 66.0 (L) 05/18/2009 1651   HCO3 23.0 05/18/2009 1655   TCO2 26 06/01/2009 2034   ACIDBASEDEF 2.0 05/18/2009 1655   O2SAT 62.0 05/18/2009 1655        Critical care time:     This patient is critically ill with multiple organ system failure which requires frequent high complexity decision making, assessment, support, evaluation, and titration of therapies. This was completed through the application of advanced monitoring technologies and extensive interpretation of multiple databases. During this encounter critical care time was devoted to patient care services described in this note for 36 minutes.  Leita SHAUNNA Gaskins, DO 11/10/23 4:47 PM [facility]  For contact information, see Amion. If no response to pager, please call PCCM consult pager. After hours, 7PM- 7AM, please call Elink.

## 2023-11-10 NOTE — Plan of Care (Signed)
 The patient remains on [facility] as of time of writing. The patient is AA+Ox4. Goal SBP < 120 mmHg, HR < 60 BPM. The patient remains on active infusions of Nicardipine , Esmolol , and LR. PIV access only. The patient's spouse remains at bedside overnight. Fall precautions initiated overnight by this RN. 2 L/min of supplemental O2 via [place] initiated overnight. I+O cath x 1 overnight for urinary retention; 450 mL removed.   Problem: Education: Goal: Knowledge of General Education information will improve Description: Including pain rating scale, medication(s)/side effects and non-pharmacologic comfort measures Outcome: Progressing   Problem: Health Behavior/Discharge Planning: Goal: Ability to manage health-related needs will improve Outcome: Progressing   Problem: Clinical Measurements: Goal: Ability to maintain clinical measurements within normal limits will improve Outcome: Progressing Goal: Will remain free from infection Outcome: Progressing Goal: Diagnostic test results will improve Outcome: Progressing Goal: Respiratory complications will improve Outcome: Progressing Goal: Cardiovascular complication will be avoided Outcome: Progressing   Problem: Activity: Goal: Risk for activity intolerance will decrease Outcome: Progressing   Problem: Nutrition: Goal: Adequate nutrition will be maintained Outcome: Progressing   Problem: Coping: Goal: Level of anxiety will decrease Outcome: Progressing   Problem: Elimination: Goal: Will not experience complications related to bowel motility Outcome: Progressing Goal: Will not experience complications related to urinary retention Outcome: Progressing   Problem: Pain Managment: Goal: General experience of comfort will improve and/or be controlled Outcome: Progressing   Problem: Safety: Goal: Ability to remain free from injury will improve Outcome: Progressing   Problem: Skin Integrity: Goal: Risk for impaired skin integrity will  decrease Outcome: Progressing

## 2023-11-10 NOTE — Plan of Care (Signed)
  Problem: Education: Goal: Knowledge of General Education information will improve Description: Including pain rating scale, medication(s)/side effects and non-pharmacologic comfort measures Outcome: Progressing   Problem: Health Behavior/Discharge Planning: Goal: Ability to manage health-related needs will improve Outcome: Not Progressing   Problem: Clinical Measurements: Goal: Ability to maintain clinical measurements within normal limits will improve Outcome: Not Progressing Goal: Will remain free from infection Outcome: Not Progressing Goal: Diagnostic test results will improve Outcome: Not Progressing Goal: Respiratory complications will improve Outcome: Progressing Goal: Cardiovascular complication will be avoided Outcome: Not Progressing   Problem: Activity: Goal: Risk for activity intolerance will decrease Outcome: Progressing   Problem: Nutrition: Goal: Adequate nutrition will be maintained Outcome: Not Progressing   Problem: Coping: Goal: Level of anxiety will decrease Outcome: Progressing   Problem: Elimination: Goal: Will not experience complications related to bowel motility Outcome: Progressing Goal: Will not experience complications related to urinary retention Outcome: Not Progressing   Problem: Pain Managment: Goal: General experience of comfort will improve and/or be controlled Outcome: Not Progressing   Problem: Safety: Goal: Ability to remain free from injury will improve Outcome: Not Progressing   Problem: Skin Integrity: Goal: Risk for impaired skin integrity will decrease Outcome: Progressing

## 2023-11-10 NOTE — Plan of Care (Signed)

## 2023-11-10 NOTE — Progress Notes (Addendum)
 9151 patient wanted to use BSC to have a BM with in 2 mins patient reports nausea like he was going to pass out states its his glucose but glucose was 125. Patients Hr went from 60 to 52 patient placed back in bed  BP 103/66 sp02 88%. educated that no more getting out the bed for his safety wife at bedside Cardene  stopped.    0900 BP 97/66 esmolol  decreased SP02 increased to 93%  0945 patient wife reports that patient had ocular vision migraine in left eye at 0925 this morning. He has these at home in both eyes but not usually together  1045 patient has only put of 50cc of urine this morning bladder scan showed 354 ml patient encouraged to urinate he's a bit lethargic from the 25mg  IV benadryl.  1200 patient up to chair did well no dizziness BP and HR stated the same, patient able to brush teeth and have some ADL care completed. IV fluid started  1600 patient still having decrease urine out put bladder scanned again noted straight cath out   1715 patient wife concerned with swelling in right forearm wants BP decreased education on BP and medications both right arm IVs flush and draw back blood all IVs combined to right wrist and BP cuff moved to left wrist patient agreeable fistula was reversed 2015

## 2023-11-10 NOTE — Progress Notes (Signed)
  Progress Note    11/10/2023 10:05 AM * No surgery found *  Subjective: Complains of nausea  Vitals:   11/10/23 0930 11/10/23 0945  BP: 121/69 108/73  Pulse: (!) 58 60  Resp: 12 17  Temp:    SpO2: 93% 91%    Physical Exam: Awake alert and oriented Abdomen is soft and nontender Bilateral common femoral pulses are palpable  CBC    Component Value Date/Time   WBC 10.1 11/10/2023 0226   RBC 4.64 11/10/2023 0226   HGB 14.2 11/10/2023 0226   HCT 41.5 11/10/2023 0226   PLT 217 11/10/2023 0226   MCV 89.4 11/10/2023 0226   MCH 30.6 11/10/2023 0226   MCHC 34.2 11/10/2023 0226   RDW 13.6 11/10/2023 0226   LYMPHSABS 2.3 11/08/2023 2111   MONOABS 1.2 (H) 11/08/2023 2111   EOSABS 0.3 11/08/2023 2111   BASOSABS 0.1 11/08/2023 2111    BMET    Component Value Date/Time   NA 134 (L) 11/10/2023 0226   K 4.2 11/10/2023 0226   CL 108 11/10/2023 0226   CO2 21 (L) 11/10/2023 0226   GLUCOSE 113 (H) 11/10/2023 0226   BUN 31 (H) 11/10/2023 0226   CREATININE 2.37 (H) 11/10/2023 0226   CALCIUM 8.2 (L) 11/10/2023 0226   GFRNONAA 28 (L) 11/10/2023 0226   GFRAA 56 (L) 12/24/2013 1316    INR    Component Value Date/Time   INR 1.0 11/09/2023 0845     Intake/Output Summary (Last 24 hours) at 11/10/2023 1005 Last data filed at 11/10/2023 0900 Gross per 24 hour  Intake 1961.5 ml  Output 200 ml  Net 1761.5 ml     Assessment:  78 y.o. male is s/p here with acute aortic syndrome either PACU versus IMH evaluated with noncontrast studies.  Blood pressure and pain well-controlled today but complaining of nausea.  Plan: Likely CTA chest and pelvis tomorrow versus Tuesday pending tomorrow's labs.   Kinisha Soper C. Sheree, MD [facility] [place] Office: [phone] Pager: [phone]  11/10/2023 10:05 AM

## 2023-11-11 ENCOUNTER — Other Ambulatory Visit (HOSPITAL_COMMUNITY): Payer: Self-pay

## 2023-11-11 ENCOUNTER — Telehealth: Payer: Self-pay

## 2023-11-11 ENCOUNTER — Inpatient Hospital Stay (HOSPITAL_COMMUNITY)

## 2023-11-11 DIAGNOSIS — N179 Acute kidney failure, unspecified: Secondary | ICD-10-CM | POA: Diagnosis not present

## 2023-11-11 DIAGNOSIS — E785 Hyperlipidemia, unspecified: Secondary | ICD-10-CM | POA: Diagnosis not present

## 2023-11-11 DIAGNOSIS — I5031 Acute diastolic (congestive) heart failure: Secondary | ICD-10-CM

## 2023-11-11 DIAGNOSIS — I161 Hypertensive emergency: Secondary | ICD-10-CM | POA: Diagnosis not present

## 2023-11-11 DIAGNOSIS — I71012 Dissection of descending thoracic aorta: Secondary | ICD-10-CM | POA: Diagnosis not present

## 2023-11-11 LAB — ECHOCARDIOGRAM LIMITED
Area-P 1/2: 3.42 cm2
Height: 70 in
S' Lateral: 3.6 cm
Weight: 2084.67 [oz_av]

## 2023-11-11 LAB — BASIC METABOLIC PANEL WITH GFR
Anion gap: 11 (ref 5–15)
BUN: 35 mg/dL — ABNORMAL HIGH (ref 8–23)
CO2: 15 mmol/L — ABNORMAL LOW (ref 22–32)
Calcium: 8.4 mg/dL — ABNORMAL LOW (ref 8.9–10.3)
Chloride: 105 mmol/L (ref 98–111)
Creatinine, Ser: 2.58 mg/dL — ABNORMAL HIGH (ref 0.61–1.24)
GFR, Estimated: 25 mL/min — ABNORMAL LOW (ref >60)
Glucose, Bld: 103 mg/dL — ABNORMAL HIGH (ref 70–99)
Potassium: 4.8 mmol/L (ref 3.5–5.1)
Sodium: 131 mmol/L — ABNORMAL LOW (ref 135–145)

## 2023-11-11 LAB — CBC
HCT: 41.5 % (ref 39.0–52.0)
Hemoglobin: 13.8 g/dL (ref 13.0–17.0)
MCH: 30.7 pg (ref 26.0–34.0)
MCHC: 33.3 g/dL (ref 30.0–36.0)
MCV: 92.2 fL (ref 80.0–100.0)
Platelets: 180 10*3/uL (ref 150–400)
RBC: 4.5 MIL/uL (ref 4.22–5.81)
RDW: 13.4 % (ref 11.5–15.5)
WBC: 10.1 10*3/uL (ref 4.0–10.5)
nRBC: 0 % (ref 0.0–0.2)

## 2023-11-11 LAB — CREATININE, URINE, RANDOM: Creatinine, Urine: 156 mg/dL

## 2023-11-11 LAB — HEPATIC FUNCTION PANEL
ALT: 14 U/L (ref 0–44)
AST: 14 U/L — ABNORMAL LOW (ref 15–41)
Albumin: 2.6 g/dL — ABNORMAL LOW (ref 3.5–5.0)
Alkaline Phosphatase: 55 U/L (ref 38–126)
Bilirubin, Direct: 0.2 mg/dL (ref 0.0–0.2)
Indirect Bilirubin: 0.7 mg/dL (ref 0.3–0.9)
Total Bilirubin: 0.9 mg/dL (ref 0.0–1.2)
Total Protein: 5.3 g/dL — ABNORMAL LOW (ref 6.5–8.1)

## 2023-11-11 LAB — OSMOLALITY, URINE: Osmolality, Ur: 491 mosm/kg (ref 300–900)

## 2023-11-11 LAB — BRAIN NATRIURETIC PEPTIDE: B Natriuretic Peptide: 523.9 pg/mL — ABNORMAL HIGH (ref 0.0–100.0)

## 2023-11-11 LAB — VITAMIN B12: Vitamin B-12: 501 pg/mL (ref 180–914)

## 2023-11-11 LAB — AMMONIA: Ammonia: 25 umol/L (ref 9–35)

## 2023-11-11 LAB — LACTIC ACID, PLASMA: Lactic Acid, Venous: 0.9 mmol/L (ref 0.5–1.9)

## 2023-11-11 LAB — SODIUM, URINE, RANDOM: Sodium, Ur: <10 mmol/L

## 2023-11-11 LAB — OSMOLALITY: Osmolality: 284 mosm/kg (ref 275–295)

## 2023-11-11 LAB — TSH: TSH: 5.158 u[IU]/mL — ABNORMAL HIGH (ref 0.350–4.500)

## 2023-11-11 MED ORDER — METOCLOPRAMIDE HCL 5 MG/ML IJ SOLN
5.0000 mg | Freq: Three times a day (TID) | INTRAMUSCULAR | Status: DC
  Administered 2023-11-11 – 2023-11-13 (×6): 5 mg via INTRAVENOUS
  Filled 2023-11-11 (×6): qty 2

## 2023-11-11 MED ORDER — LABETALOL HCL 5 MG/ML IV SOLN
0.5000 mg/min | Status: DC
  Administered 2023-11-11 (×2): 0.5 mg/min via INTRAVENOUS
  Filled 2023-11-11 (×2): qty 80

## 2023-11-11 MED ORDER — PANTOPRAZOLE SODIUM 40 MG PO TBEC
40.0000 mg | DELAYED_RELEASE_TABLET | Freq: Every day | ORAL | Status: DC
  Administered 2023-11-11 – 2023-11-12 (×2): 40 mg via ORAL
  Filled 2023-11-11 (×2): qty 1

## 2023-11-11 MED ORDER — CLONAZEPAM 1 MG PO TABS: 1.0000 mg | ORAL_TABLET | Freq: Three times a day (TID) | ORAL | Status: DC

## 2023-11-11 MED ORDER — POLYETHYLENE GLYCOL 3350 17 G PO PACK
17.0000 g | PACK | Freq: Every day | ORAL | Status: DC
  Administered 2023-11-11: 17 g via ORAL
  Filled 2023-11-11: qty 1

## 2023-11-11 MED ORDER — CLEVIDIPINE BUTYRATE 0.5 MG/ML IV EMUL
0.0000 mg/h | INTRAVENOUS | Status: DC
  Administered 2023-11-11: 2 mg/h via INTRAVENOUS
  Administered 2023-11-11: 6 mg/h via INTRAVENOUS
  Administered 2023-11-12: 2 mg/h via INTRAVENOUS
  Filled 2023-11-11 (×3): qty 100

## 2023-11-11 MED ORDER — CLONIDINE HCL 0.1 MG PO TABS
0.2000 mg | ORAL_TABLET | Freq: Three times a day (TID) | ORAL | Status: DC
  Administered 2023-11-11 – 2023-11-12 (×3): 0.2 mg via ORAL
  Filled 2023-11-11 (×3): qty 2

## 2023-11-11 MED ORDER — MELATONIN 3 MG PO TABS
3.0000 mg | ORAL_TABLET | Freq: Every day | ORAL | Status: DC
  Administered 2023-11-11 – 2023-11-12 (×2): 3 mg via ORAL
  Filled 2023-11-11 (×2): qty 1

## 2023-11-11 MED ORDER — FLECAINIDE ACETATE 50 MG PO TABS: 50.0000 mg | ORAL_TABLET | Freq: Two times a day (BID) | ORAL | Status: DC

## 2023-11-11 MED ORDER — LABETALOL HCL 200 MG PO TABS
200.0000 mg | ORAL_TABLET | Freq: Three times a day (TID) | ORAL | Status: DC
  Administered 2023-11-11 – 2023-11-12 (×6): 200 mg via ORAL
  Filled 2023-11-11 (×7): qty 1

## 2023-11-11 MED ORDER — SIMETHICONE 80 MG PO CHEW
80.0000 mg | CHEWABLE_TABLET | Freq: Four times a day (QID) | ORAL | Status: DC | PRN
  Administered 2023-11-11 – 2023-11-20 (×8): 80 mg via ORAL
  Filled 2023-11-11 (×8): qty 1

## 2023-11-11 MED ORDER — CLONAZEPAM 1 MG PO TABS
1.0000 mg | ORAL_TABLET | Freq: Once | ORAL | Status: AC
  Administered 2023-11-11: 1 mg via ORAL
  Filled 2023-11-11: qty 1

## 2023-11-11 MED ORDER — BISACODYL 5 MG PO TBEC
10.0000 mg | DELAYED_RELEASE_TABLET | Freq: Every day | ORAL | Status: DC
  Administered 2023-11-11 – 2023-11-12 (×2): 10 mg via ORAL
  Filled 2023-11-11 (×2): qty 2

## 2023-11-11 MED ORDER — TRAZODONE HCL 50 MG PO TABS
50.0000 mg | ORAL_TABLET | Freq: Every evening | ORAL | Status: DC | PRN
  Administered 2023-11-11: 50 mg via ORAL
  Filled 2023-11-11 (×2): qty 1

## 2023-11-11 MED ORDER — BISACODYL 10 MG RE SUPP: 10.0000 mg | Freq: Every day | RECTAL | Status: DC

## 2023-11-11 MED ORDER — METOCLOPRAMIDE HCL 5 MG/ML IJ SOLN: 10.0000 mg | Freq: Three times a day (TID) | INTRAMUSCULAR | Status: DC

## 2023-11-11 MED ORDER — CALCIUM CARBONATE ANTACID 500 MG PO CHEW
400.0000 mg | CHEWABLE_TABLET | Freq: Three times a day (TID) | ORAL | Status: DC
  Administered 2023-11-11 – 2023-11-12 (×5): 400 mg via ORAL
  Filled 2023-11-11 (×5): qty 2

## 2023-11-11 MED ORDER — ENSURE PLUS HIGH PROTEIN PO LIQD
237.0000 mL | Freq: Two times a day (BID) | ORAL | Status: DC
  Administered 2023-11-11 – 2023-11-20 (×8): 237 mL via ORAL

## 2023-11-11 NOTE — Progress Notes (Addendum)
  Progress Note    11/11/2023 6:31 AM Hospital Day 2  Subjective:  pt states he feels terrible this morning.  He is not having any chest or back pain.  He does have some indigestion since eating.  RN reports he has had 1-2 I&O cath since admit.  His wife is concerned he has worsening kidney function and tremor that he has not had in the past.  Would like Dr. Tobie and Dr. Renda on board for his care.   Afebrile HR 60's 110's-120's systolic 92% 2LO2NC  Gtts:   Esmolol  Nicardipine   Vitals:   11/11/23 0545 11/11/23 0600  BP: 115/66 116/64  Pulse: 64 67  Resp: 13 16  Temp:  (!) 97.5 F (36.4 C)  SpO2: 92% 91%    Physical Exam: General:  no distress Lungs:  non labored Extremities:  palpable bilateral radial pulses; bilateral feet are warm and well perfused.   CBC    Component Value Date/Time   WBC 10.1 11/11/2023 0238   RBC 4.50 11/11/2023 0238   HGB 13.8 11/11/2023 0238   HCT 41.5 11/11/2023 0238   PLT 180 11/11/2023 0238   MCV 92.2 11/11/2023 0238   MCH 30.7 11/11/2023 0238   MCHC 33.3 11/11/2023 0238   RDW 13.4 11/11/2023 0238   LYMPHSABS 2.3 11/08/2023 2111   MONOABS 1.2 (H) 11/08/2023 2111   EOSABS 0.3 11/08/2023 2111   BASOSABS 0.1 11/08/2023 2111    BMET    Component Value Date/Time   NA 131 (L) 11/11/2023 0238   K 4.8 11/11/2023 0238   CL 105 11/11/2023 0238   CO2 15 (L) 11/11/2023 0238   GLUCOSE 103 (H) 11/11/2023 0238   BUN 35 (H) 11/11/2023 0238   CREATININE 2.58 (H) 11/11/2023 0238   CALCIUM 8.4 (L) 11/11/2023 0238   GFRNONAA 25 (L) 11/11/2023 0238   GFRAA 56 (L) 12/24/2013 1316    INR    Component Value Date/Time   INR 1.0 11/09/2023 0845     Intake/Output Summary (Last 24 hours) at 11/11/2023 0631 Last data filed at 11/11/2023 0600 Gross per 24 hour  Intake 4638.63 ml  Output 1150 ml  Net 3488.63 ml     Assessment/Plan:  78 y.o. male with acute aortic syndrome either PACU versus IMH evaluated with noncontrast studies.    Hospital Day 2  -pt currently asymptomatic without back or chest or abdominal pain.   -AKI on CKD-worsening today with creatinine 2.58.  most likely will need to hold on CTA.   -he has also had some urinary retention and required I&O cath a couple of times. Pt on Flomax -pt's wife would like Dr. Tobie with nephrology and Dr. Renda to see pt.  Will defer to primary team.   Lucie Apt, PA-C [facility] [phone] 11/11/2023 6:31 AM  VASCULAR STAFF ADDENDUM: I have independently interviewed and examined the patient. I agree with the above.  Holding on CTA until creatinine improves. At this time, back pain has significantly improved with anti-impulse control. Up out of bed as tolerated. Can transition to oral medications.   Fonda FORBES Rim MD [facility] Phone Number: [phone] 11/11/2023 1:24 PM

## 2023-11-11 NOTE — Consult Note (Signed)
 Reason for Consult: AKI on CKD stage IIIb Referring Physician: Claudene, MD  Jack [name] is an 78 y.o. male with a PMH significant for HTN, HLD, CAD, P. Atrial fibrillation, ASCVD s/p Right CEA, h/o atheroembolic kidney disease with AKI in 2011 following LHC, h/o CVA, nephrolithiasis, obstructive uropathy (marked right hydronephrosis due to congenital right UPJ obstruction), and CKD Stage IIIb-IV who presented to [facility] ED via EMS on 11/08/23 with acute onset of upper back pain that occurred at rest.  In the ED, Bp 186/114, HR 69, Temp 97.8, SpO2 100%, RR 16.   Labs were notable for WBC 8.5, Hgb 16.2, BUN 33, Cr 1.9.  Imaging studies without contrast were concerning for proximal descending thoracic aorta dissection and intramural hematoma or contained perforation.  He was started on esmolol  and nicardipine  drips and admitted to the ICU.  We were consulted due to the development of AKI/CKD stage IIIb.  The trend in Scr is seen below.  UOP had been stable but started to drop today.  Bp's have significantly improved with esmolol  and nicardipine  drips.   He denies any N/V/anorexia, dysgeusia, but does have significant acid indigestion today.  Trend in Creatinine: Creatinine, Ser  Date/Time Value Ref Range Status  11/11/2023 02:38 AM 2.58 (H) 0.61 - 1.24 mg/dL Final  93/77/7974 97:73 AM 2.37 (H) 0.61 - 1.24 mg/dL Final  93/79/7974 90:88 PM 1.90 (H) 0.61 - 1.24 mg/dL Final  95/97/7974 95:69 AM 1.99 (H) 0.61 - 1.24 mg/dL Final  95/98/7974 92:79 AM 1.98 (H) 0.61 - 1.24 mg/dL Final  91/76/7975 87:54 PM 2.04 (H) 0.61 - 1.24 mg/dL Final  91/93/7984 98:83 PM 1.45 (H) 0.50 - 1.35 mg/dL Final  96/84/7988 92:92 AM 3.82 (H) 0.4 - 1.5 mg/dL Final  98/84/7988 95:60 AM 2.05 (H) 0.4 - 1.5 mg/dL Final  98/85/7988 96:54 AM 2.18 (H) 0.4 - 1.5 mg/dL Final  98/86/7988 93:69 AM 1.99 (H) 0.4 - 1.5 mg/dL Final  98/87/7988 91:65 PM 2.0 (H) 0.4 - 1.5 mg/dL Final  87/69/7989 94:59 AM 1.37 0.4 - 1.5 mg/dL Final  87/71/7989 93:98  PM 1.17 0.4 - 1.5 mg/dL Final    PMH:   Past Medical History:  Diagnosis Date   Allergy    Arthritis    psoriatic arthritis    Asthma    Atheroembolic kidney disease (HCC)    Atheroembolism    CAD (coronary artery disease)    hx non obst   Cancer (HCC)    basal cell x3 , last one removed - 12/12/2013, R temple area of forehead   Carotid artery occlusion    Chronic kidney disease    renal stones - tx. /w lithotripsy   Complication of anesthesia    GERD (gastroesophageal reflux disease)    HLD (hyperlipidemia)    HTN (hypertension)    Hypoglycemia    Paroxysmal atrial fibrillation (HCC)    PONV (postoperative nausea and vomiting)    Pruritus    Psoriasis    Shortness of breath    after eating , if he gets active, he might have some SOB   Vitamin D deficiency     PSH:   Past Surgical History:  Procedure Laterality Date   AV FISTULA PLACEMENT  07-2009   Left Brachiocephalic AVF   CARDIAC CATHETERIZATION  2010   COLONOSCOPY  09/15/2012   Colonic polyp status post polypectomy. Mild sigmoid diverticulosis. Small internal hemorrhoids. Otherwise normal colonoscopy    ENDARTERECTOMY Right 08/20/2023   Procedure: ENDARTERECTOMY, CAROTID;  Surgeon: Lanis Chew  E, MD;  Location: [facility] OR;  Service: Vascular;  Laterality: Right;   LIGATION OF ARTERIOVENOUS  FISTULA Left 12/30/2013   Procedure: EXCISION OF LEFT ARM ARTERIOVENOUS  FISTULA;  Surgeon: Krystal JULIANNA Doing, MD;  Location: [facility] OR;  Service: Vascular;  Laterality: Left;   LITHOTRIPSY     PATCH ANGIOPLASTY Right 08/20/2023   Procedure: PATCH ANGIOPLASTY USING GEORGE BIOLOGIC PATCH;  Surgeon: Lanis Fonda BRAVO, MD;  Location: [facility] OR;  Service: Vascular;  Laterality: Right;   skin cancer removed     multiple basal cell    Allergies:  Allergies  Allergen Reactions   Roxicodone  [Oxycodone ] Nausea And Vomiting   Pravachol  [Pravastatin ] Other (See Comments)    Myalgias    Sulfa Antibiotics Itching   Zohydro Er [Hydrocodone Bitartrate  Er] Nausea Only   Eliquis  [Apixaban ] Itching and Rash   Hydrochlorothiazide Anxiety   Hytrin [Terazosin Hcl] Palpitations and Other (See Comments)    Heart races   Pradaxa  [Dabigatran  Etexilate Mesylate] Itching and Rash   Xarelto  [Rivaroxaban ] Itching and Rash    Medications:   Prior to Admission medications   Medication Sig Start Date End Date Taking? Authorizing Provider  acetaminophen  (TYLENOL ) 500 MG tablet Take 500 mg by mouth every 6 (six) hours as needed for mild pain (pain score 1-3), moderate pain (pain score 4-6) or headache.   Yes [provider]  amLODipine  (NORVASC ) 5 MG tablet Take 1 tablet (5 mg total) by mouth daily for blood pressure Patient taking differently: Take 5 mg by mouth every evening. 08/23/23  Yes   aspirin  EC 81 MG tablet Take 81 mg by mouth every evening.   Yes [provider]  betamethasone  dipropionate 0.05 % lotion Apply 1 application  topically 2 (two) times daily as needed (Psoriasis).   Yes [provider]  Cholecalciferol (VITAMIN D-3 PO) Take 1 capsule by mouth daily.   Yes [provider]  Coenzyme Q10 (COQ10 PO) Take 1 capsule by mouth every evening.   Yes [provider]  Evolocumab  (REPATHA  SURECLICK) 140 MG/ML SOAJ Inject 140 mg into the skin every 14 (fourteen) days. Patient taking differently: Inject 140 mg into the skin See admin instructions. Inject 1mL (140mg ) into the skin every other Saturday. 06/21/23  Yes   flecainide  (TAMBOCOR ) 50 MG tablet Take 1 (one)  Tablet by mouth two times daily 08/23/23  Yes   isosorbide  mononitrate (IMDUR ) 30 MG 24 hr tablet Take 1 tablet (30 mg total) by mouth daily. 01/17/23  Yes Verlin Lonni BIRCH, MD  labetalol  (NORMODYNE ) 200 MG tablet Take 100 mg by mouth 2 (two) times daily.   Yes [provider]  loratadine (CLARITIN) 10 MG tablet Take 10 mg by mouth daily.   Yes [provider]  Omega-3 Fatty Acids (FISH OIL PO) Take 1 capsule by mouth  daily.   Yes [provider]  Triamcinolone  Acetonide (KENALOG  IJ) Inject 1 Dose into the muscle every 3 (three) months.   Yes [provider]  triamcinolone  cream (KENALOG ) 0.1 % Apply topically 2 (two) times daily. Patient taking differently: Apply 1 Application topically 2 (two) times daily as needed (skin irritation, itching). 10/22/22  Yes   nitroGLYCERIN  (NITROSTAT ) 0.4 MG SL tablet Place 1 tablet (0.4 mg total) under the tongue every 5 (five) minutes as needed for chest pain. 01/17/23 08/16/23  Verlin Lonni BIRCH, MD    Inpatient medications:  amLODipine   10 mg Oral QPM   aspirin  EC  81 mg Oral QPM  bisacodyl  10 mg Oral Daily   Chlorhexidine  Gluconate Cloth  6 each Topical Daily   cloNIDine  0.2 mg Oral TID   feeding supplement  237 mL Oral BID BM   isosorbide  mononitrate  30 mg Oral Daily   labetalol   200 mg Oral TID   melatonin  3 mg Oral QHS   metoCLOPramide (REGLAN) injection  5 mg Intravenous Q8H   polyethylene glycol  17 g Oral Daily   tamsulosin  0.4 mg Oral Daily    Discontinued Meds:   Medications Discontinued During This Encounter  Medication Reason   amLODipine  (NORVASC ) 10 MG tablet Dose change   Cholecalciferol (VITAMIN D-3) 125 MCG (5000 UT) TABS Duplicate   Coenzyme Q10 (COQ-10) 100 MG capsule Duplicate   flecainide  (TAMBOCOR ) 100 MG tablet Dose change   labetalol  (NORMODYNE ) 100 MG tablet    Omega-3 Fatty Acids (FISH OIL) 1000 MG CAPS Duplicate   sodium chloride  (OCEAN) 0.65 % SOLN nasal spray    flecainide  (TAMBOCOR ) tablet 50 mg    amLODipine  (NORVASC ) tablet 5 mg    lactated ringers  infusion    labetalol  (NORMODYNE ) tablet 100 mg    metoCLOPramide (REGLAN) injection 10 mg    bisacodyl (DULCOLAX) suppository 10 mg    flecainide  (TAMBOCOR ) tablet 50 mg    clonazePAM (KLONOPIN) tablet 1 mg     Social History:  reports that he quit smoking about 53 years ago. His smoking use included cigarettes. He has never used smokeless tobacco.  He reports that he does not drink alcohol and does not use drugs.  Family History:   Family History  Problem Relation Age of Onset   Cancer Mother    Heart disease Father        Before age 74   Asthma Father    Heart disease Brother        After 63 years of age   Coronary artery disease Other        family hx   Colon cancer Neg Hx    Esophageal cancer Neg Hx    Liver cancer Neg Hx    Pancreatic cancer Neg Hx    Rectal cancer Neg Hx    Stomach cancer Neg Hx     Pertinent items are noted in HPI. Weight change:   Intake/Output Summary (Last 24 hours) at 11/11/2023 1322 Last data filed at 11/11/2023 1300 Gross per 24 hour  Intake 4125.88 ml  Output 1050 ml  Net 3075.88 ml   BP 123/68   Pulse 68   Temp (!) 97.5 F (36.4 C) (Oral)   Resp 16   Ht 5' 10 (1.778 m)   Wt 59.1 kg   SpO2 93%   BMI 18.69 kg/m  Vitals:   11/11/23 1230 11/11/23 1245 11/11/23 1300 11/11/23 1315  BP: 102/70 101/76 123/71 123/68  Pulse: 65 70 66 68  Resp: 20 17 14 16   Temp:      TempSrc:      SpO2: 92% 94% 95% 93%  Weight:      Height:         General appearance: alert, cooperative, and fatigued Head: Normocephalic, without obvious abnormality, atraumatic Eyes: negative findings: lids and lashes normal, conjunctivae and sclerae normal, and corneas clear Resp: clear to auscultation bilaterally Cardio: regular rate and rhythm, S1, S2 normal, no murmur, click, rub or gallop GI: distended, soft, non-tender; bowel sounds normal; no masses,  no organomegaly Extremities: extremities normal, atraumatic, no cyanosis or edema  Labs: Basic  Metabolic Panel: Recent Labs  Lab 11/08/23 2111 11/10/23 0226 11/11/23 0238 11/11/23 1101  NA 140 134* 131*  --   K 3.8 4.2 4.8  --   CL 103 108 105  --   CO2 25 21* 15*  --   GLUCOSE 118* 113* 103*  --   BUN 33* 31* 35*  --   CREATININE 1.90* 2.37* 2.58*  --   ALBUMIN 3.8  --   --  2.6*  CALCIUM 9.7 8.2* 8.4*  --   PHOS  --  3.2  --   --    Liver  Function Tests: Recent Labs  Lab 11/08/23 2111 11/11/23 1101  AST 25 14*  ALT 20 14  ALKPHOS 66 55  BILITOT 0.3 0.9  PROT 6.7 5.3*  ALBUMIN 3.8 2.6*   Recent Labs  Lab 11/08/23 2111  LIPASE 38   Recent Labs  Lab 11/11/23 1101  AMMONIA 25   CBC: Recent Labs  Lab 11/08/23 2111 11/10/23 0226 11/11/23 0238  WBC 8.5 10.1 10.1  NEUTROABS 4.5  --   --   HGB 16.2 14.2 13.8  HCT 47.3 41.5 41.5  MCV 90.4 89.4 92.2  PLT 251 217 180   PT/INR: @LABRCNTIP (inr:5) Cardiac Enzymes: )No results for input(s): CKTOTAL, CKMB, CKMBINDEX, TROPONINI in the last 168 hours. CBG: Recent Labs  Lab 11/09/23 1414 11/10/23 0854  GLUCAP 102* 125*    Iron Studies: No results for input(s): IRON, TIBC, TRANSFERRIN, FERRITIN in the last 168 hours.  Xrays/Other Studies: ECHOCARDIOGRAM LIMITED Result Date: 11/11/2023    ECHOCARDIOGRAM LIMITED REPORT   Patient Name:   KIAM BRANSFIELD [facility] Date of Exam: 11/11/2023 Medical Rec #:  [id number]     Height:       70.0 in Accession #:    [id number]    Weight:       130.3 lb Date of Birth:  August 18, 1945     BSA:          1.740 m Patient Age:    78 years      BP:           118/71 mmHg Patient Gender: M             HR:           67 bpm. Exam Location:  Inpatient Procedure: Cardiac Doppler, Color Doppler, Limited Echo and 2D Echo (Both            Spectral and Color Flow Doppler were utilized during procedure). Indications:    CHF- Acute Diastolic I50.31  History:        Patient has prior history of Echocardiogram examinations, most                 recent 07/04/2023. Type B Aortic Dissection, CAD; Risk                 Factors:Hypertension.  Sonographer:    Koleen Popper RDCS Referring Phys: [id number] TORIBIO BROCKS SMITH IMPRESSIONS  1. Left ventricular ejection fraction, by estimation, is 60 to 65%. The left ventricle has normal function. The left ventricle has no regional wall motion abnormalities. Left ventricular diastolic function could not be evaluated.  2. Right  ventricular systolic function is normal. The right ventricular size is normal.  3. The mitral valve is normal in structure.  4. The aortic valve is tricuspid. Aortic valve regurgitation is not visualized. No aortic stenosis is present.  5. The inferior vena cava is normal in size with <50% respiratory  variability, suggesting right atrial pressure of 8 mmHg. FINDINGS  Left Ventricle: Left ventricular ejection fraction, by estimation, is 60 to 65%. The left ventricle has normal function. The left ventricle has no regional wall motion abnormalities. The left ventricular internal cavity size was normal in size. There is  no left ventricular hypertrophy. Left ventricular diastolic function could not be evaluated. Right Ventricle: The right ventricular size is normal. No increase in right ventricular wall thickness. Right ventricular systolic function is normal. Left Atrium: Left atrial size was normal in size. Right Atrium: Right atrial size was normal in size. Mitral Valve: The mitral valve is normal in structure. Tricuspid Valve: The tricuspid valve is normal in structure. Aortic Valve: The aortic valve is tricuspid. Aortic valve regurgitation is not visualized. No aortic stenosis is present. Pulmonic Valve: The pulmonic valve was not well visualized. Aorta: The aortic root and ascending aorta are structurally normal, with no evidence of dilitation. Venous: The inferior vena cava is normal in size with less than 50% respiratory variability, suggesting right atrial pressure of 8 mmHg. Additional Comments: Spectral Doppler performed. Color Doppler performed.  LEFT VENTRICLE PLAX 2D LVIDd:         5.70 cm LVIDs:         3.60 cm LV PW:         1.10 cm LV IVS:        1.10 cm LVOT diam:     2.10 cm LVOT Area:     3.46 cm  IVC IVC diam: 2.20 cm LEFT ATRIUM         Index LA diam:    4.00 cm 2.30 cm/m   AORTA Ao Root diam: 3.90 cm Ao Asc diam:  3.70 cm MITRAL VALVE MV Area (PHT): 3.42 cm    SHUNTS MV Decel Time: 222 msec     Systemic Diam: 2.10 cm MV E velocity: 89.60 cm/s MV A velocity: 85.90 cm/s MV E/A ratio:  1.04 Morene Brownie Electronically signed by Morene Brownie Signature Date/Time: 11/11/2023/10:25:45 AM    Final      Assessment/Plan:   AKI/CKD stage IIIb - baseline Scr around 1.9-2.  He has a solitary functioning kidney on the left due to chronic UPJ obstruction on the right.  Bump in Scr is likely hemodynamically mediated with rapid drop in Bp due to aortic dissection.  Also possible contribution from urinary retention.  UOP has dropped over the last 24 hours.  Agree with foley catheter and hold off on lasix  for now.  His Scr may continue to climb over the next 24-48 hours.  Maintain MAP of 65 or greater.  Currently no uremic symptoms or urgent indication for dialysis at this time.  Hopefully his Scr will stabilize and he can proceed with CTA.  Otherwise, we may need to initiate dialysis if his numbers continue to worsen.    Avoid nephrotoxic medications including NSAIDs and iodinated intravenous contrast exposure unless the latter is absolutely indicated.   Preferred narcotic agents for pain control are hydromorphone , fentanyl , and methadone. Morphine  should not be used.  Avoid Baclofen and avoid oral sodium phosphate  and magnesium  citrate based laxatives / bowel preps.  Continue strict Input and Output monitoring. Will monitor the patient closely with you and intervene or adjust therapy as indicated by changes in clinical status/labs  Acute aortic syndrome - concerning for aortic dissection type B.  Vascular surgery is following.  Holding off on CTA for now given AKI.  Blood pressures markedly improved with esmolol  and  nicardipine . Hypertensive emergency  - improved with esmolol  and nicardipine .  Paroxysmal atrial fibrillation - long term flecanide, not on anticoagulation. Urinary retention - agree with foley catheter Hyponatremia - mild and normal Sosm.  Continue to follow.  Resting tremors - apparently  new during this admission.  Do not think AKI is related to this.  Continue to follow.   Fairy A Haik Mahoney 11/11/2023, 1:22 PM

## 2023-11-11 NOTE — Telephone Encounter (Signed)
 Patient Advocate Encounter   The patient was approved for a [employer] grant that will help cover the cost of REPATHA  Total amount awarded, $2,500.  Effective: 10/12/23 - 10/10/24   APW:[id number] ERW:[id number] Hmnle:[id number] PI:[id number]   Pharmacy provided with approval and processing information.   Ileana Lehmann, CPhT  Pharmacy Patient Advocate Specialist  Direct Number: [phone] Fax: [phone]

## 2023-11-11 NOTE — Progress Notes (Signed)
  Echocardiogram 2D Echocardiogram has been performed.  Koleen KANDICE Popper, RDCS 11/11/2023, 10:00 AM

## 2023-11-11 NOTE — Progress Notes (Addendum)
 NAME:  Jack [name], MRN:  [id number], DOB:  April 25, 1946, LOS: 2 ADMISSION DATE:  11/08/2023, CONSULTATION DATE:  11/09/2023 REFERRING MD:  Dr. Albertina - EDP , CHIEF COMPLAINT:   Back pain   History of Present Illness:  Jack [name] is a 78 y.o. male with a PMH significant for CAD, CKD, HLD, HTN, PAF, and asthma who presented to the ED with complaints of sudden onset upper back pain. On ED arrival patient was seen hypertensive with BP 180/120, all other vital signs within normal.  Lab work significant for glucose 118, BUN 33, creatinine 190 with GFR 36, all other vital signs within normal limits.  Given concern for possible dissection MR angio chest was obtained and concerning for proximal descending thoracic aorta dissection.  Given AKI CT chest without contrast was also obtained and again concerning for intramural hematoma or contained perforation.  Patient was started on esmolol  and nicardipine  drips with PCCM and vascular consults.  Pertinent  Medical History  CAD, CKD, HLD, HTN, PAF, and asthma   Significant Hospital Events: Including procedures, antibiotic start and stop dates in addition to other pertinent events   6/21 presented with upper back pain imaging concerning for aortic dissection admitted to ICU for esmolol  and nicardipine  drips 6/23 esmolol  changed to labetalol , esmolol  changed to cleviprex, labetalol  PO dose increased  Interim History / Subjective:  Remains on Cardene  + Esmolol . +4.5L since admit. SCr bumped from 1.9 yesterday to 2.6 today. Family requesting nephrology to be consulted (sees Dr. Tobie as outpatient). Has not had bowel movement yet.  Objective    Blood pressure 120/67, pulse 67, temperature (!) 97.5 F (36.4 C), temperature source Oral, resp. rate 17, height 5' 10 (1.778 m), weight 59.1 kg, SpO2 92%.        Intake/Output Summary (Last 24 hours) at 11/11/2023 9076 Last data filed at 11/11/2023 0900 Gross per 24 hour  Intake 3901.37 ml  Output 1100 ml   Net 2801.37 ml   Filed Weights   11/08/23 2103 11/11/23 0500  Weight: 66.7 kg 59.1 kg    Examination: General: elderly man lying in bed in NAD, wife at bedside HEENT: [place]/AT, eyes anicteric Neuro: awake, alert, moving all extremities. Able to sit up unassisted CV: RRR, no M/R/G PULM: Normal effort, CTAB GI: soft, NT Extremities: no peripheral edema, Skin: warm, dry, no rashes   Assessment and Plan   Acute aortic dissection, type B Hypertensive emergency -impulse control-- goal HR <80, SBP<120. Change Esmolol  to Labetalol  and Cardene  to Cleviprex to limit volume  -Continue PTA Imdur , labetalol  (increase to 200mg  TID), amlodipine  -needs repeat CTA once renal function improves; reassess tomorrow -pain control  History of CAD Essential hypertension  Hyperlipidemia  Paroxysmal atrial fibrillation  - Unable to tolerate Eliquis , Pradaxa , or Xarelto  as anticoagulation due to drug rash.  Long-term flecainide  for rate control. -con't PTA amlodipine , Imdur , labetalol  (increase dose), ASA. -hold PTA flecainide  -intolerant to statins due to myalgias  AKI on CKD, single kidney -Per chart review patient had cardiac cath in 2010 after which she has suffered with mild renal insufficiency due to suspected atheroemboli in contrast induced nephropathy.  Patient had a AV fistula placed in 2011 and later removed in 2015 as he never progressed to ESRD Urinary retention- may be due to side effect of antiemetrics -continue Flomax -place foley -Renal US  -Family requesting nephrology to be notified and involved in pt's care (sees Dr. Tobie outpatient) -need to wait on CTA until renal function is optimized  Oliguria. -Foley as above -Renal US  on left as above -Hold lasix  for now, get Echo and BNP then re-evaluate. Ultimately will likely defer to nephro  Hyponatremia -monitor, avoid hypotonic fluids  Abdominal distention -KUB -Reglan -Bowel regimen -Mobilize  Resting tremor of UE's -  unclear etiology, new per wife this admit -Get LFT's, ammonia  Generalized deconditioning - hospitalized in ICU since [facility] 6/20 - PT/OT to prevent muscular deconditioning - Ensure  Wife and patient updated at bedside today.  Best Practice (right click and Reselect all SmartList Selections daily)   Diet/type: Regular consistency (see orders) DVT prophylaxis SCD Pressure ulcer(s): N/A GI prophylaxis: PPI Lines: N/A Foley:  N/A Code Status:  full code Last date of multidisciplinary goals of care discussion: Continue to update patient and family daily  CC time: 30 min.   Sammi Gore, PA - [facility] For pager details, please see AMION or use Epic chat  After 1900, please call [facility] for cross coverage needs 11/11/2023, 9:52 AM

## 2023-11-11 NOTE — Telephone Encounter (Signed)
 Enrolled in grant, info added to [facility].

## 2023-11-11 NOTE — Evaluation (Signed)
 Physical Therapy Evaluation Patient Details Name: Jack [name] MRN: [id number] DOB: January 06, 1946 Today's Date: 11/11/2023  History of Present Illness  56M who was admitted for proximal descending thoracic aortic dissection.   PMH: CAD, HTN, CKD, asthma, and PAF  Clinical Impression  Pt admitted with above. PTA pt indep, driving, going to outpt PT for LE strengthening, and living with spouse. Pt now presenting with resting tremors, generalized weakness, increased falls risk, soft BP, and burning chest pain limiting OOB mobility at this time. At this time recommend inpatient rehab program > 3 hours a day to address above deficits to allow for safe transition back home with spouse. Acute PT to cont to follow to progress mobility and re-assess d/c recommendations.        If plan is discharge home, recommend the following: A lot of help with walking and/or transfers;A lot of help with bathing/dressing/bathroom;Assist for transportation;Help with stairs or ramp for entrance   Can travel by private vehicle        Equipment Recommendations  (TBD at next venue)  Recommendations for Other Services  Rehab consult    Functional Status Assessment Patient has had a recent decline in their functional status and demonstrates the ability to make significant improvements in function in a reasonable and predictable amount of time.     Precautions / Restrictions Precautions Precautions: Fall Precaution/Restrictions Comments: watch tremors, BP Restrictions Weight Bearing Restrictions Per Provider Order: No      Mobility  Bed Mobility Overal bed mobility: Needs Assistance Bed Mobility: Supine to Sit, Sit to Sidelying     Supine to sit: Min assist   Sit to sidelying: Mod assist General bed mobility comments: HOB elevated, directional verbal cues to sequence transfer to EOB, increased time, minA for LE management back into bed    Transfers Overall transfer level: Needs assistance Equipment  used: 1 person hand held assist Transfers: Sit to/from Stand Sit to Stand: Min assist, Mod assist           General transfer comment: bed elevated, modA to power up, increased tremors, face to face transfer    Ambulation/Gait Ambulation/Gait assistance: Mod assist Gait Distance (Feet):  (limited to side steps to [facility]) Assistive device: 1 person hand held assist (face to face transfer) Gait Pattern/deviations: Step-to pattern       General Gait Details: max directional verbal and tactile cues to side step to [facility]  Stairs            Wheelchair Mobility     Tilt Bed    Modified Rankin (Stroke Patients Only)       Balance Overall balance assessment: Needs assistance Sitting-balance support: Feet supported, Bilateral upper extremity supported Sitting balance-Leahy Scale: Fair     Standing balance support: Reliant on assistive device for balance, Bilateral upper extremity supported, During functional activity Standing balance-Leahy Scale: Poor Standing balance comment: reliant on external support                             Pertinent Vitals/Pain Pain Assessment Pain Assessment: Faces Faces Pain Scale: Hurts little more Pain Location: chest, reports burning with belching Pain Descriptors / Indicators: Burning Pain Intervention(s): Monitored during session (RN gave pt tums prior to PT session)    Home Living Family/patient expects to be discharged to:: Private residence Living Arrangements: Spouse/significant other Available Help at Discharge: Family;Available 24 hours/day Type of Home: House Home Access: Stairs to enter Entrance Stairs-Rails: None  Entrance Stairs-Number of Steps: 2   Home Layout: One level Home Equipment: None      Prior Function Prior Level of Function : Independent/Modified Independent             Mobility Comments: no AD, drove, grocery shopped, was going to outpt PT for LE strengthening as pt was having difficulty  getting up from a chair ADLs Comments: indep     Extremity/Trunk Assessment   Upper Extremity Assessment Upper Extremity Assessment: Generalized weakness (noted resting tremors)    Lower Extremity Assessment Lower Extremity Assessment: Generalized weakness    Cervical / Trunk Assessment Cervical / Trunk Assessment: Other exceptions (distended, tight abdomen)  Communication   Communication Communication: Impaired Factors Affecting Communication: Hearing impaired    Cognition Arousal: Alert (but then would become sleepy) Behavior During Therapy: Flat affect   PT - Cognitive impairments: Memory, Attention, Sequencing                       PT - Cognition Comments: pt initially alert and responsive however towards end of session pt more delayed in response and sleepy Following commands: Impaired Following commands impaired: Follows one step commands with increased time     Cueing Cueing Techniques: Verbal cues     General Comments General comments (skin integrity, edema, etc.): pt with report of lightheadedness upon sitting EOB, BP dec to 111/63 from 121/70.    Exercises     Assessment/Plan    PT Assessment Patient needs continued PT services  PT Problem List Decreased strength;Decreased activity tolerance;Decreased range of motion;Decreased balance;Decreased mobility;Decreased cognition       PT Treatment Interventions DME instruction;Gait training;Stair training;Functional mobility training;Therapeutic exercise;Therapeutic activities;Balance training    PT Goals (Current goals can be found in the Care Plan section)  Acute Rehab PT Goals Patient Stated Goal: feel better PT Goal Formulation: With patient/family Time For Goal Achievement: 11/25/23 Potential to Achieve Goals: Good    Frequency Min 2X/week     Co-evaluation               AM-PAC PT 6 Clicks Mobility  Outcome Measure Help needed turning from your back to your side while in a  flat bed without using bedrails?: A Little Help needed moving from lying on your back to sitting on the side of a flat bed without using bedrails?: A Little Help needed moving to and from a bed to a chair (including a wheelchair)?: A Little Help needed standing up from a chair using your arms (e.g., wheelchair or bedside chair)?: A Lot Help needed to walk in hospital room?: A Lot Help needed climbing 3-5 steps with a railing? : A Lot 6 Click Score: 15    End of Session   Activity Tolerance: Patient limited by fatigue;Patient limited by lethargy Patient left: in bed;with call bell/phone within reach;with family/visitor present Nurse Communication: Mobility status (drop in BP) PT Visit Diagnosis: Unsteadiness on feet (R26.81);Muscle weakness (generalized) (M62.81);Difficulty in walking, not elsewhere classified (R26.2)    Time: 8392-8355 PT Time Calculation (min) (ACUTE ONLY): 37 min   Charges:   PT Evaluation $PT Eval Moderate Complexity: 1 Mod PT Treatments $Therapeutic Activity: 8-22 mins PT General Charges $$ ACUTE PT VISIT: 1 Visit         Norene Ames, PT, DPT Acute Rehabilitation Services Secure chat preferred Office #: [phone]   Norene CHRISTELLA Ames 11/11/2023, 5:08 PM

## 2023-11-11 NOTE — Progress Notes (Signed)
 eLink Physician-Brief Progress Note Patient Name: TAIKI BUCKWALTER DOB: [birth year] MRN: [id number]   Date of Service  11/11/2023  HPI/Events of Note  Asking for I/O cath for re tension  eICU Interventions  Ordered, avoid trauma, hematuria.      Intervention Category Minor Interventions: Other:  Jodelle ONEIDA Hutching 11/11/2023, 2:28 AM

## 2023-11-12 ENCOUNTER — Inpatient Hospital Stay (HOSPITAL_COMMUNITY)

## 2023-11-12 DIAGNOSIS — E785 Hyperlipidemia, unspecified: Secondary | ICD-10-CM | POA: Diagnosis not present

## 2023-11-12 DIAGNOSIS — I161 Hypertensive emergency: Secondary | ICD-10-CM | POA: Diagnosis not present

## 2023-11-12 DIAGNOSIS — N179 Acute kidney failure, unspecified: Secondary | ICD-10-CM | POA: Diagnosis not present

## 2023-11-12 DIAGNOSIS — I71012 Dissection of descending thoracic aorta: Secondary | ICD-10-CM | POA: Diagnosis not present

## 2023-11-12 LAB — BASIC METABOLIC PANEL WITH GFR
Anion gap: 10 (ref 5–15)
BUN: 43 mg/dL — ABNORMAL HIGH (ref 8–23)
CO2: 14 mmol/L — ABNORMAL LOW (ref 22–32)
Calcium: 8.5 mg/dL — ABNORMAL LOW (ref 8.9–10.3)
Chloride: 103 mmol/L (ref 98–111)
Creatinine, Ser: 2.86 mg/dL — ABNORMAL HIGH (ref 0.61–1.24)
GFR, Estimated: 22 mL/min — ABNORMAL LOW (ref >60)
Glucose, Bld: 110 mg/dL — ABNORMAL HIGH (ref 70–99)
Potassium: 5.4 mmol/L — ABNORMAL HIGH (ref 3.5–5.1)
Sodium: 127 mmol/L — ABNORMAL LOW (ref 135–145)

## 2023-11-12 LAB — CBC
HCT: 36.2 % — ABNORMAL LOW (ref 39.0–52.0)
Hemoglobin: 12.6 g/dL — ABNORMAL LOW (ref 13.0–17.0)
MCH: 30.7 pg (ref 26.0–34.0)
MCHC: 34.8 g/dL (ref 30.0–36.0)
MCV: 88.1 fL (ref 80.0–100.0)
Platelets: 184 10*3/uL (ref 150–400)
RBC: 4.11 MIL/uL — ABNORMAL LOW (ref 4.22–5.81)
RDW: 13 % (ref 11.5–15.5)
WBC: 10.9 10*3/uL — ABNORMAL HIGH (ref 4.0–10.5)
nRBC: 0 % (ref 0.0–0.2)

## 2023-11-12 LAB — MAGNESIUM: Magnesium: 1.9 mg/dL (ref 1.7–2.4)

## 2023-11-12 LAB — TROPONIN I (HIGH SENSITIVITY): Troponin I (High Sensitivity): 29 ng/L — ABNORMAL HIGH (ref <18)

## 2023-11-12 LAB — GLUCOSE, CAPILLARY: Glucose-Capillary: 141 mg/dL — ABNORMAL HIGH (ref 70–99)

## 2023-11-12 LAB — PHOSPHORUS: Phosphorus: 4.8 mg/dL — ABNORMAL HIGH (ref 2.5–4.6)

## 2023-11-12 MED ORDER — SMOG ENEMA
960.0000 mL | Freq: Once | RECTAL | Status: AC
  Administered 2023-11-12: 960 mL via RECTAL
  Filled 2023-11-12: qty 960

## 2023-11-12 MED ORDER — SODIUM CHLORIDE 0.9 % IV SOLN
12.5000 mg | Freq: Four times a day (QID) | INTRAVENOUS | Status: DC | PRN
  Administered 2023-11-12 – 2023-11-18 (×4): 12.5 mg via INTRAVENOUS
  Filled 2023-11-12: qty 12.5
  Filled 2023-11-12: qty 0.5
  Filled 2023-11-12: qty 12.5
  Filled 2023-11-12: qty 0.5

## 2023-11-12 MED ORDER — SODIUM ZIRCONIUM CYCLOSILICATE 10 G PO PACK
10.0000 g | PACK | Freq: Every day | ORAL | Status: DC
  Administered 2023-11-12: 10 g via ORAL
  Filled 2023-11-12: qty 1

## 2023-11-12 MED ORDER — FUROSEMIDE 10 MG/ML IJ SOLN
80.0000 mg | Freq: Two times a day (BID) | INTRAMUSCULAR | Status: DC
  Administered 2023-11-12 (×2): 80 mg via INTRAVENOUS
  Filled 2023-11-12 (×2): qty 8

## 2023-11-12 MED ORDER — SODIUM BICARBONATE 650 MG PO TABS
650.0000 mg | ORAL_TABLET | Freq: Three times a day (TID) | ORAL | Status: DC
  Administered 2023-11-12 (×3): 650 mg via ORAL
  Filled 2023-11-12 (×3): qty 1

## 2023-11-12 MED ORDER — CLONIDINE HCL 0.1 MG PO TABS
0.3000 mg | ORAL_TABLET | Freq: Three times a day (TID) | ORAL | Status: DC
  Administered 2023-11-12 (×2): 0.3 mg via ORAL
  Filled 2023-11-12 (×2): qty 3

## 2023-11-12 MED ORDER — CLONIDINE HCL 0.1 MG PO TABS
0.1000 mg | ORAL_TABLET | Freq: Once | ORAL | Status: AC
  Administered 2023-11-12: 0.1 mg via ORAL
  Filled 2023-11-12: qty 1

## 2023-11-12 MED ORDER — DIAZEPAM 5 MG/ML IJ SOLN: 2.5000 mg | INTRAMUSCULAR | Status: DC | PRN

## 2023-11-12 MED ORDER — ISOSORBIDE MONONITRATE ER 30 MG PO TB24
60.0000 mg | ORAL_TABLET | Freq: Every day | ORAL | Status: DC
  Administered 2023-11-12: 60 mg via ORAL
  Filled 2023-11-12: qty 2

## 2023-11-12 MED ORDER — HYDRALAZINE HCL 20 MG/ML IJ SOLN
20.0000 mg | INTRAMUSCULAR | Status: DC | PRN
  Administered 2023-11-12: 20 mg via INTRAVENOUS
  Filled 2023-11-12: qty 1

## 2023-11-12 MED ORDER — FENTANYL CITRATE PF 50 MCG/ML IJ SOSY
50.0000 ug | PREFILLED_SYRINGE | INTRAMUSCULAR | Status: DC | PRN
  Administered 2023-11-13 – 2023-11-14 (×2): 50 ug via INTRAVENOUS
  Filled 2023-11-12 (×3): qty 1

## 2023-11-12 NOTE — TOC Initial Note (Addendum)
 Transition of Care [facility]) - Initial/Assessment Note    Patient Details  Name: Jack [name] MRN: [id number] Date of Birth: 05-11-1946  Transition of Care [facility]) CM/SW Contact:    Arlana JINNY Nicholaus ISRA[place] Phone Number: [phone] 11/12/2023, 3:20 PM  Clinical Narrative:  CSW met with patient and wife at bedside. Patient was sleep, CSW spoke with wife. Patients wife stated that he has no history of HH services. Patients wife stated that he does not use any equipment. Patients wife stated that they have a scale. Patients wife stated that the patient stated that he was driving prior to this hospitalization. Patient wife stated that he has a PCP that they see every 3 months. CSW explained that a hospital follow up appointment is typically scheduled closer towards dc.   CIR is following.   TOC will continue following.                  Expected Discharge Plan: Home/Self Care Barriers to Discharge: Continued Medical Work up   Patient Goals and CMS Choice            Expected Discharge Plan and Services       Living arrangements for the past 2 months: Single Family Home                                      Prior Living Arrangements/Services Living arrangements for the past 2 months: Single Family Home Lives with:: Spouse Patient language and need for interpreter reviewed:: Yes Do you feel safe going back to the place where you live?: Yes      Need for Family Participation in Patient Care: No (Comment) Care giver support system in place?: Yes (comment)   Criminal Activity/Legal Involvement Pertinent to Current Situation/Hospitalization: No - Comment as needed  Activities of Daily Living   ADL Screening (condition at time of admission) Independently performs ADLs?: Yes (appropriate for developmental age) Is the patient deaf or have difficulty hearing?: No Does the patient have difficulty seeing, even when wearing glasses/contacts?: No Does the patient have difficulty  concentrating, remembering, or making decisions?: No  Permission Sought/Granted                  Emotional Assessment Appearance:: Appears stated age Attitude/Demeanor/Rapport: Engaged Affect (typically observed): Appropriate Orientation: : Oriented to Self, Oriented to Place, Oriented to  Time, Oriented to Situation Alcohol / Substance Use: Not Applicable Psych Involvement: No (comment)  Admission diagnosis:  Aortic dissection (HCC) [I71.00] Aortic anomaly [Q25.40] Patient Active Problem List   Diagnosis Date Noted   Aortic dissection (HCC) 11/09/2023   Carotid artery stenosis 08/20/2023   Soreness-type pain-Left arm  01/01/2014   Swelling of limb-Left arm  01/01/2014   Visit for wound check 01/01/2014   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, implant, and graft 02/06/2011   PAF (paroxysmal atrial fibrillation) (HCC) 01/31/2011   GERD 09/16/2009   Disorder resulting from impaired renal function 07/15/2009   HYPERTENSION, BENIGN 06/28/2009   CAD, NATIVE VESSEL 06/28/2009   ATHEROEMBOLISM 06/28/2009   INTERMITTENT CLAUDICATION, BILATERAL 06/27/2009   PCP:  Erick Greig LABOR, NP Pharmacy:   [facility] [address] [place] [address] Phone: [phone] Fax: [phone]     Social Drivers of Health (SDOH) Social History: SDOH Screenings   Food Insecurity: No Food Insecurity (11/10/2023)  Housing: Low Risk  (11/10/2023)  Transportation Needs: No Transportation Needs (11/10/2023)  Utilities: Not At Risk (11/10/2023)  Social Connections: Unknown (11/11/2023)  Tobacco Use: Medium Risk (11/08/2023)   SDOH Interventions:     Readmission Risk Interventions     No data to display

## 2023-11-12 NOTE — Progress Notes (Addendum)
  Progress Note    11/12/2023 6:50 AM Hospital Day 3  Subjective:  says chest and back pain have resolved.  He did have some left upper arm pain overnight and was given pain medication, which helped.  PT got pt to side of bed yesterday.  Afebrile HR 60's-70's NSR 100's-140's systolic 94%   Vitals:   11/12/23 0545 11/12/23 0615  BP: 122/75 123/72  Pulse: 69 65  Resp: 18 13  Temp:    SpO2: 93% 94%    Physical Exam: General:  no distress Lungs:  non labored Extremities:  palpable bilateral radial and PT pulses   CBC    Component Value Date/Time   WBC 10.9 (H) 11/12/2023 0311   RBC 4.11 (L) 11/12/2023 0311   HGB 12.6 (L) 11/12/2023 0311   HCT 36.2 (L) 11/12/2023 0311   PLT 184 11/12/2023 0311   MCV 88.1 11/12/2023 0311   MCH 30.7 11/12/2023 0311   MCHC 34.8 11/12/2023 0311   RDW 13.0 11/12/2023 0311   LYMPHSABS 2.3 11/08/2023 2111   MONOABS 1.2 (H) 11/08/2023 2111   EOSABS 0.3 11/08/2023 2111   BASOSABS 0.1 11/08/2023 2111    BMET    Component Value Date/Time   NA 127 (L) 11/12/2023 0311   K 5.4 (H) 11/12/2023 0311   CL 103 11/12/2023 0311   CO2 14 (L) 11/12/2023 0311   GLUCOSE 110 (H) 11/12/2023 0311   BUN 43 (H) 11/12/2023 0311   CREATININE 2.86 (H) 11/12/2023 0311   CALCIUM 8.5 (L) 11/12/2023 0311   GFRNONAA 22 (L) 11/12/2023 0311   GFRAA 56 (L) 12/24/2013 1316    INR    Component Value Date/Time   INR 1.0 11/09/2023 0845     Intake/Output Summary (Last 24 hours) at 11/12/2023 0650 Last data filed at 11/12/2023 0615 Gross per 24 hour  Intake 1858.89 ml  Output 800 ml  Net 1058.89 ml     Assessment/Plan:  78 y.o. male  with acute aortic syndrome either PACU versus IMH evaluated with noncontrast studies.   Hospital Day 3  -fortunately, chest and back pain have resolved.  He did have some left arm pain overnight that resolved with pain medication.   -AKI continues to worsen and trending upward today at 2.86.  Potassium 5.4 today.  Continue to  hold CTA for now.  Nephrology following.  Foley catheter placed yesterday with 800cc/24hr.  -ok to mobilize as able from vascular standpoint.   Lucie Apt, PA-C [facility] [phone] 11/12/2023 6:50 AM   VASCULAR STAFF ADDENDUM: I have independently interviewed and examined the patient. I agree with the above.  No back pain, chest pain, abdominal pain Creatinine continues to rise.  Continue to follow, with no plans to scan at this time until creatinine improves. Patient aware should back pain return while medically optimized, we will discuss operative management rather than rescan due to his renal disease.  Fonda FORBES Rim MD [facility] Phone Number: [phone] 11/12/2023 9:56 AM

## 2023-11-12 NOTE — Progress Notes (Signed)
 NAME:  Jack [name], MRN:  [id number], DOB:  05-Nov-1945, LOS: 3 ADMISSION DATE:  11/08/2023, CONSULTATION DATE:  11/09/2023 REFERRING MD:  Dr. Albertina - EDP , CHIEF COMPLAINT:   Back pain   History of Present Illness:  Jack [name] is a 78 y.o. male with a PMH significant for CAD, CKD, HLD, HTN, PAF, and asthma who presented to the ED with complaints of sudden onset upper back pain. On ED arrival patient was seen hypertensive with BP 180/120, all other vital signs within normal.  Lab work significant for glucose 118, BUN 33, creatinine 190 with GFR 36, all other vital signs within normal limits.  Given concern for possible dissection MR angio chest was obtained and concerning for proximal descending thoracic aorta dissection.  Given AKI CT chest without contrast was also obtained and again concerning for intramural hematoma or contained perforation.  Patient was started on esmolol  and nicardipine  drips with PCCM and vascular consults.  Pertinent  Medical History  CAD, CKD, HLD, HTN, PAF, and asthma   Significant Hospital Events: Including procedures, antibiotic start and stop dates in addition to other pertinent events   6/21 presented with upper back pain imaging concerning for aortic dissection admitted to ICU for esmolol  and nicardipine  drips 6/23 esmolol  changed to labetalol , esmolol  changed to cleviprex, labetalol  PO dose increased  Interim History / Subjective:  Remains on Cardene  + Esmolol . +4.5L since admit. SCr bumped from 1.9 yesterday to 2.6 today. Family requesting nephrology to be consulted (sees Dr. Tobie as outpatient). Has not had bowel movement yet.  Objective    Blood pressure 126/80, pulse 66, temperature 97.7 F (36.5 C), temperature source Oral, resp. rate 12, height 5' 10 (1.778 m), weight 59.1 kg, SpO2 97%.        Intake/Output Summary (Last 24 hours) at 11/12/2023 0844 Last data filed at 11/12/2023 0615 Gross per 24 hour  Intake 1439.76 ml  Output 800 ml  Net  639.76 ml   Filed Weights   11/08/23 2103 11/11/23 0500  Weight: 66.7 kg 59.1 kg    Examination: General: elderly man lying in bed in NAD, wife at bedside HEENT: /AT, eyes anicteric Neuro: awake, alert, moving all extremities. Able to sit up unassisted CV: RRR, no M/R/G PULM: Normal effort, CTAB GI: soft, NT Extremities: no peripheral edema, Skin: warm, dry, no rashes   Assessment and Plan   Acute aortic dissection, type B Hypertensive emergency -Continue Clevidipine for impulse control-- goal HR <80, SBP<120. - Clonidine added 6/23 - continue. -Continue PTA Imdur  (increase from 30mg  to 60mg  daily), labetalol  (increase to 200mg  TID), amlodipine  -needs repeat CTA once renal function improves -pain control  L arm pain - localized to biceps area, non-radiating. Doubt cardiac/ischemia related, likely MSK - Check troponin for completeness - Mobilize and active ROM as he is up, should help  History of CAD Essential hypertension  Hyperlipidemia  Paroxysmal atrial fibrillation  - Unable to tolerate Eliquis , Pradaxa , or Xarelto  as anticoagulation due to drug rash.  Long-term flecainide  for rate control. -con't PTA amlodipine , Imdur  (at increased dose), labetalol  (at increased dose), ASA. -hold PTA flecainide  -intolerant to statins due to myalgias  AKI on CKD, single kidney -Per chart review patient had cardiac cath in 2010 after which she has suffered with mild renal insufficiency due to suspected atheroemboli in contrast induced nephropathy.  Patient had a AV fistula placed in 2011 and later removed in 2015 as he never progressed to ESRD. Renal US  6/23 without new obstruction.  Urinary retention Hyperkalemia. Hyponatremia -Nephrology following, appreciate the assistance. -continue Flomax -continue foley -Lasix , Lokelma per nephrology -need to wait on CTA until renal function is optimized  NAGMA - Start HCO3 tabs. - Follow BMP  Abdominal distention and constipation - no  BM this admit -Continue Reglan -Bowel regimen -Tap water enema x 1 -Mobilize - PT/OT  Resting tremor of UE's - unclear etiology, new per wife this admit. LFT's and ammonia OK. -Supportive care  Generalized deconditioning - hospitalized in ICU since P{M 6/20 - PT/OT to prevent muscular deconditioning - Ensure  Wife and patient updated at bedside today.  Best Practice (right click and Reselect all SmartList Selections daily)   Diet/type: Regular consistency (see orders) DVT prophylaxis SCD Pressure ulcer(s): N/A GI prophylaxis: PPI Lines: N/A Foley:  N/A Code Status:  full code Last date of multidisciplinary goals of care discussion: Continue to update patient and family daily  CC time: 30 min.   Sammi Gore, PA - C [place] [facility] For pager details, please see AMION or use Epic chat  After 1900, please call [facility] for cross coverage needs 11/12/2023, 8:44 AM

## 2023-11-12 NOTE — Progress Notes (Signed)
Inpatient Rehab Admissions Coordinator:   Per therapy recommendations patient was screened for CIR candidacy by Megan Salon, MS, CCC-SLP. At this time, Pt. Appears to be a a potential candidate for CIR. I will place   order for rehab consult per protocol for full assessment. Please contact me any with questions.  Megan Salon, MS, CCC-SLP Rehab Admissions Coordinator  [phone] (celll) [phone] (office)

## 2023-11-12 NOTE — Progress Notes (Signed)
 eLink Physician-Brief Progress Note Patient Name: Jack [name] DOB: 1945-06-09 MRN: [id number]   Date of Service  11/12/2023  HPI/Events of Note  Patient with ileus associated with intractable nausea and vomiting, despite PRN Zofran .  eICU Interventions  NG tube ordered to LIS.        Nina Mondor U [name] 11/12/2023, 11:38 PM

## 2023-11-12 NOTE — Evaluation (Signed)
 Occupational Therapy Evaluation Patient Details Name: Jack [name] MRN: [id number] DOB: 1945-07-24 Today's Date: 11/12/2023   History of Present Illness   54M who was admitted for proximal descending thoracic aortic dissection.   PMH: CAD, HTN, CKD, asthma, and PAF     Clinical Impressions PT admitted with aortic dissection with new onset of BUE tremor. Pt currently with functional limitiations due to the deficits listed below (see OT problem list). Pt at baseline indep driving and community based errands. Pt at this time with cognitive changes and requires wife to (A) with self feeding. Tremors are new and affecting all adls.  Pt will benefit from skilled OT to increase their independence and safety with adls and balance to allow discharge Patient will benefit from intensive inpatient follow-up therapy, >3 hours/day .      If plan is discharge home, recommend the following:   Two people to help with walking and/or transfers;Two people to help with bathing/dressing/bathroom     Functional Status Assessment   Patient has had a recent decline in their functional status and demonstrates the ability to make significant improvements in function in a reasonable and predictable amount of time.     Equipment Recommendations   BSC/3in1;Other (comment) (possible need for transport chair)     Recommendations for Other Services   Rehab consult     Precautions/Restrictions   Precautions Precautions: Fall Recall of Precautions/Restrictions: Impaired Precaution/Restrictions Comments: watch tremors, BP     Mobility Bed Mobility Overal bed mobility: Needs Assistance Bed Mobility: Supine to Sit, Sit to Supine     Supine to sit: Mod assist Sit to supine: Min assist   General bed mobility comments: hob elevated and (A) to pivot to EOB with pad and R LE (A). pt able to lift R LE onto bed surface and (A) for LLE back onto bed.    Transfers Overall transfer level: Needs  assistance Equipment used: 1 person hand held assist Transfers: Sit to/from Stand Sit to Stand: Max assist           General transfer comment: face to face transfer      Balance Overall balance assessment: Needs assistance Sitting-balance support: Bilateral upper extremity supported, Feet supported Sitting balance-Leahy Scale: Poor                                     ADL either performed or assessed with clinical judgement   ADL Overall ADL's : Needs assistance/impaired Eating/Feeding: Minimal assistance Eating/Feeding Details (indicate cue type and reason): due to tremor wife helping support cup                 Lower Body Dressing: Maximal assistance   Toilet Transfer: Maximal assistance;Stand-pivot;BSC/3in1 Toilet Transfer Details (indicate cue type and reason): transfer to 3n1 due to prolonged constipation without any output or passing gas at all. Pt belching loudly and repetitve upright           General ADL Comments: encouraged gingerale to try for nausea. pt avoidng lunch tray at this time zero intake     Vision         Perception         Praxis         Pertinent Vitals/Pain Pain Assessment Pain Assessment: Faces Faces Pain Scale:  (nausea) Pain Location: belching alot once moving Pain Intervention(s): Monitored during session, Premedicated before session, Repositioned     Extremity/Trunk  Assessment Upper Extremity Assessment Upper Extremity Assessment: Generalized weakness;RUE deficits/detail;LUE deficits/detail RUE Deficits / Details: new tremor LUE Deficits / Details: new tremor   Lower Extremity Assessment Lower Extremity Assessment: Defer to PT evaluation   Cervical / Trunk Assessment Cervical / Trunk Assessment: Other exceptions Cervical / Trunk Exceptions: distended abdomen   Communication Communication Communication: Impaired Factors Affecting Communication: Hearing impaired   Cognition Arousal:  Alert Behavior During Therapy: Flat affect Cognition: Cognition impaired     Awareness: Intellectual awareness intact, Online awareness impaired     Executive functioning impairment (select all impairments): Sequencing, Problem solving OT - Cognition Comments: pt provided alcohol swap to smell due to nausea and pt attempted to eat it. Pt using calendar on wall to help with orientation but unaware otherwise. wife reports this is way different                 Following commands: Impaired Following commands impaired: Follows one step commands with increased time     Cueing  General Comments   Cueing Techniques: Verbal cues  VSS  MAP stayed the same throughout session   Exercises     Shoulder Instructions      Home Living Family/patient expects to be discharged to:: Private residence Living Arrangements: Spouse/significant other Available Help at Discharge: Family;Available 24 hours/day Type of Home: House Home Access: Stairs to enter [employer] of Steps: 2 Entrance Stairs-Rails: None Home Layout: One level     Bathroom Shower/Tub: Chief Strategy Officer: Standard     Home Equipment: None   Additional Comments: married to wife of almost 50 years.      Prior Functioning/Environment Prior Level of Function : Independent/Modified Independent             Mobility Comments: no AD, drove, grocery shopped, was going to outpt PT for LE strengthening as pt was having difficulty getting up from a chair ADLs Comments: indep    OT Problem List: Decreased strength;Decreased activity tolerance;Impaired balance (sitting and/or standing);Decreased cognition;Decreased safety awareness;Decreased knowledge of use of DME or AE;Decreased knowledge of precautions;Impaired UE functional use   OT Treatment/Interventions: Self-care/ADL training;Therapeutic exercise;Neuromuscular education;Energy conservation;DME and/or AE instruction;Manual  therapy;Therapeutic activities;Cognitive remediation/compensation;Patient/family education;Balance training      OT Goals(Current goals can be found in the care plan section)   Acute Rehab OT Goals Patient Stated Goal: to have bowel movement OT Goal Formulation: With patient/family Time For Goal Achievement: 11/26/23 Potential to Achieve Goals: Good   OT Frequency:  Min 2X/week    Co-evaluation              AM-PAC OT 6 Clicks Daily Activity     Outcome Measure Help from another person eating meals?: A Lot Help from another person taking care of personal grooming?: A Lot Help from another person toileting, which includes using toliet, bedpan, or urinal?: A Lot Help from another person bathing (including washing, rinsing, drying)?: A Lot Help from another person to put on and taking off regular upper body clothing?: A Lot Help from another person to put on and taking off regular lower body clothing?: A Lot 6 Click Score: 12   End of Session Nurse Communication: Mobility status;Precautions  Activity Tolerance: Patient tolerated treatment well Patient left: in bed;with call bell/phone within reach;with nursing/sitter in room;with family/visitor present  OT Visit Diagnosis: Unsteadiness on feet (R26.81);Muscle weakness (generalized) (M62.81)                Time: 8788-8769 OT Time Calculation (  min): 19 min Charges:  OT General Charges $OT Visit: 1 Visit OT Evaluation $OT Eval Moderate Complexity: 1 Mod   Brynn, OTR/L  Acute Rehabilitation Services Office: [phone] .   Ely Molt 11/12/2023, 1:14 PM

## 2023-11-12 NOTE — Progress Notes (Signed)
 PCCM Interval Note  Still no BM after tap water enema and already on Dulcolax, Miralax. - Add SMOG enema. - If no response, try Relistor.  Has ongoing L arm pain, minimally changed from prior. Troponin negative, low suspicion cardiac. - Increase Fentanyl  to 50 - 1002hrs PRN. - Valium 2.5mg  q4hrs PRN spasms.  Has nausea still, Zofran  not helping. - Change Zofran  to Phenergan . - KUB. - Might need NGT if no improvement.    Jack Gore, PA - [facility] For pager details, please see AMION or use Epic chat  After 1900, please call [facility] for cross coverage needs 11/12/2023, 4:52 PM

## 2023-11-12 NOTE — Progress Notes (Addendum)
 Patient ID: Jack [name], male   DOB: 28-Jun-1945, 78 y.o.   MRN: [id number] S: Per wife, he had a bad night.  Was confused and pulled his IV out.  Complaining about left arm pain O:BP 126/80   Pulse 66   Temp 97.7 F (36.5 C) (Oral)   Resp 12   Ht 5' 10 (1.778 m)   Wt 59.1 kg   SpO2 97%   BMI 18.69 kg/m   Intake/Output Summary (Last 24 hours) at 11/12/2023 0851 Last data filed at 11/12/2023 0615 Gross per 24 hour  Intake 1439.76 ml  Output 800 ml  Net 639.76 ml   Intake/Output: I/O last 3 completed shifts: In: 4031.4 [P.O.:500; I.V.:3531.4] Out: 1250 [Urine:1250]  Intake/Output this shift:  No intake/output data recorded. Weight change:  Gen: NAD CVS: RRR Resp:CTA Abd: distended, +BS, soft, NT Ext: no edema  Recent Labs  Lab 11/08/23 2111 11/10/23 0226 11/11/23 0238 11/11/23 1101 11/12/23 0311  NA 140 134* 131*  --  127*  K 3.8 4.2 4.8  --  5.4*  CL 103 108 105  --  103  CO2 25 21* 15*  --  14*  GLUCOSE 118* 113* 103*  --  110*  BUN 33* 31* 35*  --  43*  CREATININE 1.90* 2.37* 2.58*  --  2.86*  ALBUMIN 3.8  --   --  2.6*  --   CALCIUM 9.7 8.2* 8.4*  --  8.5*  PHOS  --  3.2  --   --  4.8*  AST 25  --   --  14*  --   ALT 20  --   --  14  --    Liver Function Tests: Recent Labs  Lab 11/08/23 2111 11/11/23 1101  AST 25 14*  ALT 20 14  ALKPHOS 66 55  BILITOT 0.3 0.9  PROT 6.7 5.3*  ALBUMIN 3.8 2.6*   Recent Labs  Lab 11/08/23 2111  LIPASE 38   Recent Labs  Lab 11/11/23 1101  AMMONIA 25   CBC: Recent Labs  Lab 11/08/23 2111 11/10/23 0226 11/11/23 0238 11/12/23 0311  WBC 8.5 10.1 10.1 10.9*  NEUTROABS 4.5  --   --   --   HGB 16.2 14.2 13.8 12.6*  HCT 47.3 41.5 41.5 36.2*  MCV 90.4 89.4 92.2 88.1  PLT 251 217 180 184   Cardiac Enzymes: No results for input(s): CKTOTAL, CKMB, CKMBINDEX, TROPONINI in the last 168 hours. CBG: Recent Labs  Lab 11/09/23 1414 11/10/23 0854  GLUCAP 102* 125*    Iron Studies: No results for  input(s): IRON, TIBC, TRANSFERRIN, FERRITIN in the last 72 hours. Studies/Results: US  RENAL Result Date: 11/11/2023 CLINICAL DATA:  Hydronephrosis. EXAM: RENAL / URINARY TRACT ULTRASOUND COMPLETE COMPARISON:  MRA abdomen 11/09/2023. ultrasound kidneys 01/16/2019. FINDINGS: Right Kidney: Renal measurements: 13.7 x 7.9 x 8.6 cm = volume: 487.4 mL. Severe parenchymal atrophy identified with severe collecting system dilatation diffusely. This is seen on the previous examinations. On recent MRI there was an abrupt caliber change at the UPJ. This could be a chronic UPJ obstruction. Left Kidney: Renal measurements: 10.5 x 5.7 x 6.4 cm = volume: 201.2 mL. Global parenchymal atrophy. Echogenic renal parenchyma. No collecting system dilatation. Slight perinephric fluid. There is anechoic lower pole focus with through transmission and thin wall measuring 4.2 x 3.2 x 3.3 cm consistent with a cyst. Bladder: Appears normal for degree of bladder distention. Other: Prostate is lobular and has mass effect along the base of the  bladder measuring 3.4 x 4.5 x 5.1 cm. Please correlate for any known history or if needed correlation with the patient's PSA IMPRESSION: Severe dilatation of the right renal collecting system with parenchymal atrophy. Based on previous imaging this could be a chronic UPJ. Small left kidney with echogenic parenchyma and simple cyst. Enlarged prostate. Electronically Signed   By: Ranell Bring M.D.   On: 11/11/2023 14:38   DG Abd 1 View Result Date: 11/11/2023 CLINICAL DATA:  Ileus.  Respiratory failure. EXAM: ABDOMEN - 1 VIEW; PORTABLE CHEST - 1 VIEW COMPARISON:  Chest CT dated 11/09/2023. FINDINGS: There is shallow inspiration with bibasilar atelectasis. Mild cardiomegaly with mild vascular congestion. No pleural effusion or pneumothorax. Atherosclerotic calcification of the aortic arch. There is moderate stool in the colon. Top-normal caliber loops of small bowel measure up to 3 cm, possibly  ileus. Air is noted throughout the colon. No free air noted. No acute osseous pathology. IMPRESSION: 1. Shallow inspiration with bibasilar atelectasis. 2. Mild cardiomegaly with mild vascular congestion. 3. Possible ileus. Electronically Signed   By: Vanetta Chou M.D.   On: 11/11/2023 14:32   DG Chest Port 1 View Result Date: 11/11/2023 CLINICAL DATA:  Ileus.  Respiratory failure. EXAM: ABDOMEN - 1 VIEW; PORTABLE CHEST - 1 VIEW COMPARISON:  Chest CT dated 11/09/2023. FINDINGS: There is shallow inspiration with bibasilar atelectasis. Mild cardiomegaly with mild vascular congestion. No pleural effusion or pneumothorax. Atherosclerotic calcification of the aortic arch. There is moderate stool in the colon. Top-normal caliber loops of small bowel measure up to 3 cm, possibly ileus. Air is noted throughout the colon. No free air noted. No acute osseous pathology. IMPRESSION: 1. Shallow inspiration with bibasilar atelectasis. 2. Mild cardiomegaly with mild vascular congestion. 3. Possible ileus. Electronically Signed   By: Vanetta Chou M.D.   On: 11/11/2023 14:32   ECHOCARDIOGRAM LIMITED Result Date: 11/11/2023    ECHOCARDIOGRAM LIMITED REPORT   Patient Name:   Jack [name] [facility] Date of Exam: 11/11/2023 Medical Rec #:  [id number]     Height:       70.0 in Accession #:    [id number]    Weight:       130.3 lb Date of Birth:  1945/08/11     BSA:          1.740 m Patient Age:    78 years      BP:           118/71 mmHg Patient Gender: M             HR:           67 bpm. Exam Location:  Inpatient Procedure: Cardiac Doppler, Color Doppler, Limited Echo and 2D Echo (Both            Spectral and Color Flow Doppler were utilized during procedure). Indications:    CHF- Acute Diastolic I50.31  History:        Patient has prior history of Echocardiogram examinations, most                 recent 07/04/2023. Type B Aortic Dissection, CAD; Risk                 Factors:Hypertension.  Sonographer:    Koleen Popper RDCS Referring  Phys: [id number] TORIBIO BROCKS SMITH IMPRESSIONS  1. Left ventricular ejection fraction, by estimation, is 60 to 65%. The left ventricle has normal function. The left ventricle has no regional wall motion abnormalities. Left ventricular diastolic function could  not be evaluated.  2. Right ventricular systolic function is normal. The right ventricular size is normal.  3. The mitral valve is normal in structure.  4. The aortic valve is tricuspid. Aortic valve regurgitation is not visualized. No aortic stenosis is present.  5. The inferior vena cava is normal in size with <50% respiratory variability, suggesting right atrial pressure of 8 mmHg. FINDINGS  Left Ventricle: Left ventricular ejection fraction, by estimation, is 60 to 65%. The left ventricle has normal function. The left ventricle has no regional wall motion abnormalities. The left ventricular internal cavity size was normal in size. There is  no left ventricular hypertrophy. Left ventricular diastolic function could not be evaluated. Right Ventricle: The right ventricular size is normal. No increase in right ventricular wall thickness. Right ventricular systolic function is normal. Left Atrium: Left atrial size was normal in size. Right Atrium: Right atrial size was normal in size. Mitral Valve: The mitral valve is normal in structure. Tricuspid Valve: The tricuspid valve is normal in structure. Aortic Valve: The aortic valve is tricuspid. Aortic valve regurgitation is not visualized. No aortic stenosis is present. Pulmonic Valve: The pulmonic valve was not well visualized. Aorta: The aortic root and ascending aorta are structurally normal, with no evidence of dilitation. Venous: The inferior vena cava is normal in size with less than 50% respiratory variability, suggesting right atrial pressure of 8 mmHg. Additional Comments: Spectral Doppler performed. Color Doppler performed.  LEFT VENTRICLE PLAX 2D LVIDd:         5.70 cm LVIDs:         3.60 cm LV PW:          1.10 cm LV IVS:        1.10 cm LVOT diam:     2.10 cm LVOT Area:     3.46 cm  IVC IVC diam: 2.20 cm LEFT ATRIUM         Index LA diam:    4.00 cm 2.30 cm/m   AORTA Ao Root diam: 3.90 cm Ao Asc diam:  3.70 cm MITRAL VALVE MV Area (PHT): 3.42 cm    SHUNTS MV Decel Time: 222 msec    Systemic Diam: 2.10 cm MV E velocity: 89.60 cm/s MV A velocity: 85.90 cm/s MV E/A ratio:  1.04 Morene Brownie Electronically signed by Morene Brownie Signature Date/Time: 11/11/2023/10:25:45 AM    Final     amLODipine   10 mg Oral QPM   aspirin  EC  81 mg Oral QPM   bisacodyl  10 mg Oral Daily   calcium carbonate  400 mg of elemental calcium Oral TID WC   Chlorhexidine  Gluconate Cloth  6 each Topical Daily   cloNIDine  0.2 mg Oral TID   feeding supplement  237 mL Oral BID BM   isosorbide  mononitrate  30 mg Oral Daily   labetalol   200 mg Oral TID   melatonin  3 mg Oral QHS   metoCLOPramide (REGLAN) injection  5 mg Intravenous Q8H   pantoprazole   40 mg Oral Q1200   polyethylene glycol  17 g Oral Daily   sodium zirconium cyclosilicate  10 g Oral Daily   tamsulosin  0.4 mg Oral Daily    BMET    Component Value Date/Time   NA 127 (L) 11/12/2023 0311   K 5.4 (H) 11/12/2023 0311   CL 103 11/12/2023 0311   CO2 14 (L) 11/12/2023 0311   GLUCOSE 110 (H) 11/12/2023 0311   BUN 43 (H) 11/12/2023 0311   CREATININE  2.86 (H) 11/12/2023 0311   CALCIUM 8.5 (L) 11/12/2023 0311   GFRNONAA 22 (L) 11/12/2023 0311   GFRAA 56 (L) 12/24/2013 1316   CBC    Component Value Date/Time   WBC 10.9 (H) 11/12/2023 0311   RBC 4.11 (L) 11/12/2023 0311   HGB 12.6 (L) 11/12/2023 0311   HCT 36.2 (L) 11/12/2023 0311   PLT 184 11/12/2023 0311   MCV 88.1 11/12/2023 0311   MCH 30.7 11/12/2023 0311   MCHC 34.8 11/12/2023 0311   RDW 13.0 11/12/2023 0311   LYMPHSABS 2.3 11/08/2023 2111   MONOABS 1.2 (H) 11/08/2023 2111   EOSABS 0.3 11/08/2023 2111   BASOSABS 0.1 11/08/2023 2111    Assessment/Plan:   AKI/CKD stage IIIb - baseline Scr  around 1.9-2.  He has a solitary functioning kidney on the left due to chronic UPJ obstruction on the right.  Bump in Scr is likely hemodynamically mediated with rapid drop in Bp due to aortic dissection.  Also possible contribution from urinary retention.  UOP has dropped over the last 24 hours.  Agree with foley catheter and hold off on lasix  for now.  His Scr may continue to climb over the next 24-48 hours.  Maintain MAP of 65 or greater.  Currently no uremic symptoms or urgent indication for dialysis at this time.  Will dose with IV lasix  80 mg bid today given hyperkalemia and decreased UOP.  Hopefully his Scr will stabilize and he can proceed with CTA.  Otherwise, we may need to initiate dialysis if his numbers continue to worsen or he develops symptoms.    Avoid nephrotoxic medications including NSAIDs and iodinated intravenous contrast exposure unless the latter is absolutely indicated.   Preferred narcotic agents for pain control are hydromorphone , fentanyl , and methadone. Morphine  should not be used.  Avoid Baclofen and avoid oral sodium phosphate  and magnesium  citrate based laxatives / bowel preps.  Continue strict Input and Output monitoring. Will monitor the patient closely with you and intervene or adjust therapy as indicated by changes in clinical status/labs  Acute aortic syndrome - concerning for aortic dissection type B.  Vascular surgery is following.  Holding off on CTA for now given AKI.  Blood pressures markedly improved with cleviprex and clonidine. Hypertensive emergency  - improved with esmolol  and nicardipine .  Now with clonidine and cleviprex. NAGMA - agree with starting sodium bicarb tablets and follow.  Paroxysmal atrial fibrillation - long term flecanide, not on anticoagulation. Urinary retention - agree with foley catheter Hyponatremia - mild and normal Sosm.  Likely due to AKI and impaired free water excretion.  Will add lasix  today and follow.  Continue to follow.   Hyperkalemia - started lokelma 10 gm daily and lasix  as above.  Resting tremors - apparently new during this admission.  Do not think AKI is related to this.  Continue to follow.  Fairy RONAL Sellar, MD [employer] [phone]

## 2023-11-13 ENCOUNTER — Inpatient Hospital Stay (HOSPITAL_COMMUNITY)

## 2023-11-13 DIAGNOSIS — N179 Acute kidney failure, unspecified: Secondary | ICD-10-CM

## 2023-11-13 DIAGNOSIS — I480 Paroxysmal atrial fibrillation: Secondary | ICD-10-CM | POA: Diagnosis not present

## 2023-11-13 DIAGNOSIS — I161 Hypertensive emergency: Secondary | ICD-10-CM | POA: Diagnosis not present

## 2023-11-13 DIAGNOSIS — I71012 Dissection of descending thoracic aorta: Secondary | ICD-10-CM | POA: Diagnosis not present

## 2023-11-13 DIAGNOSIS — E785 Hyperlipidemia, unspecified: Secondary | ICD-10-CM | POA: Diagnosis not present

## 2023-11-13 LAB — BASIC METABOLIC PANEL WITH GFR
Anion gap: 10 (ref 5–15)
BUN: 51 mg/dL — ABNORMAL HIGH (ref 8–23)
CO2: 23 mmol/L (ref 22–32)
Calcium: 8.9 mg/dL (ref 8.9–10.3)
Chloride: 96 mmol/L — ABNORMAL LOW (ref 98–111)
Creatinine, Ser: 3.23 mg/dL — ABNORMAL HIGH (ref 0.61–1.24)
GFR, Estimated: 19 mL/min — ABNORMAL LOW (ref >60)
Glucose, Bld: 115 mg/dL — ABNORMAL HIGH (ref 70–99)
Potassium: 4 mmol/L (ref 3.5–5.1)
Sodium: 129 mmol/L — ABNORMAL LOW (ref 135–145)

## 2023-11-13 LAB — CBC
HCT: 39.6 % (ref 39.0–52.0)
Hemoglobin: 14 g/dL (ref 13.0–17.0)
MCH: 30.3 pg (ref 26.0–34.0)
MCHC: 35.4 g/dL (ref 30.0–36.0)
MCV: 85.7 fL (ref 80.0–100.0)
Platelets: 223 10*3/uL (ref 150–400)
RBC: 4.62 MIL/uL (ref 4.22–5.81)
RDW: 13.4 % (ref 11.5–15.5)
WBC: 7.8 10*3/uL (ref 4.0–10.5)
nRBC: 0 % (ref 0.0–0.2)

## 2023-11-13 LAB — MAGNESIUM: Magnesium: 2.2 mg/dL (ref 1.7–2.4)

## 2023-11-13 LAB — PHOSPHORUS: Phosphorus: 6.8 mg/dL — ABNORMAL HIGH (ref 2.5–4.6)

## 2023-11-13 MED ORDER — LABETALOL HCL 5 MG/ML IV SOLN
0.5000 mg/min | Status: DC
  Administered 2023-11-13 – 2023-11-14 (×3): 0.5 mg/min via INTRAVENOUS
  Filled 2023-11-13 (×4): qty 80

## 2023-11-13 MED ORDER — ACETAMINOPHEN 650 MG RE SUPP
650.0000 mg | RECTAL | Status: DC | PRN
  Administered 2023-11-13: 650 mg via RECTAL
  Filled 2023-11-13: qty 1

## 2023-11-13 MED ORDER — PHENOL 1.4 % MT LIQD
1.0000 | OROMUCOSAL | Status: DC | PRN
  Filled 2023-11-13: qty 177

## 2023-11-13 MED ORDER — PANTOPRAZOLE SODIUM 40 MG IV SOLR
40.0000 mg | Freq: Every day | INTRAVENOUS | Status: DC
  Administered 2023-11-13 – 2023-11-16 (×4): 40 mg via INTRAVENOUS
  Filled 2023-11-13 (×4): qty 10

## 2023-11-13 MED ORDER — TRAMADOL HCL 50 MG PO TABS: 50.0000 mg | ORAL_TABLET | Freq: Two times a day (BID) | ORAL | Status: DC | PRN

## 2023-11-13 NOTE — Progress Notes (Signed)
 Patient ID: Jack [name], male   DOB: 1945-07-16, 78 y.o.   MRN: [id number] S: Feels better after NGT placed.   O:BP 123/71   Pulse 91   Temp 98.1 F (36.7 C) (Oral)   Resp 10   Ht 5' 10 (1.778 m)   Wt 52.9 kg   SpO2 95%   BMI 16.73 kg/m   Intake/Output Summary (Last 24 hours) at 11/13/2023 0800 Last data filed at 11/13/2023 0557 Gross per 24 hour  Intake 422.85 ml  Output 4375 ml  Net -3952.15 ml   Intake/Output: I/O last 3 completed shifts: In: 987.9 [P.O.:910; I.V.:77.9] Out: 4875 [Urine:2775; Emesis/NG output:2100]  Intake/Output this shift:  No intake/output data recorded. Weight change:  Gen: NAD CVS: RRR Resp:CTA Abd: distended, hypoactive bowel sounds, nontender Ext: no edema  Recent Labs  Lab 11/08/23 2111 11/10/23 0226 11/11/23 0238 11/11/23 1101 11/12/23 0311 11/13/23 0348  NA 140 134* 131*  --  127* 129*  K 3.8 4.2 4.8  --  5.4* 4.0  CL 103 108 105  --  103 96*  CO2 25 21* 15*  --  14* 23  GLUCOSE 118* 113* 103*  --  110* 115*  BUN 33* 31* 35*  --  43* 51*  CREATININE 1.90* 2.37* 2.58*  --  2.86* 3.23*  ALBUMIN 3.8  --   --  2.6*  --   --   CALCIUM 9.7 8.2* 8.4*  --  8.5* 8.9  PHOS  --  3.2  --   --  4.8* 6.8*  AST 25  --   --  14*  --   --   ALT 20  --   --  14  --   --    Liver Function Tests: Recent Labs  Lab 11/08/23 2111 11/11/23 1101  AST 25 14*  ALT 20 14  ALKPHOS 66 55  BILITOT 0.3 0.9  PROT 6.7 5.3*  ALBUMIN 3.8 2.6*   Recent Labs  Lab 11/08/23 2111  LIPASE 38   Recent Labs  Lab 11/11/23 1101  AMMONIA 25   CBC: Recent Labs  Lab 11/08/23 2111 11/10/23 0226 11/11/23 0238 11/12/23 0311 11/13/23 0348  WBC 8.5 10.1 10.1 10.9* 7.8  NEUTROABS 4.5  --   --   --   --   HGB 16.2 14.2 13.8 12.6* 14.0  HCT 47.3 41.5 41.5 36.2* 39.6  MCV 90.4 89.4 92.2 88.1 85.7  PLT 251 217 180 184 223   Cardiac Enzymes: No results for input(s): CKTOTAL, CKMB, CKMBINDEX, TROPONINI in the last 168 hours. CBG: Recent Labs  Lab  11/09/23 1414 11/10/23 0854 11/12/23 2312  GLUCAP 102* 125* 141*    Iron Studies: No results for input(s): IRON, TIBC, TRANSFERRIN, FERRITIN in the last 72 hours. Studies/Results: DG Abd 1 View Result Date: 11/12/2023 CLINICAL DATA:  Nausea, emesis EXAM: ABDOMEN - 1 VIEW COMPARISON:  Earlier same day abdominal radiograph. FINDINGS: AP portable upright view of the upper abdomen and AP supine view of the lower abdomen. There are multiple dilated gas-filled loops of bowel throughout the abdomen which measure up to 3.7 cm in diameter increased from the prior study. No radiographic evidence of focal transition point. Moderate stool burden in the ascending colon. No free air under the diaphragm. IMPRESSION: Dilated gas-filled loops of bowel which are slightly increased from the earlier same day radiograph concerning for ileus versus partial small bowel obstruction. Recommend correlation with abdominal exam and serial radiographs. Electronically Signed   By: Donnice  Hunt M.D.   On: 11/12/2023 19:15   DG Abd 1 View Result Date: 11/12/2023 CLINICAL DATA:  Ileus. EXAM: ABDOMEN - 1 VIEW COMPARISON:  Abdominal radiograph dated 11/11/2023. FINDINGS: Similar gas distended loops of small bowel measuring upper limits of normal at 3 cm, which may reflect ileus. Air is noted throughout the colon with moderate stool in the ascending colon. No evidence of free air. Visualized osseous structures are unchanged. IMPRESSION: Similar gas distended loops of small bowel measuring upper limits of normal, which may reflect ileus. Electronically Signed   By: Harrietta Sherry M.D.   On: 11/12/2023 09:47   US  RENAL Result Date: 11/11/2023 CLINICAL DATA:  Hydronephrosis. EXAM: RENAL / URINARY TRACT ULTRASOUND COMPLETE COMPARISON:  MRA abdomen 11/09/2023. ultrasound kidneys 01/16/2019. FINDINGS: Right Kidney: Renal measurements: 13.7 x 7.9 x 8.6 cm = volume: 487.4 mL. Severe parenchymal atrophy identified with severe  collecting system dilatation diffusely. This is seen on the previous examinations. On recent MRI there was an abrupt caliber change at the UPJ. This could be a chronic UPJ obstruction. Left Kidney: Renal measurements: 10.5 x 5.7 x 6.4 cm = volume: 201.2 mL. Global parenchymal atrophy. Echogenic renal parenchyma. No collecting system dilatation. Slight perinephric fluid. There is anechoic lower pole focus with through transmission and thin wall measuring 4.2 x 3.2 x 3.3 cm consistent with a cyst. Bladder: Appears normal for degree of bladder distention. Other: Prostate is lobular and has mass effect along the base of the bladder measuring 3.4 x 4.5 x 5.1 cm. Please correlate for any known history or if needed correlation with the patient's PSA IMPRESSION: Severe dilatation of the right renal collecting system with parenchymal atrophy. Based on previous imaging this could be a chronic UPJ. Small left kidney with echogenic parenchyma and simple cyst. Enlarged prostate. Electronically Signed   By: Ranell Bring M.D.   On: 11/11/2023 14:38   DG Abd 1 View Result Date: 11/11/2023 CLINICAL DATA:  Ileus.  Respiratory failure. EXAM: ABDOMEN - 1 VIEW; PORTABLE CHEST - 1 VIEW COMPARISON:  Chest CT dated 11/09/2023. FINDINGS: There is shallow inspiration with bibasilar atelectasis. Mild cardiomegaly with mild vascular congestion. No pleural effusion or pneumothorax. Atherosclerotic calcification of the aortic arch. There is moderate stool in the colon. Top-normal caliber loops of small bowel measure up to 3 cm, possibly ileus. Air is noted throughout the colon. No free air noted. No acute osseous pathology. IMPRESSION: 1. Shallow inspiration with bibasilar atelectasis. 2. Mild cardiomegaly with mild vascular congestion. 3. Possible ileus. Electronically Signed   By: Vanetta Chou M.D.   On: 11/11/2023 14:32   DG Chest Port 1 View Result Date: 11/11/2023 CLINICAL DATA:  Ileus.  Respiratory failure. EXAM: ABDOMEN - 1  VIEW; PORTABLE CHEST - 1 VIEW COMPARISON:  Chest CT dated 11/09/2023. FINDINGS: There is shallow inspiration with bibasilar atelectasis. Mild cardiomegaly with mild vascular congestion. No pleural effusion or pneumothorax. Atherosclerotic calcification of the aortic arch. There is moderate stool in the colon. Top-normal caliber loops of small bowel measure up to 3 cm, possibly ileus. Air is noted throughout the colon. No free air noted. No acute osseous pathology. IMPRESSION: 1. Shallow inspiration with bibasilar atelectasis. 2. Mild cardiomegaly with mild vascular congestion. 3. Possible ileus. Electronically Signed   By: Vanetta Chou M.D.   On: 11/11/2023 14:32   ECHOCARDIOGRAM LIMITED Result Date: 11/11/2023    ECHOCARDIOGRAM LIMITED REPORT   Patient Name:   Jack [name] Date of Exam: 11/11/2023 Medical Rec #:  [id number]  Height:       70.0 in Accession #:    [id number]    Weight:       130.3 lb Date of Birth:  [birth year]     BSA:          1.740 m Patient Age:    78 years      BP:           118/71 mmHg Patient Gender: M             HR:           67 bpm. Exam Location:  Inpatient Procedure: Cardiac Doppler, Color Doppler, Limited Echo and 2D Echo (Both            Spectral and Color Flow Doppler were utilized during procedure). Indications:    CHF- Acute Diastolic I50.31  History:        Patient has prior history of Echocardiogram examinations, most                 recent 07/04/2023. Type B Aortic Dissection, CAD; Risk                 Factors:Hypertension.  Sonographer:    Koleen Popper RDCS Referring Phys: [id number] TORIBIO BROCKS SMITH IMPRESSIONS  1. Left ventricular ejection fraction, by estimation, is 60 to 65%. The left ventricle has normal function. The left ventricle has no regional wall motion abnormalities. Left ventricular diastolic function could not be evaluated.  2. Right ventricular systolic function is normal. The right ventricular size is normal.  3. The mitral valve is normal in structure.  4.  The aortic valve is tricuspid. Aortic valve regurgitation is not visualized. No aortic stenosis is present.  5. The inferior vena cava is normal in size with <50% respiratory variability, suggesting right atrial pressure of 8 mmHg. FINDINGS  Left Ventricle: Left ventricular ejection fraction, by estimation, is 60 to 65%. The left ventricle has normal function. The left ventricle has no regional wall motion abnormalities. The left ventricular internal cavity size was normal in size. There is  no left ventricular hypertrophy. Left ventricular diastolic function could not be evaluated. Right Ventricle: The right ventricular size is normal. No increase in right ventricular wall thickness. Right ventricular systolic function is normal. Left Atrium: Left atrial size was normal in size. Right Atrium: Right atrial size was normal in size. Mitral Valve: The mitral valve is normal in structure. Tricuspid Valve: The tricuspid valve is normal in structure. Aortic Valve: The aortic valve is tricuspid. Aortic valve regurgitation is not visualized. No aortic stenosis is present. Pulmonic Valve: The pulmonic valve was not well visualized. Aorta: The aortic root and ascending aorta are structurally normal, with no evidence of dilitation. Venous: The inferior vena cava is normal in size with less than 50% respiratory variability, suggesting right atrial pressure of 8 mmHg. Additional Comments: Spectral Doppler performed. Color Doppler performed.  LEFT VENTRICLE PLAX 2D LVIDd:         5.70 cm LVIDs:         3.60 cm LV PW:         1.10 cm LV IVS:        1.10 cm LVOT diam:     2.10 cm LVOT Area:     3.46 cm  IVC IVC diam: 2.20 cm LEFT ATRIUM         Index LA diam:    4.00 cm 2.30 cm/m   AORTA Ao Root diam: 3.90 cm Ao Asc  diam:  3.70 cm MITRAL VALVE MV Area (PHT): 3.42 cm    SHUNTS MV Decel Time: 222 msec    Systemic Diam: 2.10 cm MV E velocity: 89.60 cm/s MV A velocity: 85.90 cm/s MV E/A ratio:  1.04 Morene Brownie Electronically  signed by Morene Brownie Signature Date/Time: 11/11/2023/10:25:45 AM    Final     amLODipine   10 mg Oral QPM   aspirin  EC  81 mg Oral QPM   bisacodyl  10 mg Oral Daily   calcium carbonate  400 mg of elemental calcium Oral TID WC   Chlorhexidine  Gluconate Cloth  6 each Topical Daily   cloNIDine  0.3 mg Oral TID   feeding supplement  237 mL Oral BID BM   furosemide   80 mg Intravenous BID   isosorbide  mononitrate  60 mg Oral Daily   labetalol   200 mg Oral TID   melatonin  3 mg Oral QHS   metoCLOPramide (REGLAN) injection  5 mg Intravenous Q8H   pantoprazole   40 mg Oral Q1200   polyethylene glycol  17 g Oral Daily   sodium bicarbonate  650 mg Oral TID   tamsulosin  0.4 mg Oral Daily    BMET    Component Value Date/Time   NA 129 (L) 11/13/2023 0348   K 4.0 11/13/2023 0348   CL 96 (L) 11/13/2023 0348   CO2 23 11/13/2023 0348   GLUCOSE 115 (H) 11/13/2023 0348   BUN 51 (H) 11/13/2023 0348   CREATININE 3.23 (H) 11/13/2023 0348   CALCIUM 8.9 11/13/2023 0348   GFRNONAA 19 (L) 11/13/2023 0348   GFRAA 56 (L) 12/24/2013 1316   CBC    Component Value Date/Time   WBC 7.8 11/13/2023 0348   RBC 4.62 11/13/2023 0348   HGB 14.0 11/13/2023 0348   HCT 39.6 11/13/2023 0348   PLT 223 11/13/2023 0348   MCV 85.7 11/13/2023 0348   MCH 30.3 11/13/2023 0348   MCHC 35.4 11/13/2023 0348   RDW 13.4 11/13/2023 0348   LYMPHSABS 2.3 11/08/2023 2111   MONOABS 1.2 (H) 11/08/2023 2111   EOSABS 0.3 11/08/2023 2111   BASOSABS 0.1 11/08/2023 2111    Assessment/Plan:   AKI/CKD stage IIIb - baseline Scr around 1.9-2.  He has a solitary functioning kidney on the left due to chronic UPJ obstruction on the right.  Bump in Scr is likely hemodynamically mediated with rapid drop in Bp due to aortic dissection.  Also possible contribution from urinary retention.  UOP has dropped over the last 24 hours.  Agree with foley catheter and hold off on lasix  for now.  His Scr may continue to climb over the next 24-48  hours.  Maintain MAP of 65 or greater.  Currently no uremic symptoms or urgent indication for dialysis at this time.  Started on IV lasix  80 mg bid with improvement of hyperkalemia and UOP.  Unfortunately, he now has developed ileus/partial SBO with N/V.  Will stop IV lasix  for now and follow.  Hopefully his Scr will stabilize and he can proceed with CTA.  Otherwise, we may need to initiate dialysis if his numbers continue to worsen or he develops symptoms.    Avoid nephrotoxic medications including NSAIDs and iodinated intravenous contrast exposure unless the latter is absolutely indicated.   Preferred narcotic agents for pain control are hydromorphone , fentanyl , and methadone. Morphine  should not be used.  Avoid Baclofen and avoid oral sodium phosphate  and magnesium  citrate based laxatives / bowel preps.  Continue strict Input and Output  monitoring. Will monitor the patient closely with you and intervene or adjust therapy as indicated by changes in clinical status/labs  Acute aortic syndrome - concerning for aortic dissection type B.  Vascular surgery is following.  Holding off on CTA for now given AKI.  Blood pressures markedly improved with cleviprex and clonidine. Hypertensive emergency  - improved with esmolol  and nicardipine .  Now with clonidine and cleviprex. Ileus vs partial SBO - has had refractory N/V and NGT placed with some improvement of symptoms. NAGMA - agree with starting sodium bicarb tablets and follow.  Paroxysmal atrial fibrillation - long term flecanide, not on anticoagulation. Urinary retention - agree with foley catheter Hyponatremia - mild and normal Sosm.  Likely due to AKI and impaired free water excretion.  Improved with lasix .  Continue to follow.  Hyperkalemia - started lokelma 10 gm daily and lasix  as above.  Resting tremors - apparently new during this admission.  Do not think AKI is related to this.  Continue to follow. Appears to have resolved  Fairy RONAL Sellar,  MD [facility] [phone]

## 2023-11-13 NOTE — Progress Notes (Signed)
 Attempted to place NG tube; unsuccessful. Patient vomited multiple times during attempts; of brown emesis in bag and significant amount of emesis on bed. Patient reports feeling slightly improved after vomiting.

## 2023-11-13 NOTE — Progress Notes (Signed)
 Inpatient Rehabilitation Admissions Coordinator   Rehab consult received. Noted ongoing medical issues. I will follow at a distance as we await further medical workup and progress.  Heron Leavell, RN, MSN Rehab Admissions Coordinator [phone] 11/13/2023 10:37 AM

## 2023-11-13 NOTE — Progress Notes (Signed)
 NAME:  Jack [name], MRN:  [id number], DOB:  [birth year], LOS: 4 ADMISSION DATE:  11/08/2023, CONSULTATION DATE:  11/09/2023 REFERRING MD: Kommor - EDP, CHIEF COMPLAINT:   Back pain   History of Present Illness:  Jack [name] is a 78 y.o. male with a PMH significant for CAD, CKD, HLD, HTN, PAF, and asthma who presented to the ED with complaints of sudden onset upper back pain. On ED arrival patient was seen hypertensive with BP 180/120, all other vital signs within normal.  Lab work significant for glucose 118, BUN 33, creatinine 190 with GFR 36, all other vital signs within normal limits.  Given concern for possible dissection MR angio chest was obtained and concerning for proximal descending thoracic aorta dissection.  Given AKI CT chest without contrast was also obtained and again concerning for intramural hematoma or contained perforation.  Patient was started on esmolol  and nicardipine  drips with PCCM and vascular consults.  Pertinent Medical History:  CAD, CKD, HLD, HTN, PAF, and asthma   Significant Hospital Events: Including procedures, antibiotic start and stop dates in addition to other pertinent events   6/21 Presented with upper back pain imaging concerning for aortic dissection admitted to ICU for esmolol  and nicardipine  drips 6/23 Esmolol  changed to labetalol , esmolol  changed to cleviprex, labetalol  PO dose increased  Interim History / Subjective:  No significant events NGT placement overnight Vomited several times during attempt , felt better afterward Feels like he got hit by a truck, but overall better than prior Belly appears to be decompressed BP at goal on labetalol  gtt  Objective:   Blood pressure 123/71, pulse 91, temperature 98.1 F (36.7 C), temperature source Oral, resp. rate 10, height 5' 10 (1.778 m), weight 52.9 kg, SpO2 95%.        Intake/Output Summary (Last 24 hours) at 11/13/2023 0727 Last data filed at 11/13/2023 0557 Gross per 24 hour  Intake  422.85 ml  Output 4375 ml  Net -3952.15 ml   Filed Weights   11/08/23 2103 11/11/23 0500 11/13/23 0240  Weight: 66.7 kg 59.1 kg 52.9 kg   Physical Examination: General: Chronically ill-appearing elderly man in NAD. Pleasant and conversant. HEENT: [place]/AT, anicteric sclera, PERRL, moist mucous membranes. Neuro: Awake, oriented x 4. Responds to verbal stimuli. Following commands consistently. Moves all 4 extremities spontaneously. Generalized weakness. CV: RRR, no m/g/r. PULM: Breathing even and unlabored on 2LNC. Lung fields diminished at bilateral bases. GI: Soft, nontender, mildly distended. Normoactive bowel sounds. Extremities: No LE edema noted. Skin: Warm/dry, no rashes.  Assessment and Plan:   Acute aortic dissection, type B Hypertensive emergency - Remains in ICU - Goal HR < 80, SBP < 120 - BP/HR at goal with labetalol  gtt - Imdur , Clonidine, amlodipine  discontinued - US  Renal Artery Duplex completed, negative for significant stenosis - Repeat CTA per VVS, holding due to renal function/resolution of CP/back pain  History of CAD Essential hypertension  Hyperlipidemia  Paroxysmal atrial fibrillation  - Unable to tolerate Eliquis , Pradaxa , or Xarelto  as anticoagulation due to drug rash. Long-term flecainide  for rate control. No statins due to myalgias. 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  Patient had a AV fistula placed in  2011 and later removed in 2015 as he never progressed to ESRD. Renal US  6/23 without new obstruction. Urinary retention Hyperkalemia, improved Hyponatremia, improving - Nephrology following, appreciate assistance - Trend BMP -  Replete electrolytes as indicated - Hold further Lasix  per Nephro - Monitor I&Os, Foley in place - Continue Flomax - Avoid nephrotoxic agents as able - Ensure adequate renal perfusion  NAGMA, improved - Bicarb tabs  Abdominal distention and constipation - no BM this admit - Continue Reglan, monitor Qtc - Neostigmine trial - Bowel regimen - PT/OT, OOB as tolerated  L arm pain - localized to biceps area, non-radiating. Doubt cardiac/ischemia related, likely MSK Mild troponin elevation, not significant and pain resolved 6/25AM. - Encourage OOB, ROM, PT as tolerated - Supportive care  Resting tremor of UE's - unclear etiology, new per wife this admit. LFT's and ammonia OK. - Supportive care  Generalized deconditioning - hospitalized in ICU since P{M 6/20 - PT/OT - RD/Nutrition consult as indicated - High protein diet + supplements (Ensure) - Possible CIR for dispo, eval pending  Best Practice (right click and Reselect all SmartList Selections daily)   Diet/type: Regular consistency (see orders) DVT prophylaxis SCDs Pressure ulcer(s): N/A GI prophylaxis: PPI Lines: N/A Foley:  N/A Code Status:  full code Last date of multidisciplinary goals of care discussion: 6/25 - Updated at bedside  Critical care time:   The patient is critically ill with multiple organ system failure and requires high complexity decision making for assessment and support, frequent evaluation and titration of therapies, advanced monitoring, review of radiographic studies and interpretation of complex data.   Critical Care Time devoted to patient care services, exclusive of separately billable procedures, described in this note is 32 minutes.  Jack CHRISTELLA Jack Karaffa, PA-C [facility] 11/13/23 7:27 AM  Please see [email or web address] for pager details.  From 7A-7P if no response, please call [phone] After hours, please call ELink [phone]

## 2023-11-13 NOTE — Progress Notes (Signed)
 No renal artery stenosis appreciated.  Will continue to medically manage AKI under ICU and Dr. Rayburn nephrology.  Michael Walrath E Dasie Chancellor

## 2023-11-13 NOTE — Progress Notes (Signed)
 Renal duplex  has been completed. Refer to [facility] under chart review to view preliminary results.   11/13/2023  12:52 PM Jack [name], Ricka BIRCH

## 2023-11-13 NOTE — Progress Notes (Signed)
 Physical Therapy Treatment Patient Details Name: Jack [name] MRN: [id number] DOB: 23-Sep-1945 Today's Date: 11/13/2023   History of Present Illness 70M who was admitted for proximal descending thoracic aortic dissection.   PMH: CAD, HTN, CKD, asthma, and PAF    PT Comments  Pt admitted with above diagnosis. Pt able to progress ambulation with +2 assist today. Needs cues and assist but is making progress.  VSS.  Continue to recommend post acute rehab > 3 hours day. Pt motivated.  Wife supportive.  Pt currently with functional limitations due to the deficits listed below (see PT Problem List). Pt will benefit from acute skilled PT to increase their independence and safety with mobility to allow discharge.       If plan is discharge home, recommend the following: A lot of help with walking and/or transfers;A lot of help with bathing/dressing/bathroom;Assist for transportation;Help with stairs or ramp for entrance   Can travel by private vehicle        Equipment Recommendations   (TBD at next venue)    Recommendations for Other Services Rehab consult     Precautions / Restrictions Precautions Precautions: Fall Recall of Precautions/Restrictions: Impaired Precaution/Restrictions Comments: watch tremors, BP, NG tube Restrictions Weight Bearing Restrictions Per Provider Order: No     Mobility  Bed Mobility Overal bed mobility: Needs Assistance Bed Mobility: Supine to Sit, Sit to Supine     Supine to sit: Min assist Sit to supine: Min assist   General bed mobility comments: hob elevated and (A) to pivot to EOB with pad and R LE (A).    Transfers Overall transfer level: Needs assistance Equipment used:  Winfred walker) Transfers: Sit to/from Stand Sit to Stand: Mod assist, From elevated surface, +2 safety/equipment           General transfer comment: Able to stand to [facility] walker with assist and cues for hand placement.    Ambulation/Gait Ambulation/Gait assistance: Min  assist, +2 safety/equipment Gait Distance (Feet): 380 Feet Assistive device: Elyn Finder Gait Pattern/deviations: Step-through pattern, Decreased stride length, Knee flexed in stance - left, Drifts right/left, Trunk flexed   Gait velocity interpretation: <1.31 ft/sec, indicative of household ambulator   General Gait Details: Pt able to ambulate on unit with Elyn walker progressing distance with cues for posture as well as occasional steadying for left knee instability/weakness. followed pt with chair however he walked entire unit and waited to sit once back in room.   Stairs             Wheelchair Mobility     Tilt Bed    Modified Rankin (Stroke Patients Only)       Balance Overall balance assessment: Needs assistance Sitting-balance support: Feet supported, No upper extremity supported Sitting balance-Leahy Scale: Fair     Standing balance support: Reliant on assistive device for balance, Bilateral upper extremity supported, During functional activity Standing balance-Leahy Scale: Poor Standing balance comment: reliant on external support and Financial planner Communication: Impaired Factors Affecting Communication: Hearing impaired  Cognition Arousal: Alert Behavior During Therapy: Flat affect   PT - Cognitive impairments: Memory, Attention, Sequencing                         Following commands: Impaired Following commands impaired: Only follows one step commands consistently  Cueing Cueing Techniques: Verbal cues  Exercises General Exercises - Lower Extremity Ankle Circles/Pumps: AROM, Both, 10 reps, Supine Quad Sets: AROM, Both, 10 reps, Supine Long Arc Quad: AROM, Both, 10 reps, Seated    General Comments General comments (skin integrity, edema, etc.): 90 bpm, 91% RA, 117/77      Pertinent Vitals/Pain Pain Assessment Pain Assessment: No/denies pain    Home Living                           Prior Function            PT Goals (current goals can now be found in the care plan section) Acute Rehab PT Goals Patient Stated Goal: feel better Progress towards PT goals: Progressing toward goals    Frequency    Min 3X/week      PT Plan      Co-evaluation              AM-PAC PT 6 Clicks Mobility   Outcome Measure  Help needed turning from your back to your side while in a flat bed without using bedrails?: A Little Help needed moving from lying on your back to sitting on the side of a flat bed without using bedrails?: A Little Help needed moving to and from a bed to a chair (including a wheelchair)?: A Little Help needed standing up from a chair using your arms (e.g., wheelchair or bedside chair)?: A Lot Help needed to walk in hospital room?: Total Help needed climbing 3-5 steps with a railing? : A Lot 6 Click Score: 14    End of Session Equipment Utilized During Treatment: Gait belt Activity Tolerance: Patient limited by fatigue Patient left: with call bell/phone within reach;with family/visitor present;in chair;with chair alarm set Nurse Communication: Mobility status PT Visit Diagnosis: Unsteadiness on feet (R26.81);Muscle weakness (generalized) (M62.81);Difficulty in walking, not elsewhere classified (R26.2)     Time: 8967-8946 PT Time Calculation (min) (ACUTE ONLY): 21 min  Charges:    $Gait Training: 8-22 mins PT General Charges $$ ACUTE PT VISIT: 1 Visit                     Denai Caba M,PT Acute Rehab Services [phone]    Stephane JULIANNA Bevel 11/13/2023, 2:05 PM

## 2023-11-13 NOTE — Progress Notes (Addendum)
  Progress Note    11/13/2023 7:49 AM Hospital Day 4  Subjective:  says he feels better since NGT placed but nose and throat are sore.  Wife concerned about HR.   Afebrile HR 70's-90's 90's-120's systolic 95% 2LO2NC  Vitals:   11/13/23 0600 11/13/23 0700  BP: 119/83 123/71  Pulse: 92 91  Resp: (!) 24 10  Temp:    SpO2: 97% 95%    Physical Exam: General:  resting in no distress Lungs:  non labored  Extremities:  palpable bilateral radial pulses.  Bilateral feet are warm and well perfused.    CBC    Component Value Date/Time   WBC 7.8 11/13/2023 0348   RBC 4.62 11/13/2023 0348   HGB 14.0 11/13/2023 0348   HCT 39.6 11/13/2023 0348   PLT 223 11/13/2023 0348   MCV 85.7 11/13/2023 0348   MCH 30.3 11/13/2023 0348   MCHC 35.4 11/13/2023 0348   RDW 13.4 11/13/2023 0348   LYMPHSABS 2.3 11/08/2023 2111   MONOABS 1.2 (H) 11/08/2023 2111   EOSABS 0.3 11/08/2023 2111   BASOSABS 0.1 11/08/2023 2111    BMET    Component Value Date/Time   NA 129 (L) 11/13/2023 0348   K 4.0 11/13/2023 0348   CL 96 (L) 11/13/2023 0348   CO2 23 11/13/2023 0348   GLUCOSE 115 (H) 11/13/2023 0348   BUN 51 (H) 11/13/2023 0348   CREATININE 3.23 (H) 11/13/2023 0348   CALCIUM 8.9 11/13/2023 0348   GFRNONAA 19 (L) 11/13/2023 0348   GFRAA 56 (L) 12/24/2013 1316    INR    Component Value Date/Time   INR 1.0 11/09/2023 0845     Intake/Output Summary (Last 24 hours) at 11/13/2023 0749 Last data filed at 11/13/2023 0557 Gross per 24 hour  Intake 422.85 ml  Output 4375 ml  Net -3952.15 ml     Assessment/Plan:  78 y.o. male acute aortic syndrome either PAU versus IMH evaluated with noncontrast studies.   Hospital Day 4  -pt with vomiting overnight and found to have ileus.  NGT placed and has improved.  He continues to deny chest or back pain.   -renal function continues to worsen.  Will continue to hold on CTA as long as chest and back pain are resolved.  -pt with some afib overnight.  HR  in the 90's but BP is well controlled.    Lucie Apt, PA-C [facility] [phone] 11/13/2023 7:49 AM   VASCULAR STAFF ADDENDUM: I have independently interviewed and examined the patient. I agree with the above.  Ileus Tympanic abdomen No abdominal pain, chest pain, back pain Renal ultrasound completed to ensure there was no stenosis that could worsen renal function in the setting of decreased perfusion pressure as blood pressure is not managed.  Will continue to follow closely. Imaging once AKI resolves  Fonda FORBES Rim MD [facility] Phone Number: [phone] 11/13/2023 1:22 PM

## 2023-11-14 ENCOUNTER — Inpatient Hospital Stay (HOSPITAL_COMMUNITY): Admitting: Certified Registered"

## 2023-11-14 ENCOUNTER — Inpatient Hospital Stay (HOSPITAL_COMMUNITY)

## 2023-11-14 ENCOUNTER — Encounter (HOSPITAL_COMMUNITY): Payer: Self-pay | Admitting: Pulmonary Disease

## 2023-11-14 ENCOUNTER — Encounter (HOSPITAL_COMMUNITY)
Admission: EM | Disposition: A | Discharge status: Home Health | Payer: Self-pay | Source: Home / Self Care | Attending: Internal Medicine

## 2023-11-14 ENCOUNTER — Other Ambulatory Visit: Payer: Self-pay

## 2023-11-14 DIAGNOSIS — I728 Aneurysm of other specified arteries: Secondary | ICD-10-CM

## 2023-11-14 DIAGNOSIS — E785 Hyperlipidemia, unspecified: Secondary | ICD-10-CM | POA: Diagnosis not present

## 2023-11-14 DIAGNOSIS — I71019 Dissection of thoracic aorta, unspecified: Secondary | ICD-10-CM

## 2023-11-14 DIAGNOSIS — I161 Hypertensive emergency: Secondary | ICD-10-CM | POA: Diagnosis not present

## 2023-11-14 DIAGNOSIS — I2510 Atherosclerotic heart disease of native coronary artery without angina pectoris: Secondary | ICD-10-CM

## 2023-11-14 DIAGNOSIS — I700 Atherosclerosis of aorta: Secondary | ICD-10-CM

## 2023-11-14 DIAGNOSIS — I480 Paroxysmal atrial fibrillation: Secondary | ICD-10-CM | POA: Diagnosis not present

## 2023-11-14 DIAGNOSIS — N179 Acute kidney failure, unspecified: Secondary | ICD-10-CM

## 2023-11-14 DIAGNOSIS — Q254 Congenital malformation of aorta unspecified: Principal | ICD-10-CM | POA: Insufficient documentation

## 2023-11-14 DIAGNOSIS — I71012 Dissection of descending thoracic aorta: Secondary | ICD-10-CM

## 2023-11-14 DIAGNOSIS — I7409 Other arterial embolism and thrombosis of abdominal aorta: Secondary | ICD-10-CM

## 2023-11-14 DIAGNOSIS — Z87891 Personal history of nicotine dependence: Secondary | ICD-10-CM

## 2023-11-14 DIAGNOSIS — I10 Essential (primary) hypertension: Secondary | ICD-10-CM

## 2023-11-14 HISTORY — PX: THORACIC AORTIC ENDOVASCULAR STENT GRAFT: SHX6112

## 2023-11-14 LAB — CBC
HCT: 42.7 % (ref 39.0–52.0)
HCT: 40.2 % (ref 39.0–52.0)
Hemoglobin: 14.9 g/dL (ref 13.0–17.0)
Hemoglobin: 14.1 g/dL (ref 13.0–17.0)
MCH: 30.6 pg (ref 26.0–34.0)
MCH: 31.1 pg (ref 26.0–34.0)
MCHC: 34.9 g/dL (ref 30.0–36.0)
MCHC: 35.1 g/dL (ref 30.0–36.0)
MCV: 87.7 fL (ref 80.0–100.0)
MCV: 88.5 fL (ref 80.0–100.0)
Platelets: 250 10*3/uL (ref 150–400)
Platelets: 229 K/uL (ref 150–400)
RBC: 4.87 MIL/uL (ref 4.22–5.81)
RBC: 4.54 MIL/uL (ref 4.22–5.81)
RDW: 13.5 % (ref 11.5–15.5)
RDW: 13.6 % (ref 11.5–15.5)
WBC: 5.7 10*3/uL (ref 4.0–10.5)
WBC: 6.7 K/uL (ref 4.0–10.5)
nRBC: 0 % (ref 0.0–0.2)
nRBC: 0 % (ref 0.0–0.2)

## 2023-11-14 LAB — BASIC METABOLIC PANEL WITH GFR
Anion gap: 17 — ABNORMAL HIGH (ref 5–15)
Anion gap: 18 — ABNORMAL HIGH (ref 5–15)
BUN: 58 mg/dL — ABNORMAL HIGH (ref 8–23)
BUN: 58 mg/dL — ABNORMAL HIGH (ref 8–23)
CO2: 19 mmol/L — ABNORMAL LOW (ref 22–32)
CO2: 22 mmol/L (ref 22–32)
Calcium: 9.1 mg/dL (ref 8.9–10.3)
Calcium: 8.7 mg/dL — ABNORMAL LOW (ref 8.9–10.3)
Chloride: 98 mmol/L (ref 98–111)
Chloride: 97 mmol/L — ABNORMAL LOW (ref 98–111)
Creatinine, Ser: 3.08 mg/dL — ABNORMAL HIGH (ref 0.61–1.24)
Creatinine, Ser: 3.02 mg/dL — ABNORMAL HIGH (ref 0.61–1.24)
GFR, Estimated: 20 mL/min — ABNORMAL LOW (ref >60)
GFR, Estimated: 21 mL/min — ABNORMAL LOW (ref >60)
Glucose, Bld: 90 mg/dL (ref 70–99)
Glucose, Bld: 118 mg/dL — ABNORMAL HIGH (ref 70–99)
Potassium: 4.1 mmol/L (ref 3.5–5.1)
Potassium: 3.8 mmol/L (ref 3.5–5.1)
Sodium: 134 mmol/L — ABNORMAL LOW (ref 135–145)
Sodium: 137 mmol/L (ref 135–145)

## 2023-11-14 LAB — TYPE AND SCREEN
ABO/RH(D): A NEG
Antibody Screen: NEGATIVE

## 2023-11-14 LAB — PROTIME-INR
INR: 1.2 (ref 0.8–1.2)
Prothrombin Time: 15.5 s — ABNORMAL HIGH (ref 11.4–15.2)

## 2023-11-14 LAB — PHOSPHORUS: Phosphorus: 5.8 mg/dL — ABNORMAL HIGH (ref 2.5–4.6)

## 2023-11-14 LAB — APTT: aPTT: 27 s (ref 24–36)

## 2023-11-14 LAB — GLUCOSE, CAPILLARY
Glucose-Capillary: 108 mg/dL — ABNORMAL HIGH (ref 70–99)
Glucose-Capillary: 110 mg/dL — ABNORMAL HIGH (ref 70–99)

## 2023-11-14 LAB — MAGNESIUM
Magnesium: 2.4 mg/dL (ref 1.7–2.4)
Magnesium: 2.2 mg/dL (ref 1.7–2.4)

## 2023-11-14 LAB — TROPONIN I (HIGH SENSITIVITY)
Troponin I (High Sensitivity): 22 ng/L — ABNORMAL HIGH (ref <18)
Troponin I (High Sensitivity): 24 ng/L — ABNORMAL HIGH (ref <18)

## 2023-11-14 SURGERY — INSERTION, ENDOVASCULAR STENT GRAFT, AORTA, THORACIC
Anesthesia: General

## 2023-11-14 MED ORDER — ASPIRIN 81 MG PO TBEC
81.0000 mg | DELAYED_RELEASE_TABLET | Freq: Every day | ORAL | Status: DC
  Administered 2023-11-15 – 2023-11-20 (×5): 81 mg via ORAL
  Filled 2023-11-14 (×4): qty 1

## 2023-11-14 MED ORDER — PHENYLEPHRINE HCL-NACL 20-0.9 MG/250ML-% IV SOLN
INTRAVENOUS | Status: DC | PRN
  Administered 2023-11-14: 30 ug/min via INTRAVENOUS

## 2023-11-14 MED ORDER — CEFAZOLIN SODIUM-DEXTROSE 2-4 GM/100ML-% IV SOLN
INTRAVENOUS | Status: AC
  Filled 2023-11-14: qty 100

## 2023-11-14 MED ORDER — HEPARIN 6000 UNIT IRRIGATION SOLUTION
Status: AC
  Filled 2023-11-14: qty 500

## 2023-11-14 MED ORDER — FENTANYL CITRATE (PF) 250 MCG/5ML IJ SOLN
INTRAMUSCULAR | Status: AC
  Filled 2023-11-14: qty 5

## 2023-11-14 MED ORDER — IODIXANOL 320 MG/ML IV SOLN
INTRAVENOUS | Status: DC | PRN
Start: 2023-11-14 — End: 2023-11-14
  Administered 2023-11-14: 32.13 mL via INTRA_ARTERIAL

## 2023-11-14 MED ORDER — ROCURONIUM BROMIDE 10 MG/ML (PF) SYRINGE
PREFILLED_SYRINGE | INTRAVENOUS | Status: DC | PRN
  Administered 2023-11-14 (×2): 50 mg via INTRAVENOUS

## 2023-11-14 MED ORDER — LIDOCAINE HCL (PF) 1 % IJ SOLN
5.0000 mL | Freq: Once | INTRAMUSCULAR | Status: AC
  Administered 2023-11-14: 5 mL
  Filled 2023-11-14: qty 5

## 2023-11-14 MED ORDER — LIDOCAINE 2% (20 MG/ML) 5 ML SYRINGE
INTRAMUSCULAR | Status: AC
  Filled 2023-11-14: qty 5

## 2023-11-14 MED ORDER — ONDANSETRON HCL 4 MG/2ML IJ SOLN
INTRAMUSCULAR | Status: DC | PRN
  Administered 2023-11-14 (×2): 4 mg via INTRAVENOUS

## 2023-11-14 MED ORDER — PROPOFOL 10 MG/ML IV BOLUS
INTRAVENOUS | Status: DC | PRN
  Administered 2023-11-14: 120 mg via INTRAVENOUS

## 2023-11-14 MED ORDER — SODIUM CHLORIDE 0.9 % IV SOLN: 500.0000 mL | Freq: Once | INTRAVENOUS | Status: DC | PRN

## 2023-11-14 MED ORDER — CHLORHEXIDINE GLUCONATE 0.12 % MT SOLN: 15.0000 mL | Freq: Once | OROMUCOSAL | Status: AC

## 2023-11-14 MED ORDER — ONDANSETRON HCL 4 MG/2ML IJ SOLN: 4.0000 mg | Freq: Four times a day (QID) | INTRAMUSCULAR | Status: DC | PRN

## 2023-11-14 MED ORDER — HEPARIN 6000 UNIT IRRIGATION SOLUTION
Status: DC | PRN
  Administered 2023-11-14: 1

## 2023-11-14 MED ORDER — SODIUM CHLORIDE 0.9 % IV SOLN: INTRAVENOUS | Status: AC

## 2023-11-14 MED ORDER — LABETALOL HCL 5 MG/ML IV SOLN
INTRAVENOUS | Status: DC | PRN
  Administered 2023-11-14: 5 mg via INTRAVENOUS

## 2023-11-14 MED ORDER — PROPOFOL 10 MG/ML IV BOLUS
INTRAVENOUS | Status: AC
  Filled 2023-11-14: qty 20

## 2023-11-14 MED ORDER — CLEVIDIPINE BUTYRATE 0.5 MG/ML IV EMUL
INTRAVENOUS | Status: AC
  Filled 2023-11-14: qty 50

## 2023-11-14 MED ORDER — CEFAZOLIN SODIUM-DEXTROSE 2-4 GM/100ML-% IV SOLN: 2.0000 g | Freq: Three times a day (TID) | INTRAVENOUS | Status: DC

## 2023-11-14 MED ORDER — 0.9 % SODIUM CHLORIDE (POUR BTL) OPTIME
TOPICAL | Status: DC | PRN
  Administered 2023-11-14: 1000 mL

## 2023-11-14 MED ORDER — ORAL CARE MOUTH RINSE: 15.0000 mL | Freq: Once | OROMUCOSAL | Status: AC

## 2023-11-14 MED ORDER — SODIUM CHLORIDE 0.9 % IV SOLN: INTRAVENOUS | Status: DC | PRN

## 2023-11-14 MED ORDER — HEPARIN SODIUM (PORCINE) 1000 UNIT/ML IJ SOLN
INTRAMUSCULAR | Status: DC | PRN
  Administered 2023-11-14: 7000 [IU] via INTRAVENOUS
  Administered 2023-11-14: 2000 [IU] via INTRAVENOUS

## 2023-11-14 MED ORDER — ROCURONIUM BROMIDE 10 MG/ML (PF) SYRINGE
PREFILLED_SYRINGE | INTRAVENOUS | Status: AC
  Filled 2023-11-14: qty 10

## 2023-11-14 MED ORDER — PROTAMINE SULFATE 10 MG/ML IV SOLN
INTRAVENOUS | Status: AC
  Filled 2023-11-14: qty 5

## 2023-11-14 MED ORDER — CHLORHEXIDINE GLUCONATE 0.12 % MT SOLN
OROMUCOSAL | Status: AC
  Administered 2023-11-14: 15 mL via OROMUCOSAL
  Filled 2023-11-14: qty 15

## 2023-11-14 MED ORDER — LIDOCAINE 2% (20 MG/ML) 5 ML SYRINGE
INTRAMUSCULAR | Status: DC | PRN
  Administered 2023-11-14: 60 mg via INTRAVENOUS

## 2023-11-14 MED ORDER — CEFAZOLIN SODIUM-DEXTROSE 1-4 GM/50ML-% IV SOLN: 1.0000 g | INTRAVENOUS | Status: DC

## 2023-11-14 MED ORDER — POTASSIUM CHLORIDE CRYS ER 20 MEQ PO TBCR: 20.0000 meq | EXTENDED_RELEASE_TABLET | Freq: Every day | ORAL | Status: DC | PRN

## 2023-11-14 MED ORDER — CEFAZOLIN SODIUM-DEXTROSE 2-3 GM-%(50ML) IV SOLR
INTRAVENOUS | Status: DC | PRN
  Administered 2023-11-14: 2 g via INTRAVENOUS

## 2023-11-14 MED ORDER — HEPARIN SODIUM (PORCINE) 1000 UNIT/ML IJ SOLN
INTRAMUSCULAR | Status: AC
  Filled 2023-11-14: qty 10

## 2023-11-14 MED ORDER — LACTATED RINGERS IV SOLN: INTRAVENOUS | Status: DC

## 2023-11-14 MED ORDER — MAGNESIUM SULFATE 2 GM/50ML IV SOLN: 2.0000 g | Freq: Every day | INTRAVENOUS | Status: DC | PRN

## 2023-11-14 MED ORDER — CEFAZOLIN SODIUM-DEXTROSE 2-4 GM/100ML-% IV SOLN
2.0000 g | Freq: Two times a day (BID) | INTRAVENOUS | Status: AC
  Administered 2023-11-14: 2 g via INTRAVENOUS
  Filled 2023-11-14: qty 100

## 2023-11-14 MED ORDER — FENTANYL CITRATE (PF) 100 MCG/2ML IJ SOLN: 25.0000 ug | INTRAMUSCULAR | Status: DC | PRN

## 2023-11-14 MED ORDER — FENTANYL CITRATE (PF) 250 MCG/5ML IJ SOLN
INTRAMUSCULAR | Status: DC | PRN
  Administered 2023-11-14: 50 ug via INTRAVENOUS
  Administered 2023-11-14: 100 ug via INTRAVENOUS

## 2023-11-14 MED ORDER — SUGAMMADEX SODIUM 200 MG/2ML IV SOLN
INTRAVENOUS | Status: DC | PRN
  Administered 2023-11-14: 200 mg via INTRAVENOUS

## 2023-11-14 MED ORDER — SODIUM CHLORIDE 0.9 % IV SOLN: INTRAVENOUS | Status: AC | PRN

## 2023-11-14 MED ORDER — PROTAMINE SULFATE 10 MG/ML IV SOLN
INTRAVENOUS | Status: DC | PRN
  Administered 2023-11-14: 50 mg via INTRAVENOUS

## 2023-11-14 SURGICAL SUPPLY — 38 items
BAG COUNTER SPONGE SURGICOUNT (BAG) ×1 IMPLANT
BENZOIN TINCTURE PRP APPL 2/3 (GAUZE/BANDAGES/DRESSINGS) ×1 IMPLANT
CANISTER SUCTION 3000ML PPV (SUCTIONS) ×1 IMPLANT
CATH ACCU-VU SIZ PIG 5F 100CM (CATHETERS) ×1 IMPLANT
CATH VISIONS PV .035 IVUS (CATHETERS) IMPLANT
CLSR STERI-STRIP ANTIMIC 1/2X4 (GAUZE/BANDAGES/DRESSINGS) IMPLANT
DEVICE CLOSURE PERCLS PRGLD 6F (VASCULAR PRODUCTS) ×2 IMPLANT
DRSG TEGADERM 2-3/8X2-3/4 SM (GAUZE/BANDAGES/DRESSINGS) ×1 IMPLANT
ELECT CAUTERY BLADE 6.4 (BLADE) ×1 IMPLANT
ELECTRODE REM PT RTRN 9FT ADLT (ELECTROSURGICAL) ×2 IMPLANT
GLOVE BIO SURGEON STRL SZ8 (GLOVE) ×1 IMPLANT
GOWN STRL REUS W/ TWL LRG LVL3 (GOWN DISPOSABLE) ×2 IMPLANT
GOWN STRL REUS W/ TWL XL LVL3 (GOWN DISPOSABLE) ×1 IMPLANT
GRAFT BALLN CATH 65CM (BALLOONS) IMPLANT
KIT BASIN OR (CUSTOM PROCEDURE TRAY) ×1 IMPLANT
KIT ENCORE 26 ADVANTAGE (KITS) IMPLANT
KIT TURNOVER KIT B (KITS) ×1 IMPLANT
NS IRRIG 1000ML POUR BTL (IV SOLUTION) ×1 IMPLANT
PACK ENDOVASCULAR (PACKS) ×1 IMPLANT
PAD ARMBOARD POSITIONER FOAM (MISCELLANEOUS) ×2 IMPLANT
PENCIL BUTTON HOLSTER BLD 10FT (ELECTRODE) ×1 IMPLANT
SET MICROPUNCTURE 5F STIFF (MISCELLANEOUS) IMPLANT
SHEATH AVANTI 11CM 8FR (SHEATH) ×1 IMPLANT
SHEATH DRYSEAL FLEX 22FR 33CM (SHEATH) IMPLANT
SHEATH PINNACLE 8F 10CM (SHEATH) IMPLANT
SLEEVE ISOL F/PACE RF HD COVER (MISCELLANEOUS) IMPLANT
STENT GRFT THORAC ACS 31X31X10 (Endovascular Graft) IMPLANT
STENT GRFT THORAC ACS 31X31X20 (Endovascular Graft) IMPLANT
STOPCOCK MORSE 400PSI 3WAY (MISCELLANEOUS) ×1 IMPLANT
STRIP CLOSURE SKIN 1/2X4 (GAUZE/BANDAGES/DRESSINGS) ×2 IMPLANT
SUT MNCRL AB 4-0 PS2 18 (SUTURE) IMPLANT
SYR 30ML LL (SYRINGE) IMPLANT
SYR 50ML LL SCALE MARK (SYRINGE) ×1 IMPLANT
TOWEL GREEN STERILE (TOWEL DISPOSABLE) ×1 IMPLANT
TUBING HIGH PRESSURE 120CM (CONNECTOR) ×1 IMPLANT
TUBING INJECTOR 48 (MISCELLANEOUS) IMPLANT
WIRE BENTSON .035X145CM (WIRE) ×1 IMPLANT
WIRE STIFF LUNDERQUIST 260CM (WIRE) ×1 IMPLANT

## 2023-11-14 NOTE — Progress Notes (Addendum)
 Progress Note    11/14/2023 6:38 AM Hospital Day 5  Subjective:  had chest pain this morning at 5am.  Still nauseated and was most of the day yesterday.   Afebrile HR 70's-90's NSR 100's-120's systolic 95% 2LO2NC  Gtts:  labetalol   Vitals:   11/14/23 0445 11/14/23 0500  BP: 126/85 129/87  Pulse: 80 84  Resp: 12 14  Temp:    SpO2: (!) 89% 91%    Physical Exam: General:  does not look comfortable this morning Lungs:  non labored Abdomen: distended    CBC    Component Value Date/Time   WBC 5.7 11/14/2023 0518   RBC 4.87 11/14/2023 0518   HGB 14.9 11/14/2023 0518   HCT 42.7 11/14/2023 0518   PLT 250 11/14/2023 0518   MCV 87.7 11/14/2023 0518   MCH 30.6 11/14/2023 0518   MCHC 34.9 11/14/2023 0518   RDW 13.5 11/14/2023 0518   LYMPHSABS 2.3 11/08/2023 2111   MONOABS 1.2 (H) 11/08/2023 2111   EOSABS 0.3 11/08/2023 2111   BASOSABS 0.1 11/08/2023 2111    BMET    Component Value Date/Time   NA 134 (L) 11/14/2023 0301   K 4.1 11/14/2023 0301   CL 98 11/14/2023 0301   CO2 19 (L) 11/14/2023 0301   GLUCOSE 90 11/14/2023 0301   BUN 58 (H) 11/14/2023 0301   CREATININE 3.08 (H) 11/14/2023 0301   CALCIUM 9.1 11/14/2023 0301   GFRNONAA 20 (L) 11/14/2023 0301   GFRAA 56 (L) 12/24/2013 1316    INR    Component Value Date/Time   INR 1.0 11/09/2023 0845     Intake/Output Summary (Last 24 hours) at 11/14/2023 9361 Last data filed at 11/14/2023 9491 Gross per 24 hour  Intake 238.31 ml  Output 950 ml  Net -711.69 ml     Assessment/Plan:  78 y.o. male with acute aortic syndrome either PAU versus IMH evaluated with noncontrast studies.   Hospital Day 5  -CT scan this morning after having chest pain.  Reviewed by Dr. Lanis and more concerning than original CT.  Plan for OR today for TEVAR.  Hopeful to be able to use minimal contrast using IVUS.  Dr. Lanis discussed with pt risk of stroke, and renal failure.  Also discussed is not repaired, it could result in  death.  Pt and wife expressed understanding.  -AKI-creatinine slightly improved from yesterday at 3.08.    Lucie Apt, PA-C [facility] [phone] 11/14/2023 6:38 AM  VASCULAR STAFF ADDENDUM: I have independently interviewed and examined the patient. I agree with the above.  I reviewed the patient's noncontrast CT.  There have interval change in the descending thoracic aorta.  There is some stranding, as well as fluid surrounding the distal aorta at the level of the diaphragm.  This interval change with new onset chest pain warrants repair.  I am in the office today, therefore discussed it with my partner Dr. Magda who graciously added the patient on to his schedule.  He will be the next case to go to the operating room. I had a long discussion with Welles and his wife regarding the above, most notably that continued degeneration of the lesion could lead to rupture.  Being that he is symptomatic, this needs to be covered.  He is aware that we will try to limit contrast as much as possible but that any intervention could further decrease kidney function to the point of needing dialysis.  He is also at risk for stroke due  to the severe atherosclerotic disease in the aortic arch.  At the time of my visit, he denied chest pain, back pain, abdominal pain.  Notable tympany in the abdomen, NG tube to suction.  After discussing the risks and benefits of the above, Adeline and his wife elected to proceed.   Fonda FORBES Rim MD [facility] Phone Number: [phone] 11/14/2023 8:05 AM

## 2023-11-14 NOTE — TOC Progression Note (Signed)
 Transition of Care [facility]) - Progression Note    Patient Details  Name: Jack [name] MRN: [id number] Date of Birth: 1945-11-02  [facility]) CM/SW Contact  Justina Delcia Czar, RN Phone Number: [phone] 11/14/2023, 8:03 PM  Clinical Narrative:    Spoke to pt and wife at bedside.  Chart reviewed for dc readiness.  Will need PT/OT recommendations. CIR for IP rehab.  Pt was independent pta. No DME or HH prior to admission.  Will continue to follow for dc needs.    Expected Discharge Plan: Home/Self Care Barriers to Discharge: Continued Medical Work up  Expected Discharge Plan and Services       Living arrangements for the past 2 months: Single Family Home                                       Social Determinants of Health (SDOH) Interventions SDOH Screenings   Food Insecurity: No Food Insecurity (11/10/2023)  Housing: Low Risk  (11/10/2023)  Transportation Needs: No Transportation Needs (11/10/2023)  Utilities: Not At Risk (11/10/2023)  Social Connections: Unknown (11/11/2023)  Tobacco Use: Medium Risk (11/14/2023)    Readmission Risk Interventions     No data to display

## 2023-11-14 NOTE — Plan of Care (Signed)

## 2023-11-14 NOTE — Progress Notes (Signed)
   Inpatient Rehabilitation Admissions Coordinator   Noted plans for TEVAR. We will follow up postop to assist with planning rehab venues needs when appropriate.  Heron Leavell, RN, MSN Rehab Admissions Coordinator [phone] 11/14/2023 11:05 AM

## 2023-11-14 NOTE — Progress Notes (Signed)
 C/o chest discomfort and left upper arm pain 4/10 and is not radiating. HR up to 140s afibb. EKG done. In and out of Afibb intermittently.   Notified Elink. Troponin being sent to lab.

## 2023-11-14 NOTE — Op Note (Signed)
 DATE OF SERVICE: 11/14/2023  PATIENT:  Jack [name]  78 y.o. male  PRE-OPERATIVE DIAGNOSIS:  contained rupture of descending thoracic intramural hematoma  POST-OPERATIVE DIAGNOSIS:  Same  PROCEDURE:   1) ultrasound guided right common femoral artery access and large bore closure (CPT [phone]) 2) intravascular ultrasound of aortic arch, descending thoracic aorta, abdominal aorta (CPT 37252, 37253x2) 3) thoracic endovascular aortic repair (CPT [phone])  4) extension of TEVAR proximally (CPT [phone]) 5) radiologic supervision and interpretation (CPT [phone])   SURGEON:  Surgeons and Role:    * Magda Debby SAILOR, MD - Primary  ASSISTANT: Lucie Apt, PA-C  An experienced assistant was required given the complexity of this procedure and the standard of surgical care. My assistant helped with exposure through counter tension, suctioning, ligation and retraction to better visualize the surgical field.  My assistant expedited sewing during the case by following my sutures. Wherever I use the term we in the report, my assistant actively helped me with that portion of the procedure.  ANESTHESIA:   general  EBL: minimal  BLOOD ADMINISTERED:none  DRAINS: none   LOCAL MEDICATIONS USED:  NONE  SPECIMEN:  none  COUNTS: confirmed correct.  TOURNIQUET:  none  PATIENT DISPOSITION:  PACU - hemodynamically stable.   Delay start of Pharmacological VTE agent (>24hrs) due to surgical blood loss or risk of bleeding: no  INDICATION FOR PROCEDURE: Jack [name] is a 78 y.o. male with contained rupture of descending thoracic aorta. After careful discussion of risks, benefits, and alternatives the patient was offered TEVAR. We specifically discussed risk of stroke and spinal cord ischemia. The patient understood and wished to proceed.  OPERATIVE FINDINGS: IVUS shows irregularity of mid-descending thoracic aorta; supraceliac aneurysm as predicted. Successful TEVAR.   ENDOPROSTHESIS USED: 31 x 200 mm  Gore C tag TEVAR device above celiac 31 x 100 cm Gore C tag TEVAR device side and above first graft  DESCRIPTION OF PROCEDURE: After identification of the patient in the pre-operative holding area, the patient was transferred to the operating room. The patient was positioned supine on the operating room table. Anesthesia was induced. The chest, abdomen, pelvis was prepped and draped in standard fashion. A surgical pause was performed confirming correct patient, procedure, and operative location.  Duplex ultrasound was used to guide access in the right common femoral artery.  Seldinger technique was used to introduce a 018 wire into the external iliac artery.  Micro sheath was introduced over the wire after removing the needle.  A Bentson wire was navigated into the aorta.  Over the wire the tract was dilated with the dilator of an 8 [place] sheath.  The skin around the dilator was incised with an 11 blade.  The tract was dilated with a hemostat.  Perclose sutures were placed at 10:00 and 2:00 on the arteriotomy and secured with rubber braided shod clamps for use at the end of the case.  The 8 French sheath was then introduced in the common femoral artery.  Intravascular ultrasound was performed of the arch of the aorta, descending thoracic aorta, and abdominal aorta.  Recording was made.  Size of the healthy descending thoracic aorta and supraceliac aorta was made with IVUS assistance.  The wire was exchanged for a double curved Lunderquist wire.  The access was upsized to a 22 [place] dry seal sheath.  A steep left lateral projection was obtained.  Angiogram was performed and the origin of the celiac artery defined.  First endoprosthesis was positioned just above  the celiac origin and deployed in standard fashion under fluoroscopic guidance.  Good result was achieved.  I reimaged the segment above the graft and found some irregularity, and elected to cover this for an additional 5 cm.  A 31 x 100 cm  extension piece was delivered over the wire and deployed to cover this lesion extending into the existing endoprosthesis.  Good result was achieved on intravascular ultrasound.  Completion angiogram showed good technical result with seal of the affected area in the descending thoracic aorta and preservation of forward flow into the celiac artery.  All endovascular equipment was removed.  The Perclose sutures were secured with good hemostasis.  Doppler flow was confirmed in the foot.  The sutures were secured and cut.  The groin was closed with Bovie electrocautery.  Upon completion of the case instrument and sharps counts were confirmed correct. The patient was transferred to the PACU in good condition. I was present for all portions of the procedure.  FOLLOW UP PLAN: Return ICU.  Follow-up with me in 4 weeks with CT angiogram if renal function permits.  Debby SAILOR. Magda, MD [facility] Phone Number: [phone] 11/14/2023 12:14 PM

## 2023-11-14 NOTE — Procedures (Signed)
 Arterial Catheter Insertion Procedure Note  Jack [name]  [id number]  Aug 05, 1945  Date:11/14/23  Time:8:51 AM    Provider Performing: Corean CHRISTELLA Leigh Blas    Procedure: Insertion of Arterial Line (63379) with US  guidance (23062)   Indication(s): Blood pressure monitoring and/or need for frequent ABGs  Consent: Consent obtained verbally from patient/wife at bedside due to urgent nature of procedure prior to OR.  Anesthesia: Local with lidocaine  1%  Time Out: Verified patient identification, verified procedure, site/side was marked, verified correct patient position, special equipment/implants available, medications/allergies/relevant history reviewed, required imaging and test results available.  Sterile Technique: Maximal sterile technique including full sterile barrier drape, hand hygiene, sterile gown, sterile gloves, mask, hair covering, sterile ultrasound probe cover (if used).  Procedure Description: Area of catheter insertion was cleaned with chlorhexidine  and draped in sterile fashion. With real-time ultrasound guidance an arterial catheter was placed into the left radial artery. 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 Ulnar anatomy on contralateral side of L wrist also confirmed. Line was placed in deep radial artery. Appropriate arterial tracings confirmed on monitor.    Complications/Tolerance: None; patient tolerated the procedure well.  EBL: Minimal  Specimen(s): None  Corean CHRISTELLA Adaley Kiene, [place] [facility] 11/14/23 8:52 AM  Please see [email or web address] for pager details.  From 7A-7P if no response, please call [phone] After hours, please call ELink [phone]

## 2023-11-14 NOTE — Progress Notes (Signed)
 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  From 7A-7P if no response, please call [phone] After hours, please call ELink [phone]

## 2023-11-14 NOTE — Progress Notes (Addendum)
 Patient ID: CAULDER WEHNER, male   DOB: December 26, 1945, 78 y.o.   MRN: [id number] S: Had chest pain last night but currently resolved.  Main complaint is nausea this morning.   O:BP 110/69   Pulse 79   Temp 98.6 F (37 C) (Oral)   Resp 16   Ht 5' 10 (1.778 m)   Wt 52.9 kg   SpO2 94%   BMI 16.73 kg/m   Intake/Output Summary (Last 24 hours) at 11/14/2023 0742 Last data filed at 11/14/2023 0700 Gross per 24 hour  Intake 238.31 ml  Output 1400 ml  Net -1161.69 ml   Intake/Output: I/O last 3 completed shifts: In: 238.3 [I.V.:140.7; IV Piggyback:97.6] Out: 4450 [Urine:2200; Emesis/NG output:2250]  Intake/Output this shift:  No intake/output data recorded. Weight change:  Gen: appears uncomfortable lying in bed with NGT CVS: RRR Resp:CTA Jai:ipduzwizi, hypoactive BS, mildly tender to palpation Ext: no edema/cyanosis  Recent Labs  Lab 11/08/23 2111 11/10/23 0226 11/11/23 0238 11/11/23 1101 11/12/23 0311 11/13/23 0348 11/14/23 0301  NA 140 134* 131*  --  127* 129* 134*  K 3.8 4.2 4.8  --  5.4* 4.0 4.1  CL 103 108 105  --  103 96* 98  CO2 25 21* 15*  --  14* 23 19*  GLUCOSE 118* 113* 103*  --  110* 115* 90  BUN 33* 31* 35*  --  43* 51* 58*  CREATININE 1.90* 2.37* 2.58*  --  2.86* 3.23* 3.08*  ALBUMIN 3.8  --   --  2.6*  --   --   --   CALCIUM 9.7 8.2* 8.4*  --  8.5* 8.9 9.1  PHOS  --  3.2  --   --  4.8* 6.8* 5.8*  AST 25  --   --  14*  --   --   --   ALT 20  --   --  14  --   --   --    Liver Function Tests: Recent Labs  Lab 11/08/23 2111 11/11/23 1101  AST 25 14*  ALT 20 14  ALKPHOS 66 55  BILITOT 0.3 0.9  PROT 6.7 5.3*  ALBUMIN 3.8 2.6*   Recent Labs  Lab 11/08/23 2111  LIPASE 38   Recent Labs  Lab 11/11/23 1101  AMMONIA 25   CBC: Recent Labs  Lab 11/08/23 2111 11/10/23 0226 11/11/23 0238 11/12/23 0311 11/13/23 0348 11/14/23 0518  WBC 8.5 10.1 10.1 10.9* 7.8 5.7  NEUTROABS 4.5  --   --   --   --   --   HGB 16.2 14.2 13.8 12.6* 14.0 14.9  HCT  47.3 41.5 41.5 36.2* 39.6 42.7  MCV 90.4 89.4 92.2 88.1 85.7 87.7  PLT 251 217 180 184 223 250   Cardiac Enzymes: No results for input(s): CKTOTAL, CKMB, CKMBINDEX, TROPONINI in the last 168 hours. CBG: Recent Labs  Lab 11/09/23 1414 11/10/23 0854 11/12/23 2312  GLUCAP 102* 125* 141*    Iron Studies: No results for input(s): IRON, TIBC, TRANSFERRIN, FERRITIN in the last 72 hours. Studies/Results: CT CHEST ABDOMEN PELVIS WO CONTRAST Result Date: 11/14/2023 CLINICAL DATA:  Follow-up of previous finding of contained perforation or intramural hematoma along the posterolateral aspect of the proximal descending aorta. EXAM: CT CHEST, ABDOMEN AND PELVIS WITHOUT CONTRAST TECHNIQUE: Multidetector CT imaging of the chest, abdomen and pelvis was performed following the standard protocol without IV contrast. RADIATION DOSE REDUCTION: This exam was performed according to the departmental dose-optimization program which includes automated exposure control,  adjustment of the mA and/or kV according to patient size and/or use of iterative reconstruction technique. COMPARISON:  Chest CT without contrast as well as MRA thorax and abdomen both 5 days ago 11/09/2023. CT abdomen pelvis with no contrast 01/22/2017. FINDINGS: CT CHEST FINDINGS Cardiovascular: The heart is slightly enlarged. The coronary arteries and aorta are heavily calcified. There is aortic tortuosity, patchy calcification in the great vessels. At the posterolateral wall of the proximal descending aorta, again noted is a subtle, possible extraluminal density or intraluminal hematoma, on 2:26 estimated 2.5 x 0.9 cm, was previously 2 x 1 cm and again could be a contained perforation or mural hematoma. It is less dense than previously, was previously 55 Hounsfield units now measuring 33. There is stable aneurysmal dilatation in distal descending aorta, at the hiatal level is 4.2 cm AP and 4.3 cm transverse. Aortic dissection is not  assessed without contrast. The pulmonary arteries and veins are normal caliber. There is no pericardial effusion. Mediastinum/Nodes: NGT enters the stomach with the side hole in the distal esophagus and needs to be advanced further in 10 cm. Thoracic trachea main bronchi are unremarkable. Thyroid  gland and axillary spaces are unremarkable. No intrathoracic adenopathy is seen. Lungs/Pleura: 5 mm right middle lobe nodule again noted on 3:85. No follow-up imaging recommended. There is increased consolidation with air bronchograms the posteromedial left lower lobe consistent with pneumonia or aspiration. There are increased small layering pleural effusions, slightly more so on the left. No pleural hemorrhage is seen. There are mild paraseptal emphysematous changes in the upper lung apices. Increased posterior subsegmental atelectasis right lower lobe. Rest of lungs are generally clear.  No pneumothorax. Musculoskeletal: No chest wall mass or suspicious bone lesions identified. CT ABDOMEN PELVIS FINDINGS Hepatobiliary: No focal liver abnormality is seen. No gallstones, gallbladder wall thickening, or biliary dilatation. Pancreas: Partially atrophic.  Otherwise unremarkable. Spleen: Normal in size without focal abnormality. Adrenals/Urinary Tract: Severe chronic right hydronephrosis advanced cortical atrophy consistent with chronic UPJ obstruction. There is no adrenal mass, no contour deforming abnormality of the left kidney apart from a Bosniak 1 3.6 cm cyst in the inferior pole, Hounsfield density is 1. There is minimal left perinephric fluid. There is no urinary stone or obstruction. The bladder is catheterized, contracted and not well seen. Stomach/Bowel: Portions of the anterior abdomen were excluded from the field of view. There is fluid dilatation of the stomach and small bowel up to 4.2 cm small-bowel caliber, continues down to the terminal ileal segment. There is no discrete transition. Findings could be due to  ileus or mechanical obstruction at the ileocecal junction. There is moderate retained stool in the ascending colon. Uncomplicated sigmoid diverticulosis. No wall thickening or inflammatory change. Vascular/Lymphatic: Moderate to heavy aortoiliac calcific plaque with branch vessel atherosclerosis. Maximum infrarenal aortic caliber 2.8 cm. No adenopathy is seen. Reproductive: Enlarged prostate, 5.6 cm transverse. Other: The excluded portion of the anterior abdomen includes the area of the umbilicus. An umbilical hernia would be missed. There are small bilateral inguinal fat hernias. There is a small volume of pelvic ascites. No drainable pocket. No free air. Musculoskeletal: No acute or significant osseous findings. IMPRESSION: 1. 2.5 x 0.9 cm subtle, possible extraluminal density such as a contained perforation, or intraluminal hematoma along the posterolateral wall of the proximal descending aorta, previously 2 x 1 cm. It is less dense than previously. 2. Stable aneurysmal dilatation of the distal descending aorta, 4.2 x 4.3 cm. 3. Increased consolidation in the posteromedial left lower lobe  consistent with pneumonia or aspiration. 4. Increased small layering pleural effusions, slightly more so on the left. No pleural hemorrhage is seen. 5. NGT side hole in the distal esophagus and needs to be advanced further in 10 cm. 6. Emphysema. 7. Aortic and coronary artery atherosclerosis. 8. Fluid dilatation of the stomach and small bowel down to the terminal ileal segment without discrete transition. Findings could be due to ileus or mechanical obstruction at the ileocecal junction. The umbilicus was not included in the scan and an umbilical hernia would be missed. 9. Small volume of pelvic ascites. 10. Chronic right UPJ obstruction with severe hydronephrosis and advanced cortical atrophy. 11. Prostatomegaly. 12. Small bilateral inguinal fat hernias. Aortic Atherosclerosis (ICD10-I70.0) and Emphysema (ICD10-J43.9).  Electronically Signed   By: Francis Quam M.D.   On: 11/14/2023 07:17   VAS US  RENAL ARTERY DUPLEX Result Date: 11/13/2023 ABDOMINAL VISCERAL Patient Name:  MISHAEL HARAN [facility]  Date of Exam:   11/13/2023 Medical Rec #: [id number]      Accession #:    [id number] Date of Birth: 07/24/1945      Patient Gender: M Patient Age:   77 years Exam Location:  [facility] Procedure:      VAS US  RENAL ARTERY DUPLEX Referring Phys: [id number] JOSHUA E ROBINS -------------------------------------------------------------------------------- Indications: AKI/CKD IIIb              Hypertensive emergency              Acute aortic syndrome High Risk Factors: Hypertension, coronary artery disease. Other Factors: History of chronic non-functional right kidney. Limitations: Air/bowel gas and obesity. Performing Technologist: Ricka Sturdivant-Jones RDMS, RVT  Examination Guidelines: A complete evaluation includes B-mode imaging, spectral Doppler, color Doppler, and power Doppler as needed of all accessible portions of each vessel. Bilateral testing is considered an integral part of a complete examination. Limited examinations for reoccurring indications may be performed as noted.  Duplex Findings: +--------------------+--------+--------+------+--------------+ Mesenteric          PSV cm/sEDV cm/sPlaque   Comments    +--------------------+--------+--------+------+--------------+ Aorta Mid              93                                +--------------------+--------+--------+------+--------------+ Celiac Artery Origin                      not visualized +--------------------+--------+--------+------+--------------+ SMA Origin                                not visualized +--------------------+--------+--------+------+--------------+    +-----------------+--------+--------+-------+ Left Renal ArteryPSV cm/sEDV cm/sComment +-----------------+--------+--------+-------+ Origin              72      15            +-----------------+--------+--------+-------+ Proximal            58      15           +-----------------+--------+--------+-------+ Mid                 59      11           +-----------------+--------+--------+-------+ Distal              59      10           +-----------------+--------+--------+-------+  Technologist observations: Renal US  on 11/11/23 showed Severe dilatation of the right renal collecting system with parenchymal atrophy. Small left kidney with echogenic parenchyma and simple cyst. +------------+--------+--------+--+-----------+--------+--------+----+ Right KidneyPSV cm/sEDV cm/sRILeft KidneyPSV cm/sEDV cm/sRI   +------------+--------+--------+--+-----------+--------+--------+----+ Upper Pole                    Upper Pole 19      10      0.49 +------------+--------+--------+--+-----------+--------+--------+----+ Mid                           Mid        14      6       0.56 +------------+--------+--------+--+-----------+--------+--------+----+ Lower Pole                    Lower Pole 22      10      0.55 +------------+--------+--------+--+-----------+--------+--------+----+ Hilar                         Hilar      30      9       0.69 +------------+--------+--------+--+-----------+--------+--------+----+ +------------------++------------------+-----+ Right Kidney      Left Kidney             +------------------++------------------+-----+ RAR               RAR                     +------------------++------------------+-----+ RAR (manual)      RAR (manual)      1.29  +------------------++------------------+-----+ Cortex            Cortex                  +------------------++------------------+-----+ Cortex thickness  Corex thickness         +------------------++------------------+-----+ Kidney length (cm)Kidney length (cm)11.70 +------------------++------------------+-----+  Summary: Renal:  Left: Patent left  renal artery without evidence of a significant       stenosis. Cyst noted in the lower pole measuring approximately       3.7 x 3.4 cm lower pole.  *See table(s) above for measurements and observations.  Diagnosing physician: Gaile New MD  Electronically signed by Gaile New MD on 11/13/2023 at 4:59:33 PM.    Final    DG Abd 1 View Result Date: 11/12/2023 CLINICAL DATA:  Nausea, emesis EXAM: ABDOMEN - 1 VIEW COMPARISON:  Earlier same day abdominal radiograph. FINDINGS: AP portable upright view of the upper abdomen and AP supine view of the lower abdomen. There are multiple dilated gas-filled loops of bowel throughout the abdomen which measure up to 3.7 cm in diameter increased from the prior study. No radiographic evidence of focal transition point. Moderate stool burden in the ascending colon. No free air under the diaphragm. IMPRESSION: Dilated gas-filled loops of bowel which are slightly increased from the earlier same day radiograph concerning for ileus versus partial small bowel obstruction. Recommend correlation with abdominal exam and serial radiographs. Electronically Signed   By: Donnice Mania M.D.   On: 11/12/2023 19:15    Chlorhexidine  Gluconate Cloth  6 each Topical Daily   feeding supplement  237 mL Oral BID BM   pantoprazole  (PROTONIX ) IV  40 mg Intravenous QHS    BMET    Component Value Date/Time   NA 134 (L) 11/14/2023 0301   K 4.1 11/14/2023 0301   CL 98  11/14/2023 0301   CO2 19 (L) 11/14/2023 0301   GLUCOSE 90 11/14/2023 0301   BUN 58 (H) 11/14/2023 0301   CREATININE 3.08 (H) 11/14/2023 0301   CALCIUM 9.1 11/14/2023 0301   GFRNONAA 20 (L) 11/14/2023 0301   GFRAA 56 (L) 12/24/2013 1316   CBC    Component Value Date/Time   WBC 5.7 11/14/2023 0518   RBC 4.87 11/14/2023 0518   HGB 14.9 11/14/2023 0518   HCT 42.7 11/14/2023 0518   PLT 250 11/14/2023 0518   MCV 87.7 11/14/2023 0518   MCH 30.6 11/14/2023 0518   MCHC 34.9 11/14/2023 0518   RDW 13.5 11/14/2023 0518    LYMPHSABS 2.3 11/08/2023 2111   MONOABS 1.2 (H) 11/08/2023 2111   EOSABS 0.3 11/08/2023 2111   BASOSABS 0.1 11/08/2023 2111    Assessment/Plan:   AKI/CKD stage IIIb - baseline Scr around 1.9-2.  He has a solitary functioning kidney on the left due to chronic UPJ obstruction on the right.  Bump in Scr is likely hemodynamically mediated with rapid drop in Bp due to aortic dissection.  Also possible contribution from urinary retention.  UOP has dropped over the last 24 hours.  Agree with foley catheter and hold off on lasix  for now.  His Scr may continue to climb over the next 24-48 hours.  Maintain MAP of 65 or greater.  Currently no uremic symptoms or urgent indication for dialysis at this time.  Started on IV lasix  80 mg bid with improvement of hyperkalemia and UOP.  Unfortunately, he now has developed ileus/partial SBO with N/V.  Stopped IV lasix  on 11/13/23 and will follow.  Thankfully his Scr is starting to improve, although he will need to have his dissection repaired per discussion with Dr. Lanis.  Plan is to minimize contrast.    Avoid nephrotoxic medications including NSAIDs and iodinated intravenous contrast exposure unless the latter is absolutely indicated.   Preferred narcotic agents for pain control are hydromorphone , fentanyl , and methadone. Morphine  should not be used.  Avoid Baclofen and avoid oral sodium phosphate  and magnesium  citrate based laxatives / bowel preps.  Continue strict Input and Output monitoring. Will monitor the patient closely with you and intervene or adjust therapy as indicated by changes in clinical status/labs  Acute aortic syndrome - concerning for aortic dissection type B.  Vascular surgery is following.  Given changes to CT, the plan is to proceed with repair today per Dr. Lanis and to minimize contrast during the procedure.    Blood pressures markedly improved with cleviprex and clonidine. Hypertensive emergency  - improved with esmolol  and nicardipine , then  clonidine and cleviprex.  Now on IV labetalol  and hydralazine . Ileus vs partial SBO - has had refractory N/V and NGT placed with some improvement of symptoms. Chest pain - awaiting EKG and troponins NAGMA - agree with starting sodium bicarb tablets and follow.  Paroxysmal atrial fibrillation - long term flecanide, not on anticoagulation. Urinary retention - agree with foley catheter Hyponatremia - mild and normal Sosm.  Likely due to AKI and impaired free water excretion.  Improved with lasix .  Continue to follow.  Hyperkalemia - started lokelma 10 gm daily and lasix  as above.  Resting tremors - apparently new during this admission.  Do not think AKI is related to this.  Continue to follow. Appears to have resolved    Fairy RONAL Sellar, MD [facility]

## 2023-11-14 NOTE — Progress Notes (Signed)
 Looks good postop day #0 s/p TEVAR for contained rupture of IMH. Chest pain improved. No sensory / motor issues in legs. No evidence of stroke. Access site soft without hematoma or pseudoaneurysm Palpable DP. Continue neurochecks overnight.  Debby SAILOR. Magda, MD [facility] Phone Number: [phone] 11/14/2023 5:48 PM

## 2023-11-14 NOTE — Discharge Instructions (Addendum)
 [facility]   Discharge Instructions  Endovascular Aortic Aneurysm Repair  Please refer to the following instructions for your post-procedure care. Your surgeon or Physician Assistant will discuss any changes with you.  Activity  You are encouraged to walk as much as you can. You can slowly return to normal activities but must avoid strenuous activity and heavy lifting until your doctor tells you it's OK. Avoid activities such as vacuuming or swinging a gold club. It is normal to feel tired for several weeks after your surgery. Do not drive until your doctor gives the OK and you are no longer taking prescription pain medications. It is also normal to have difficulty with sleep habits, eating, and bowel movements after surgery. These will go away with time.  Bathing/Showering  Shower daily after you go home.  Do not soak in a bathtub, hot tub, or swim until the incision heals completely.  If you have incisions in your groin, wash the groin wounds with soap and water daily and pat dry. (No tub bath-only shower)  Then put a dry gauze or washcloth there to keep this area dry to help prevent wound infection daily and as needed.  Do not use Vaseline or neosporin on your incisions.  Only use soap and water on your incisions and then protect and keep dry.  Incision Care  Shower every day. Clean your incision with mild soap and water. Pat the area dry with a clean towel. You do not need a bandage unless otherwise instructed. Do not apply any ointments or creams to your incision. If you clothing is irritating, you may cover your incision with a dry gauze pad.  Diet  Resume your normal diet. There are no special food restrictions following this procedure. A low fat/low cholesterol diet is recommended for all patients with vascular disease. In order to heal from your surgery, it is CRITICAL to get adequate nutrition. Your body requires vitamins, minerals, and protein.  Vegetables are the best source of vitamins and minerals. Vegetables also provide the perfect balance of protein. Processed food has little nutritional value, so try to avoid this.  Medications  Resume taking all of your medications unless your doctor or nurse practitioner tells you not to. If your incision is causing pain, you may take over-the-counter pain relievers such as acetaminophen  (Tylenol ). If you were prescribed a stronger pain medication, please be aware these medications can cause nausea and constipation. Prevent nausea by taking the medication with a snack or meal. Avoid constipation by drinking plenty of fluids and eating foods with a high amount of fiber, such as fruits, vegetables, and grains.  Do not take Tylenol  if you are taking prescription pain medications.   Follow up  Our office will schedule a follow-up appointment with a CT scan 3-4 weeks after your surgery.  Please call us  immediately for any of the following conditions  Severe or worsening pain in your legs or feet or in your abdomen back or chest. Increased pain, redness, drainage (pus) from your incision site. Increased abdominal pain, bloating, nausea, vomiting or persistent diarrhea. Fever of 101 degrees or higher. Swelling in your leg (s),  Reduce your risk of vascular disease  Stop smoking. If you would like help call [facility] at 1-800-QUIT-NOW (802-244-6662) or [place] at 724-063-7399. Manage your cholesterol Maintain a desired weight Control your diabetes Keep your blood pressure down  If you have questions, please call the office at [phone].   Information on my medicine -  XARELTO  (Rivaroxaban )  Why was Xarelto  prescribed for you? Xarelto  was prescribed for you to reduce the risk of a blood clot forming that can cause a stroke if you have a medical condition called atrial fibrillation (a type of irregular heartbeat).  What do you need to know about xarelto  ? Take your Xarelto   ONCE DAILY at the same time every day with your evening meal. If you have difficulty swallowing the tablet whole, you may crush it and mix in applesauce just prior to taking your dose.  Take Xarelto  exactly as prescribed by your doctor and DO NOT stop taking Xarelto  without talking to the doctor who prescribed the medication.  Stopping without other stroke prevention medication to take the place of Xarelto  may increase your risk of developing a clot that causes a stroke.  Refill your prescription before you run out.  After discharge, you should have regular check-up appointments with your healthcare provider that is prescribing your Xarelto .  In the future your dose may need to be changed if your kidney function or weight changes by a significant amount.  What do you do if you miss a dose? If you are taking Xarelto  ONCE DAILY and you miss a dose, take it as soon as you remember on the same day then continue your regularly scheduled once daily regimen the next day. Do not take two doses of Xarelto  at the same time or on the same day.   Important Safety Information A possible side effect of Xarelto  is bleeding. You should call your healthcare provider right away if you experience any of the following: Bleeding from an injury or your nose that does not stop. Unusual colored urine (red or dark brown) or unusual colored stools (red or black). Unusual bruising for unknown reasons. A serious fall or if you hit your head (even if there is no bleeding).  Some medicines may interact with Xarelto  and might increase your risk of bleeding while on Xarelto . To help avoid this, consult your healthcare provider or pharmacist prior to using any new prescription or non-prescription medications, including herbals, vitamins, non-steroidal anti-inflammatory drugs (NSAIDs) and supplements.  This website has more information on Xarelto : www.xarelto .com.

## 2023-11-14 NOTE — Progress Notes (Signed)
 eLink Physician-Brief Progress Note Patient Name: Jack [name] DOB: 11-05-1945 MRN: [id number]   Date of Service  11/14/2023  HPI/Events of Note  EKG without evidence of ACS.  eICU Interventions  Will obtain a non contrast CT chest / abdomen / pelvis to follow up on his dissection /aneurysm.        Rayon Mcchristian U Srihith Aquilino 11/14/2023, 6:01 AM

## 2023-11-14 NOTE — Progress Notes (Signed)
 eLink Physician-Brief Progress Note Patient Name: Jack [name] DOB: 1945-08-27 MRN: [id number]   Date of Service  11/14/2023  HPI/Events of Note  CT images and results pending.  eICU Interventions  I signed out follow up of CT results to Dr. Rolan Sharps who is the [facility] attending physician for today.        [name] [name] 11/14/2023, 7:07 AM

## 2023-11-14 NOTE — Progress Notes (Signed)
 eLink Physician-Brief Progress Note Patient Name: Jack [name] DOB: Jul 13, 1945 MRN: [id number]   Date of Service  11/14/2023  HPI/Events of Note  Patient with transient chest pain that woke him from sleep but has now resolved, 12 lead EKG with lateral ST depression.  eICU Interventions  Troponin trend ordered.        [name] 11/14/2023, 11:36 PM

## 2023-11-14 NOTE — Progress Notes (Signed)
 Chaplain responds to page requesting prayer before procedure and provides prayer and support to pt's wife as pt sleeps. She speaks of their faith and God pulling them through. Chaplain encourages her to request chaplain support later if needed.

## 2023-11-14 NOTE — Anesthesia Preprocedure Evaluation (Signed)
 Anesthesia Evaluation  Patient identified by MRN, date of birth, ID band Patient awake    Reviewed: Allergy & Precautions, H&P , NPO status , Patient's Chart, lab work & pertinent test results  History of Anesthesia Complications (+) PONV and history of anesthetic complications  Airway Mallampati: II   Neck ROM: full    Dental   Pulmonary shortness of breath, asthma , former smoker   breath sounds clear to auscultation       Cardiovascular hypertension, + CAD and + Peripheral Vascular Disease   Rhythm:regular Rate:Normal     Neuro/Psych    GI/Hepatic ,GERD  ,,  Endo/Other    Renal/GU Renal disease     Musculoskeletal  (+) Arthritis ,    Abdominal   Peds  Hematology   Anesthesia Other Findings   Reproductive/Obstetrics                             Anesthesia Physical Anesthesia Plan  ASA: 3  Anesthesia Plan: General   Post-op Pain Management:    Induction: Intravenous  PONV Risk Score and Plan: 3 and Ondansetron , Dexamethasone  and Treatment may vary due to age or medical condition  Airway Management Planned: Oral ETT  Additional Equipment: Arterial line  Intra-op Plan:   Post-operative Plan: Extubation in OR  Informed Consent: I have reviewed the patients History and Physical, chart, labs and discussed the procedure including the risks, benefits and alternatives for the proposed anesthesia with the patient or authorized representative who has indicated his/her understanding and acceptance.     Dental advisory given  Plan Discussed with: CRNA, Anesthesiologist and Surgeon  Anesthesia Plan Comments:        Anesthesia Quick Evaluation

## 2023-11-14 NOTE — Transfer of Care (Signed)
 Immediate Anesthesia Transfer of Care Note  Patient: Jack [name]  Procedure(s) Performed: INSERTION, ENDOVASCULAR STENT GRAFT, AORTA, THORACIC  Patient Location: PACU  Anesthesia Type:General  Level of Consciousness: drowsy and patient cooperative  Airway & Oxygen Therapy: Patient Spontanous Breathing and Patient connected to nasal cannula oxygen  Post-op Assessment: Report given to RN and Post -op Vital signs reviewed and stable  Post vital signs: Reviewed and stable  Last Vitals:  Vitals Value Taken Time  BP 138/78 11/14/23 12:30  Temp    Pulse 73 11/14/23 12:33  Resp 14 11/14/23 12:33  SpO2 92 % 11/14/23 12:33  Vitals shown include unfiled device data.  Last Pain:  Vitals:   11/14/23 0915  TempSrc:   PainSc: 0-No pain      Patients Stated Pain Goal: 1 (11/14/23 0400)  Complications: No notable events documented.

## 2023-11-14 NOTE — Progress Notes (Signed)
   NAME:  Jack [name], MRN:  [id number], DOB:  [birth year], LOS: 5 ADMISSION DATE:  11/08/2023, CONSULTATION DATE:  11/09/2023 REFERRING MD: Kommor - EDP, CHIEF COMPLAINT:   Back pain   History of Present Illness:  Jack [name] is a 78 y.o. male with a PMH significant for CAD, CKD, HLD, HTN, PAF, and asthma who presented to the ED with complaints of sudden onset upper back pain. On ED arrival patient was seen hypertensive with BP 180/120, all other vital signs within normal.  Lab work significant for glucose 118, BUN 33, creatinine 190 with GFR 36, all other vital signs within normal limits.  Given concern for possible dissection MR angio chest was obtained and concerning for proximal descending thoracic aorta dissection.  Given AKI CT chest without contrast was also obtained and again concerning for intramural hematoma or contained perforation.  Patient was started on esmolol  and nicardipine  drips with PCCM and vascular consults.  Pertinent Medical History:  CAD, CKD, HLD, HTN, PAF, and asthma   Significant Hospital Events: Including procedures, antibiotic start and stop dates in addition to other pertinent events   6/21 Presented with upper back pain imaging concerning for aortic dissection admitted to ICU for esmolol  and nicardipine  drips 6/23 Esmolol  changed to labetalol , esmolol  changed to cleviprex, labetalol  PO dose increased  Interim History / Subjective:  Chest pain and increased nausea this am. CT done and VVS now planning intervention. +flatus/BM  Objective:   Blood pressure 110/69, pulse 79, temperature 98.6 F (37 C), temperature source Oral, resp. rate 16, height 5' 10 (1.778 m), weight 52.9 kg, SpO2 94%.        Intake/Output Summary (Last 24 hours) at 11/14/2023 0758 Last data filed at 11/14/2023 0700 Gross per 24 hour  Intake 238.31 ml  Output 1400 ml  Net -1161.69 ml   Filed Weights   11/08/23 2103 11/11/23 0500 11/13/23 0240  Weight: 66.7 kg 59.1 kg 52.9 kg    Physical Examination: No distress Abd hypoactive BS, bilious output NGT Lungs sound okay No edema Moves everything to command   Assessment and Plan:   Acute aortic dissection, type B Hypertensive emergency History of CAD Essential hypertension  Hyperlipidemia  Paroxysmal atrial fibrillation  - Unable to tolerate Eliquis , Pradaxa , or Xarelto  as anticoagulation due to drug rash. Long-term flecainide  for rate control. No statins due to myalgias. AKI on CKD, single kidney- renal function slightly better today Urinary retention- foley in place Ileus- a little better today Generalized deconditioning - hospitalized in ICU since PM 6/20  - Planned for OR today or tomorrow for TEVAR - Would place arterial line as difficult stick and help with BP control - NGT to LIS, continue subQ neostigmine - BP/HR control with labetalol  for now - Continue foley for now - [facility] on hold - Appreciate ongoing nephro assistance, high risk to progress to HD unfortunately - Wife updated and is understandably concerned  Best Practice (right click and Reselect all SmartList Selections daily)   Diet/type: ileus DVT prophylaxis SCDs Pressure ulcer(s): N/A GI prophylaxis: PPI Lines: N/A Foley:  N/A Code Status:  full code Last date of multidisciplinary goals of care discussion: 6/25 - Updated at bedside  33 min cc time Rolan Sharps MD PCCM

## 2023-11-14 NOTE — Progress Notes (Signed)
 Spoke with Dr. Alica about IVF.  Will run normal saline at 50cc/hr for one liter of fluid.    Ok for DVT prophylaxis per primary team to start 11/15/2023 am.     Lucie Apt, [facility] 11/14/2023 12:12 PM

## 2023-11-14 NOTE — Progress Notes (Signed)
 OT Cancellation Note  Patient Details Name: Jack [name] MRN: [id number] DOB: Jun 11, 1945   Cancelled Treatment:    Reason Eval/Treat Not Completed: Patient at procedure or test/ unavailable. PT in OR for endovascular stent. OT will continue to follow acutely  Jack [name] 11/14/2023, 9:04 AM  Jack DEL OTR/L Acute Rehabilitation Services Office: [phone]

## 2023-11-14 NOTE — Progress Notes (Addendum)
 eLink Physician-Brief Progress Note Patient Name: KRISTOFFER BALA DOB: 16-Mar-1946 MRN: [id number]   Date of Service  11/14/2023  HPI/Events of Note  Patient with left sided chest pain going down into his arm, he also became tachycardic but it has subsided, no drop in blood pressure.  eICU Interventions  Stat EKG and Troponin, if EKG does not have findings suggestive of acute coronary syndrome will order CT chest to follow up known dissection. Narcotics are on hold secondary to ileus so will order 1 gm of iv Tylenol .        Marcellina PENNER Adriano Bischof 11/14/2023, 5:36 AM

## 2023-11-14 NOTE — Anesthesia Procedure Notes (Signed)
 Central Venous Catheter Insertion Performed by: Maryclare Cornet, MD, anesthesiologist Start/End6/26/2025 10:20 AM, 11/14/2023 10:28 AM Patient location: Pre-op. Preanesthetic checklist: patient identified, IV checked, site marked, risks and benefits discussed, surgical consent, monitors and equipment checked, pre-op evaluation, timeout performed and anesthesia consent Position: Trendelenburg Lidocaine  1% used for infiltration and patient sedated Hand hygiene performed , maximum sterile barriers used  and Seldinger technique used Catheter size: 7 Fr Central line was placed.Double lumen Procedure performed using ultrasound guided technique. Ultrasound Notes:anatomy identified, needle tip was noted to be adjacent to the nerve/plexus identified, no ultrasound evidence of intravascular and/or intraneural injection and image(s) printed for medical record Attempts: 1 Following insertion, line sutured, dressing applied and Biopatch. Post procedure assessment: blood return through all ports, free fluid flow and no air  Patient tolerated the procedure well with no immediate complications.

## 2023-11-14 NOTE — Anesthesia Postprocedure Evaluation (Signed)
 Anesthesia Post Note  Patient: Jack [name]  Procedure(s) Performed: INSERTION, ENDOVASCULAR STENT GRAFT, AORTA, THORACIC     Patient location during evaluation: PACU Anesthesia Type: General Level of consciousness: awake and alert Pain management: pain level controlled Vital Signs Assessment: post-procedure vital signs reviewed and stable Respiratory status: spontaneous breathing, nonlabored ventilation, respiratory function stable and patient connected to nasal cannula oxygen Cardiovascular status: blood pressure returned to baseline and stable Postop Assessment: no apparent nausea or vomiting Anesthetic complications: no   No notable events documented.  Last Vitals:  Vitals:   11/14/23 1615 11/14/23 1630  BP: 132/74 134/76  Pulse: 67 75  Resp: 16 16  Temp:    SpO2: 94% 95%    Last Pain:  Vitals:   11/14/23 1600  TempSrc:   PainSc: 0-No pain                 Jodine Muchmore S

## 2023-11-14 NOTE — Progress Notes (Signed)
 PHARMACY NOTE:  ANTIMICROBIAL RENAL DOSAGE ADJUSTMENT  Current antimicrobial regimen includes a mismatch between antimicrobial dosage and estimated renal function.  As per policy approved by the Pharmacy & Therapeutics and Medical Executive Committees, the antimicrobial dosage will be adjusted accordingly.  Current antimicrobial dosage:  cefazolin  2g IV every 8 hours x2  Indication: Surgical prophylaxis  Renal Function:  Estimated Creatinine Clearance: 18.9 mL/min (A) (by C-G formula based on SCr of 3.08 mg/dL (H)). []      On intermittent HD, scheduled: []      On CRRT    Antimicrobial dosage has been changed to:  adjusted from 2 doses for every 8 hours to 1 dose every 12 hours given CrCl 18 mL/min  Additional comments:   Thank you for allowing pharmacy to participate in this patient's care,  Suzen Sour, PharmD, BCCCP Clinical Pharmacist  Phone: [phone] 11/14/2023 2:25 PM  Please check AMION for all [facility] phone numbers After 10:00 PM, call Main Pharmacy [phone]

## 2023-11-14 NOTE — Anesthesia Procedure Notes (Signed)
 Procedure Name: Intubation Date/Time: 11/14/2023 10:14 AM  Performed by: Marva Lonni PARAS, CRNAPre-anesthesia Checklist: Patient identified, Emergency Drugs available, Suction available and Patient being monitored Patient Re-evaluated:Patient Re-evaluated prior to induction Oxygen Delivery Method: Circle System Utilized Preoxygenation: Pre-oxygenation with 100% oxygen Induction Type: IV induction Ventilation: Mask ventilation without difficulty Laryngoscope Size: Mac and 4 Grade View: Grade I Tube type: Oral Tube size: 7.5 mm Number of attempts: 1 Airway Equipment and Method: Stylet Placement Confirmation: ETT inserted through vocal cords under direct vision, positive ETCO2 and breath sounds checked- equal and bilateral Secured at: 23 cm Tube secured with: Tape Dental Injury: Teeth and Oropharynx as per pre-operative assessment

## 2023-11-15 ENCOUNTER — Encounter (HOSPITAL_COMMUNITY): Payer: Self-pay | Admitting: Vascular Surgery

## 2023-11-15 DIAGNOSIS — N179 Acute kidney failure, unspecified: Secondary | ICD-10-CM | POA: Diagnosis not present

## 2023-11-15 DIAGNOSIS — I161 Hypertensive emergency: Secondary | ICD-10-CM | POA: Diagnosis not present

## 2023-11-15 DIAGNOSIS — K567 Ileus, unspecified: Secondary | ICD-10-CM | POA: Diagnosis not present

## 2023-11-15 DIAGNOSIS — Z95828 Presence of other vascular implants and grafts: Secondary | ICD-10-CM

## 2023-11-15 DIAGNOSIS — I71012 Dissection of descending thoracic aorta: Secondary | ICD-10-CM | POA: Diagnosis not present

## 2023-11-15 LAB — BASIC METABOLIC PANEL WITH GFR
Anion gap: 14 (ref 5–15)
Anion gap: 14 (ref 5–15)
BUN: 55 mg/dL — ABNORMAL HIGH (ref 8–23)
BUN: 53 mg/dL — ABNORMAL HIGH (ref 8–23)
CO2: 24 mmol/L (ref 22–32)
CO2: 23 mmol/L (ref 22–32)
Calcium: 8.6 mg/dL — ABNORMAL LOW (ref 8.9–10.3)
Calcium: 8.7 mg/dL — ABNORMAL LOW (ref 8.9–10.3)
Chloride: 99 mmol/L (ref 98–111)
Chloride: 100 mmol/L (ref 98–111)
Creatinine, Ser: 2.57 mg/dL — ABNORMAL HIGH (ref 0.61–1.24)
Creatinine, Ser: 2.59 mg/dL — ABNORMAL HIGH (ref 0.61–1.24)
GFR, Estimated: 25 mL/min — ABNORMAL LOW (ref >60)
GFR, Estimated: 25 mL/min — ABNORMAL LOW (ref >60)
Glucose, Bld: 96 mg/dL (ref 70–99)
Glucose, Bld: 114 mg/dL — ABNORMAL HIGH (ref 70–99)
Potassium: 3.5 mmol/L (ref 3.5–5.1)
Potassium: 3.9 mmol/L (ref 3.5–5.1)
Sodium: 137 mmol/L (ref 135–145)
Sodium: 137 mmol/L (ref 135–145)

## 2023-11-15 LAB — CBC
HCT: 41.6 % (ref 39.0–52.0)
Hemoglobin: 14.6 g/dL (ref 13.0–17.0)
MCH: 31.2 pg (ref 26.0–34.0)
MCHC: 35.1 g/dL (ref 30.0–36.0)
MCV: 88.9 fL (ref 80.0–100.0)
Platelets: 213 10*3/uL (ref 150–400)
RBC: 4.68 MIL/uL (ref 4.22–5.81)
RDW: 13.6 % (ref 11.5–15.5)
WBC: 6.1 10*3/uL (ref 4.0–10.5)
nRBC: 0 % (ref 0.0–0.2)

## 2023-11-15 LAB — TROPONIN I (HIGH SENSITIVITY)
Troponin I (High Sensitivity): 17 ng/L (ref <18)
Troponin I (High Sensitivity): 17 ng/L (ref <18)

## 2023-11-15 LAB — PHOSPHORUS: Phosphorus: 4.4 mg/dL (ref 2.5–4.6)

## 2023-11-15 LAB — MAGNESIUM
Magnesium: 2.4 mg/dL (ref 1.7–2.4)
Magnesium: 2.5 mg/dL — ABNORMAL HIGH (ref 1.7–2.4)

## 2023-11-15 MED ORDER — LABETALOL HCL 5 MG/ML IV SOLN
10.0000 mg | INTRAVENOUS | Status: DC | PRN
  Administered 2023-11-15: 10 mg via INTRAVENOUS
  Filled 2023-11-15: qty 4

## 2023-11-15 MED ORDER — AMLODIPINE BESYLATE 5 MG PO TABS: 5.0000 mg | ORAL_TABLET | Freq: Every evening | ORAL | Status: DC

## 2023-11-15 MED ORDER — TRAMADOL HCL 50 MG PO TABS
50.0000 mg | ORAL_TABLET | Freq: Two times a day (BID) | ORAL | Status: DC | PRN
  Administered 2023-11-17 (×2): 50 mg
  Filled 2023-11-15 (×2): qty 1

## 2023-11-15 MED ORDER — LABETALOL HCL 5 MG/ML IV SOLN
5.0000 mg | INTRAVENOUS | Status: DC | PRN
  Administered 2023-11-15 (×2): 5 mg via INTRAVENOUS
  Filled 2023-11-15 (×2): qty 4

## 2023-11-15 MED ORDER — ENOXAPARIN SODIUM 30 MG/0.3ML IJ SOSY
30.0000 mg | PREFILLED_SYRINGE | Freq: Every day | INTRAMUSCULAR | Status: DC
  Administered 2023-11-15 – 2023-11-17 (×3): 30 mg via SUBCUTANEOUS
  Filled 2023-11-15 (×3): qty 0.3

## 2023-11-15 MED ORDER — LABETALOL HCL 200 MG PO TABS
100.0000 mg | ORAL_TABLET | Freq: Two times a day (BID) | ORAL | Status: DC
  Administered 2023-11-16 – 2023-11-17 (×3): 100 mg
  Filled 2023-11-15 (×4): qty 1

## 2023-11-15 MED ORDER — AMLODIPINE BESYLATE 5 MG PO TABS
5.0000 mg | ORAL_TABLET | Freq: Every evening | ORAL | Status: DC
  Administered 2023-11-15 – 2023-11-16 (×2): 5 mg
  Filled 2023-11-15 (×2): qty 1

## 2023-11-15 MED ORDER — BISACODYL 10 MG RE SUPP
10.0000 mg | Freq: Once | RECTAL | Status: AC
  Administered 2023-11-15: 10 mg via RECTAL
  Filled 2023-11-15: qty 1

## 2023-11-15 MED ORDER — HYDRALAZINE HCL 20 MG/ML IJ SOLN
10.0000 mg | INTRAMUSCULAR | Status: DC | PRN
  Administered 2023-11-15 – 2023-11-16 (×4): 10 mg via INTRAVENOUS
  Filled 2023-11-15 (×4): qty 1

## 2023-11-15 MED ORDER — FENTANYL CITRATE PF 50 MCG/ML IJ SOSY
25.0000 ug | PREFILLED_SYRINGE | INTRAMUSCULAR | Status: DC | PRN
  Administered 2023-11-15 – 2023-11-16 (×5): 50 ug via INTRAVENOUS
  Filled 2023-11-15 (×5): qty 1

## 2023-11-15 MED ORDER — POTASSIUM CHLORIDE 10 MEQ/50ML IV SOLN
10.0000 meq | INTRAVENOUS | Status: AC
  Administered 2023-11-15 (×2): 10 meq via INTRAVENOUS
  Filled 2023-11-15 (×2): qty 50

## 2023-11-15 MED ORDER — METOPROLOL TARTRATE 5 MG/5ML IV SOLN
2.5000 mg | INTRAVENOUS | Status: DC | PRN
  Administered 2023-11-15: 5 mg via INTRAVENOUS
  Filled 2023-11-15: qty 5

## 2023-11-15 MED ORDER — ACETAMINOPHEN 325 MG PO TABS
650.0000 mg | ORAL_TABLET | Freq: Four times a day (QID) | ORAL | Status: DC | PRN
  Administered 2023-11-15 – 2023-11-17 (×2): 650 mg
  Filled 2023-11-15 (×2): qty 2

## 2023-11-15 MED ORDER — ACETAMINOPHEN 10 MG/ML IV SOLN
1000.0000 mg | Freq: Once | INTRAVENOUS | Status: AC
  Administered 2023-11-15: 1000 mg via INTRAVENOUS
  Filled 2023-11-15: qty 100

## 2023-11-15 MED ORDER — POTASSIUM CHLORIDE 10 MEQ/50ML IV SOLN: 10.0000 meq | INTRAVENOUS | Status: AC

## 2023-11-15 MED ORDER — LABETALOL HCL 5 MG/ML IV SOLN
10.0000 mg | INTRAVENOUS | Status: AC | PRN
  Administered 2023-11-15 – 2023-11-16 (×2): 20 mg via INTRAVENOUS
  Administered 2023-11-17: 10 mg via INTRAVENOUS
  Filled 2023-11-15 (×3): qty 4

## 2023-11-15 NOTE — Plan of Care (Signed)
  Problem: Nutrition: Goal: Adequate nutrition will be maintained Outcome: Not Progressing   Problem: Coping: Goal: Level of anxiety will decrease Outcome: Not Progressing   Problem: Elimination: Goal: Will not experience complications related to bowel motility Outcome: Not Progressing   Problem: Pain Managment: Goal: General experience of comfort will improve and/or be controlled Outcome: Progressing   Problem: Education: Goal: Knowledge of discharge needs will improve Outcome: Progressing   Problem: Respiratory: Goal: Will achieve and/or maintain a regular respiratory rate, without signs or symptoms of dyspnea Outcome: Progressing

## 2023-11-15 NOTE — Progress Notes (Addendum)
  Progress Note    11/15/2023 6:33 AM 1 Day Post-Op  Subjective:  says his nausea is totally resolved.  Feels better.  Wife has questions about medications and if Dr. Verlin needs to be involved in his care.  She reports he was recently seen by him.   Afebrile HR 80's-110's NSR>afib>back in NSR 130's-190's systolic 96% 2LO2NC  Vitals:   11/15/23 0530 11/15/23 0600  BP: (!) 140/81 (!) 145/82  Pulse: 91 87  Resp: 15 13  Temp:    SpO2: 95% 95%    Physical Exam: General:  no distress Cardiac:  regular Lungs:  non labored Incisions:  right groin with bloody bandage-removed-no bleeding and is soft.  Extremities:  palpable DP pulses bilaterally; able to lift both legs off the bed   CBC    Component Value Date/Time   WBC 6.1 11/15/2023 0206   RBC 4.68 11/15/2023 0206   HGB 14.6 11/15/2023 0206   HCT 41.6 11/15/2023 0206   PLT 213 11/15/2023 0206   MCV 88.9 11/15/2023 0206   MCH 31.2 11/15/2023 0206   MCHC 35.1 11/15/2023 0206   RDW 13.6 11/15/2023 0206   LYMPHSABS 2.3 11/08/2023 2111   MONOABS 1.2 (H) 11/08/2023 2111   EOSABS 0.3 11/08/2023 2111   BASOSABS 0.1 11/08/2023 2111    BMET    Component Value Date/Time   NA 137 11/15/2023 0206   K 3.5 11/15/2023 0206   CL 99 11/15/2023 0206   CO2 24 11/15/2023 0206   GLUCOSE 96 11/15/2023 0206   BUN 55 (H) 11/15/2023 0206   CREATININE 2.57 (H) 11/15/2023 0206   CALCIUM  8.6 (L) 11/15/2023 0206   GFRNONAA 25 (L) 11/15/2023 0206   GFRAA 56 (L) 12/24/2013 1316    INR    Component Value Date/Time   INR 1.2 11/14/2023 1236     Intake/Output Summary (Last 24 hours) at 11/15/2023 9366 Last data filed at 11/15/2023 0419 Gross per 24 hour  Intake 1694.72 ml  Output 2795 ml  Net -1100.28 ml      Assessment/Plan:  78 y.o. male is s/p:  TEVAR for contained rupture of descending thoracic intramural hematoma on 11/14/2023 by Dr. Lanis. 1 Day Post-Op   -pt doing well this morning with palpable DP pulses  bilaterally and right groin looks good without hematoma.  Neuro in tact.  Nausea resolved. He looks good from vascular standpoint.   -AKI - creatinine improved to 2.57 this am.  -DVT prophylaxis:  ok for DVT prophylaxis per primary team.   -f/u in 4 weeks with CT scan and carotid duplex and see Dr. Lanis.  Wife states that his July appt with Dr. Lanis was cancelled and rescheduled with Dr. Magda. We will have him get scan and carotid duplex and see Dr. Lanis in about 4 weeks.     Lucie Apt, PA-C [facility] [phone] 11/15/2023 6:33 AM  VASCULAR STAFF ADDENDUM: I have independently interviewed and examined the patient. I agree with the above.  1 day s/p TEVAR. Groins without hematoma. Neuro intact. Cr. Downtrending. Will be set up for office follow up in 4 weeks.   Norman GORMAN Serve MD [facility] Phone Number: [phone] 11/15/2023 3:03 PM

## 2023-11-15 NOTE — Progress Notes (Signed)
 Patient ID: Jack [name], male   DOB: August 02, 1945, 78 y.o.   MRN: [id number] S: Pt developed chest pain last night along with a fib with RVR with HR in the 140's.  Improved this morning.  Wife concerned that he was taken off of labetalol  after surgery. O:BP (!) 145/82   Pulse 87   Temp 98.7 F (37.1 C) (Oral)   Resp 13   Ht 5' 10 (1.778 m)   Wt 61 kg   SpO2 95%   BMI 19.30 kg/m   Intake/Output Summary (Last 24 hours) at 11/15/2023 0813 Last data filed at 11/15/2023 0419 Gross per 24 hour  Intake 1647.93 ml  Output 2295 ml  Net -647.07 ml   Intake/Output: I/O last 3 completed shifts: In: 1774.2 [I.V.:1689.1; IV Piggyback:85] Out: 2795 [Urine:1545; Emesis/NG output:1200; Blood:50]  Intake/Output this shift:  No intake/output data recorded. Weight change:  Gen: NAD CVS: RRR Resp:CTA Abd: distended, hypoactive bowel sounds, mildly tender Ext: no edema  Recent Labs  Lab 11/08/23 2111 11/10/23 0226 11/11/23 0238 11/11/23 1101 11/12/23 0311 11/13/23 0348 11/14/23 0301 11/14/23 1236 11/15/23 0206  NA 140 134* 131*  --  127* 129* 134* 137 137  K 3.8 4.2 4.8  --  5.4* 4.0 4.1 3.8 3.5  CL 103 108 105  --  103 96* 98 97* 99  CO2 25 21* 15*  --  14* 23 19* 22 24  GLUCOSE 118* 113* 103*  --  110* 115* 90 118* 96  BUN 33* 31* 35*  --  43* 51* 58* 58* 55*  CREATININE 1.90* 2.37* 2.58*  --  2.86* 3.23* 3.08* 3.02* 2.57*  ALBUMIN 3.8  --   --  2.6*  --   --   --   --   --   CALCIUM  9.7 8.2* 8.4*  --  8.5* 8.9 9.1 8.7* 8.6*  PHOS  --  3.2  --   --  4.8* 6.8* 5.8*  --  4.4  AST 25  --   --  14*  --   --   --   --   --   ALT 20  --   --  14  --   --   --   --   --    Liver Function Tests: Recent Labs  Lab 11/08/23 2111 11/11/23 1101  AST 25 14*  ALT 20 14  ALKPHOS 66 55  BILITOT 0.3 0.9  PROT 6.7 5.3*  ALBUMIN 3.8 2.6*   Recent Labs  Lab 11/08/23 2111  LIPASE 38   Recent Labs  Lab 11/11/23 1101  AMMONIA 25   CBC: Recent Labs  Lab 11/08/23 2111 11/10/23 0226  11/12/23 0311 11/13/23 0348 11/14/23 0518 11/14/23 1236 11/15/23 0206  WBC 8.5   < > 10.9* 7.8 5.7 6.7 6.1  NEUTROABS 4.5  --   --   --   --   --   --   HGB 16.2   < > 12.6* 14.0 14.9 14.1 14.6  HCT 47.3   < > 36.2* 39.6 42.7 40.2 41.6  MCV 90.4   < > 88.1 85.7 87.7 88.5 88.9  PLT 251   < > 184 223 250 229 213   < > = values in this interval not displayed.   Cardiac Enzymes: No results for input(s): CKTOTAL, CKMB, CKMBINDEX, TROPONINI in the last 168 hours. CBG: Recent Labs  Lab 11/09/23 1414 11/10/23 0854 11/12/23 2312 11/14/23 0755 11/14/23 1417  GLUCAP 102* 125* 141* 108*  110*    Iron Studies: No results for input(s): IRON, TIBC, TRANSFERRIN, FERRITIN in the last 72 hours. Studies/Results: DG Chest Port 1 View Result Date: 11/14/2023 CLINICAL DATA:  Graft placement EXAM: PORTABLE CHEST 1 VIEW COMPARISON:  Chest x-ray 11/11/2023 FINDINGS: New left-sided central venous catheter tip ends at the brachiocephalic SVC junction. Enteric tube extends below the diaphragm. There is a new endovascular graft in the descending thoracic aorta. The thoracic aorta is ectatic. The heart is enlarged. There is a small right pleural effusion. The lungs are otherwise clear. There is no pneumothorax or focal lung infiltrate. No acute osseous abnormality. IMPRESSION: 1. New left-sided central venous catheter with distal tip ending at the brachiocephalic SVC junction. 2. New endovascular graft in the descending thoracic aorta. 3. Small right pleural effusion. 4. Cardiomegaly. Electronically Signed   By: Greig Pique M.D.   On: 11/14/2023 16:56   HYBRID OR IMAGING ([facility] ONLY) Result Date: 11/14/2023 There is no interpretation for this exam.  This order is for images obtained during a surgical procedure.  Please See Surgeries Tab for more information regarding the procedure.   CT CHEST ABDOMEN PELVIS WO CONTRAST Result Date: 11/14/2023 CLINICAL DATA:  Follow-up of previous finding of  contained perforation or intramural hematoma along the posterolateral aspect of the proximal descending aorta. EXAM: CT CHEST, ABDOMEN AND PELVIS WITHOUT CONTRAST TECHNIQUE: Multidetector CT imaging of the chest, abdomen and pelvis was performed following the standard protocol without IV contrast. RADIATION DOSE REDUCTION: This exam was performed according to the departmental dose-optimization program which includes automated exposure control, adjustment of the mA and/or kV according to patient size and/or use of iterative reconstruction technique. COMPARISON:  Chest CT without contrast as well as MRA thorax and abdomen both 5 days ago 11/09/2023. CT abdomen pelvis with no contrast 01/22/2017. FINDINGS: CT CHEST FINDINGS Cardiovascular: The heart is slightly enlarged. The coronary arteries and aorta are heavily calcified. There is aortic tortuosity, patchy calcification in the great vessels. At the posterolateral wall of the proximal descending aorta, again noted is a subtle, possible extraluminal density or intraluminal hematoma, on 2:26 estimated 2.5 x 0.9 cm, was previously 2 x 1 cm and again could be a contained perforation or mural hematoma. It is less dense than previously, was previously 55 Hounsfield units now measuring 33. There is stable aneurysmal dilatation in distal descending aorta, at the hiatal level is 4.2 cm AP and 4.3 cm transverse. Aortic dissection is not assessed without contrast. The pulmonary arteries and veins are normal caliber. There is no pericardial effusion. Mediastinum/Nodes: NGT enters the stomach with the side hole in the distal esophagus and needs to be advanced further in 10 cm. Thoracic trachea main bronchi are unremarkable. Thyroid  gland and axillary spaces are unremarkable. No intrathoracic adenopathy is seen. Lungs/Pleura: 5 mm right middle lobe nodule again noted on 3:85. No follow-up imaging recommended. There is increased consolidation with air bronchograms the posteromedial  left lower lobe consistent with pneumonia or aspiration. There are increased small layering pleural effusions, slightly more so on the left. No pleural hemorrhage is seen. There are mild paraseptal emphysematous changes in the upper lung apices. Increased posterior subsegmental atelectasis right lower lobe. Rest of lungs are generally clear.  No pneumothorax. Musculoskeletal: No chest wall mass or suspicious bone lesions identified. CT ABDOMEN PELVIS FINDINGS Hepatobiliary: No focal liver abnormality is seen. No gallstones, gallbladder wall thickening, or biliary dilatation. Pancreas: Partially atrophic.  Otherwise unremarkable. Spleen: Normal in size without focal abnormality. Adrenals/Urinary Tract: Severe  chronic right hydronephrosis advanced cortical atrophy consistent with chronic UPJ obstruction. There is no adrenal mass, no contour deforming abnormality of the left kidney apart from a Bosniak 1 3.6 cm cyst in the inferior pole, Hounsfield density is 1. There is minimal left perinephric fluid. There is no urinary stone or obstruction. The bladder is catheterized, contracted and not well seen. Stomach/Bowel: Portions of the anterior abdomen were excluded from the field of view. There is fluid dilatation of the stomach and small bowel up to 4.2 cm small-bowel caliber, continues down to the terminal ileal segment. There is no discrete transition. Findings could be due to ileus or mechanical obstruction at the ileocecal junction. There is moderate retained stool in the ascending colon. Uncomplicated sigmoid diverticulosis. No wall thickening or inflammatory change. Vascular/Lymphatic: Moderate to heavy aortoiliac calcific plaque with branch vessel atherosclerosis. Maximum infrarenal aortic caliber 2.8 cm. No adenopathy is seen. Reproductive: Enlarged prostate, 5.6 cm transverse. Other: The excluded portion of the anterior abdomen includes the area of the umbilicus. An umbilical hernia would be missed. There are  small bilateral inguinal fat hernias. There is a small volume of pelvic ascites. No drainable pocket. No free air. Musculoskeletal: No acute or significant osseous findings. IMPRESSION: 1. 2.5 x 0.9 cm subtle, possible extraluminal density such as a contained perforation, or intraluminal hematoma along the posterolateral wall of the proximal descending aorta, previously 2 x 1 cm. It is less dense than previously. 2. Stable aneurysmal dilatation of the distal descending aorta, 4.2 x 4.3 cm. 3. Increased consolidation in the posteromedial left lower lobe consistent with pneumonia or aspiration. 4. Increased small layering pleural effusions, slightly more so on the left. No pleural hemorrhage is seen. 5. NGT side hole in the distal esophagus and needs to be advanced further in 10 cm. 6. Emphysema. 7. Aortic and coronary artery atherosclerosis. 8. Fluid dilatation of the stomach and small bowel down to the terminal ileal segment without discrete transition. Findings could be due to ileus or mechanical obstruction at the ileocecal junction. The umbilicus was not included in the scan and an umbilical hernia would be missed. 9. Small volume of pelvic ascites. 10. Chronic right UPJ obstruction with severe hydronephrosis and advanced cortical atrophy. 11. Prostatomegaly. 12. Small bilateral inguinal fat hernias. Aortic Atherosclerosis (ICD10-I70.0) and Emphysema (ICD10-J43.9). Electronically Signed   By: Francis Quam M.D.   On: 11/14/2023 07:17   VAS US  RENAL ARTERY DUPLEX Result Date: 11/13/2023 ABDOMINAL VISCERAL Patient Name:  SEHAJ MCENROE [facility]  Date of Exam:   11/13/2023 Medical Rec #: [id number]      Accession #:    [id number] Date of Birth: 04/27/1946      Patient Gender: M Patient Age:   5 years Exam Location:  [facility] Procedure:      VAS US  RENAL ARTERY DUPLEX Referring Phys: [id number] JOSHUA E ROBINS -------------------------------------------------------------------------------- Indications: AKI/CKD IIIb               Hypertensive emergency              Acute aortic syndrome High Risk Factors: Hypertension, coronary artery disease. Other Factors: History of chronic non-functional right kidney. Limitations: Air/bowel gas and obesity. Performing Technologist: Ricka Sturdivant-Jones RDMS, RVT  Examination Guidelines: A complete evaluation includes B-mode imaging, spectral Doppler, color Doppler, and power Doppler as needed of all accessible portions of each vessel. Bilateral testing is considered an integral part of a complete examination. Limited examinations for reoccurring indications may be performed as noted.  Duplex  Findings: +--------------------+--------+--------+------+--------------+ Mesenteric          PSV cm/sEDV cm/sPlaque   Comments    +--------------------+--------+--------+------+--------------+ Aorta Mid              93                                +--------------------+--------+--------+------+--------------+ Celiac Artery Origin                      not visualized +--------------------+--------+--------+------+--------------+ SMA Origin                                not visualized +--------------------+--------+--------+------+--------------+    +-----------------+--------+--------+-------+ Left Renal ArteryPSV cm/sEDV cm/sComment +-----------------+--------+--------+-------+ Origin              72      15           +-----------------+--------+--------+-------+ Proximal            58      15           +-----------------+--------+--------+-------+ Mid                 59      11           +-----------------+--------+--------+-------+ Distal              59      10           +-----------------+--------+--------+-------+  Technologist observations: Renal US  on 11/11/23 showed Severe dilatation of the right renal collecting system with parenchymal atrophy. Small left kidney with echogenic parenchyma and simple cyst.  +------------+--------+--------+--+-----------+--------+--------+----+ Right KidneyPSV cm/sEDV cm/sRILeft KidneyPSV cm/sEDV cm/sRI   +------------+--------+--------+--+-----------+--------+--------+----+ Upper Pole                    Upper Pole 19      10      0.49 +------------+--------+--------+--+-----------+--------+--------+----+ Mid                           Mid        14      6       0.56 +------------+--------+--------+--+-----------+--------+--------+----+ Lower Pole                    Lower Pole 22      10      0.55 +------------+--------+--------+--+-----------+--------+--------+----+ Hilar                         Hilar      30      9       0.69 +------------+--------+--------+--+-----------+--------+--------+----+ +------------------++------------------+-----+ Right Kidney      Left Kidney             +------------------++------------------+-----+ RAR               RAR                     +------------------++------------------+-----+ RAR (manual)      RAR (manual)      1.29  +------------------++------------------+-----+ Cortex            Cortex                  +------------------++------------------+-----+ Cortex thickness  Corex thickness         +------------------++------------------+-----+  Kidney length (cm)Kidney length (cm)11.70 +------------------++------------------+-----+  Summary: Renal:  Left: Patent left renal artery without evidence of a significant       stenosis. Cyst noted in the lower pole measuring approximately       3.7 x 3.4 cm lower pole.  *See table(s) above for measurements and observations.  Diagnosing physician: Gaile New MD  Electronically signed by Gaile New MD on 11/13/2023 at 4:59:33 PM.    Final     aspirin  EC  81 mg Oral Q0600   Chlorhexidine  Gluconate Cloth  6 each Topical Daily   feeding supplement  237 mL Oral BID BM   pantoprazole  (PROTONIX ) IV  40 mg Intravenous QHS    BMET     Component Value Date/Time   NA 137 11/15/2023 0206   K 3.5 11/15/2023 0206   CL 99 11/15/2023 0206   CO2 24 11/15/2023 0206   GLUCOSE 96 11/15/2023 0206   BUN 55 (H) 11/15/2023 0206   CREATININE 2.57 (H) 11/15/2023 0206   CALCIUM  8.6 (L) 11/15/2023 0206   GFRNONAA 25 (L) 11/15/2023 0206   GFRAA 56 (L) 12/24/2013 1316   CBC    Component Value Date/Time   WBC 6.1 11/15/2023 0206   RBC 4.68 11/15/2023 0206   HGB 14.6 11/15/2023 0206   HCT 41.6 11/15/2023 0206   PLT 213 11/15/2023 0206   MCV 88.9 11/15/2023 0206   MCH 31.2 11/15/2023 0206   MCHC 35.1 11/15/2023 0206   RDW 13.6 11/15/2023 0206   LYMPHSABS 2.3 11/08/2023 2111   MONOABS 1.2 (H) 11/08/2023 2111   EOSABS 0.3 11/08/2023 2111   BASOSABS 0.1 11/08/2023 2111     Assessment/Plan:   AKI/CKD stage IIIb - baseline Scr around 1.9-2.  He has a solitary functioning kidney on the left due to chronic UPJ obstruction on the right.  Bump in Scr is likely hemodynamically mediated with rapid drop in Bp due to aortic dissection.  Also possible contribution from urinary retention.  UOP has dropped over the last 24 hours.  Agree with foley catheter and hold off on lasix  for now.  His Scr may continue to climb over the next 24-48 hours.  Maintain MAP of 65 or greater.  Currently no uremic symptoms or urgent indication for dialysis at this time.  Started on IV lasix  80 mg bid with improvement of hyperkalemia and UOP.  Unfortunately, he now has developed ileus/partial SBO with N/V.  Stopped IV lasix  on 11/13/23 and will follow.  Thankfully his Scr continues to slowly improve despite IV contrast with TEVAR.    Avoid nephrotoxic medications including NSAIDs and iodinated intravenous contrast exposure unless the latter is absolutely indicated.   Preferred narcotic agents for pain control are hydromorphone , fentanyl , and methadone. Morphine  should not be used.  Avoid Baclofen and avoid oral sodium phosphate  and magnesium  citrate based laxatives /  bowel preps.  Continue strict Input and Output monitoring. Will monitor the patient closely with you and intervene or adjust therapy as indicated by changes in clinical status/labs  Acute aortic syndrome - concerning for aortic dissection type B.  Vascular surgery is following.  Given changes to CT, he underwent TEVAR on 11/14/23 by Dr. Magda without complications.   Hypertensive emergency  - improved with esmolol  and nicardipine , then clonidine  and cleviprex .  Was on IV labetalol  and hydralazine , however labetalol  was discontinued for unclear reasons. Blood pressure and heart rate have been elevated since surgery.  Will stop metoprolol  and resume IV labetalol  for SBP >130 and/or  HR >90 given h/o a fib and RVR last night (and also per wife's request). Ileus vs partial SBO - has had refractory N/V and NGT placed with some improvement of symptoms. Chest pain - awaiting EKG and troponins NAGMA - agree with starting sodium bicarb tablets and follow.  Paroxysmal atrial fibrillation - long term flecanide, not on anticoagulation. Urinary retention - agree with foley catheter Hyponatremia - mild and normal Sosm.  Likely due to AKI and impaired free water excretion.  Improved with lasix .  Continue to follow.  Hyperkalemia - started lokelma  10 gm daily and lasix  as above.  Resting tremors - apparently new during this admission.  Do not think AKI is related to this.  Continue to follow. Appears to have resolved  Fairy RONAL Sellar, MD [facility] [phone]

## 2023-11-15 NOTE — Progress Notes (Signed)
 NAME:  Jack [name], MRN:  [id number], DOB:  1945-08-19, LOS: 6 ADMISSION DATE:  11/08/2023, CONSULTATION DATE:  11/09/2023 REFERRING MD: Kommor - EDP, CHIEF COMPLAINT:   Back pain   History of Present Illness:  Jack [name] is a 78 y.o. male with a PMH significant for CAD, CKD, HLD, HTN, PAF, and asthma who presented to the ED with complaints of sudden onset upper back pain. On ED arrival patient was seen hypertensive with BP 180/120, all other vital signs within normal.  Lab work significant for glucose 118, BUN 33, creatinine 190 with GFR 36, all other vital signs within normal limits.  Given concern for possible dissection MR angio chest was obtained and concerning for proximal descending thoracic aorta dissection.  Given AKI CT chest without contrast was also obtained and again concerning for intramural hematoma or contained perforation.  Patient was started on esmolol  and nicardipine  drips with PCCM and vascular consults.  Pertinent Medical History:  CAD, CKD, HLD, HTN, PAF, and asthma   Significant Hospital Events: Including procedures, antibiotic start and stop dates in addition to other pertinent events   6/21 Presented with upper back pain imaging concerning for aortic dissection admitted to ICU for esmolol  and nicardipine  drips 6/23 Esmolol  changed to labetalol , esmolol  changed to cleviprex , labetalol  PO dose increased 6/24 Nausea/ileus, NGT placed in PM with emesis. 6/25 PO antihypertensives transitioned to labetalol  gtt. 6/26 Early AM severe CP/back pain prompting repeat CT; concern for contained perf versus intraluminal hematoma. L radial A-line placed for BP monitoring. Taken to OR by VVS for TEVAR.   Interim History / Subjective:  Episode of Afib overnight with rates up to 140, in NSR at present Some bleeding from L radial A-line site requiring dressing change Some mild-moderate back pain, otherwise feeling better POD#1 from TEVAR with [facility] ([facility]) SBP 140s, BP cuff and  A-line now coordinating Improved abdominal distention/increasing bowel sounds, watching for ROBF   Objective:   Blood pressure (!) 147/83, pulse 90, temperature 98.7 F (37.1 C), temperature source Oral, resp. rate 18, height 5' 10 (1.778 m), weight 61 kg, SpO2 90%.        Intake/Output Summary (Last 24 hours) at 11/15/2023 0916 Last data filed at 11/15/2023 0900 Gross per 24 hour  Intake 1689.29 ml  Output 2770 ml  Net -1080.71 ml   Filed Weights   11/13/23 0240 11/14/23 0904 11/15/23 0421  Weight: 52.9 kg 66.7 kg 61 kg   Physical Examination: General: Chronically ill-appearing older man in NAD. Pleasant and conversant. HEENT: [place]/AT, anicteric sclera, PERRL, moist mucous membranes. NGT in place. Neuro: Awake, oriented x 4. Responds to verbal stimuli. Following commands consistently. Moves all 4 extremities spontaneously. Generalized weakness. CV: RRR, no m/g/r. PULM: Breathing even and unlabored on RA. Lung fields CTAB anteriorly. GI: Soft, nontender, nondistended. Bowel sounds slightly hypoactive, improved from prior. Extremities: No LE edema noted. R groin access site c/d/i without bleeding/erythema/swelling. Skin: Warm/dry, no rashes.  Assessment and Plan:   Acute aortic dissection, type B S/p TEVAR 6/26 Hypertensive emergency US  Renal Artery Duplex completed, negative for significant stenosis. CT Chest/A/P with 2.5 x 0.9cm subtle ?extraluminal density (contained perf?) or intraluminal hematoma along the posterolateral wall of the proximal descending aorta and stable aneurysmal dilatation of distal descending aorta (4.2 x 4.3cm). - POD#1 from TEVAR with [facility] ([facility]) - Goal SBP < 160 s/p repair - Off of labetalol  gtt at present - Postoperative management per VVS - Multimodal pain control (APAP, tramadol  - sparingly,  Fentanyl  PRN) - Repeat CT/carotid duplex and f/u in 4 weeks per VVS  History of CAD Essential hypertension  Hyperlipidemia  Paroxysmal atrial fibrillation   - Unable to tolerate Eliquis , Pradaxa , or Xarelto  as anticoagulation due to drug rash. Long-term flecainide  for rate control. No statins due to myalgias. - Cardiac monitoring - Optimize electrolytes for K > 4, Mg > 2 - Continue ASA, ok for DVT ppx per [facility]VS - Off of labetalol  gtt, resume home antihypertensives as tolerated for goal SBP < 160  AKI on CKD, single kidney Per chart review, patient had cardiac cath in 2010 after which she has suffered with mild renal insufficiency due to suspected atheroemboli in contrast induced nephropathy.  Patient had a AV fistula placed in 2011 and later removed in 2015 as he never progressed to ESRD. Renal US  6/23 without new obstruction. Urinary retention Hyperkalemia, improved Hyponatremia, improving NAGMA, improved - Nephrology following, appreciate assistance - Trend BMP, Cr improving - Replete electrolytes as indicated - Hold Lasix  - Monitor I&Os, continue Foley - Continue Flomax  - Avoid nephrotoxic agents as able - Ensure adequate renal perfusion  Abdominal distention and constipation, presumed ileus - Continue Reglan , monitor QTc - S/p neostigmine, bowel sounds improving - Bowel regimen - PT/OT, OOB/encourage mobilization - NGT in place, monitoring for ROBF - Slowly ADAT to CLD as tolerated  L arm pain - localized to biceps area, non-radiating. Doubt cardiac/ischemia related, likely MSK Mild troponin elevation, not significant and pain resolved 6/25AM. - OOB/encourage mobilization, PT/OT  Resting tremor of UE's - unclear etiology, new per wife this admit. LFT's and ammonia OK. - Supportive care  Generalized deconditioning - hospitalized in ICU since [facility] 6/20 - PT/OT - RD/Nutrition consult as needed - High protein diet + supplements (Ensure) - ?CIR for dispo, evaluation pending  Best Practice (right click and Reselect all SmartList Selections daily)   Diet/type: Regular consistency (see orders) DVT prophylaxis SCDs Pressure  ulcer(s): N/A GI prophylaxis: PPI Lines: N/A Foley:  N/A Code Status:  full code Last date of multidisciplinary goals of care discussion: 6/27 - Patient/wife updated at bedside  Critical care time: N/A   Corean CHRISTELLA Ilah DEVONNA [facility] 11/15/23 9:16 AM  Please see [email or web address] for pager details.  From 7A-7P if no response, please call [phone] After hours, please call ELink [phone]

## 2023-11-15 NOTE — Progress Notes (Signed)
 Inpatient Rehab Admissions:  Inpatient Rehab Consult received.  I met with patient and wife, [name] at the bedside for rehabilitation assessment and to discuss goals and expectations of an inpatient rehab admission.  Discussed average length of stay, insurance authorization requirement and discharge home after completion of CIR. Both acknowledged understanding. Pt and wife are hopeful pt will be able to discharge home from acute care. If pt will not be able to discharge home, they are open to CIR. Wife confirmed that she will be able to provide 24/7 support for pt after d/c. Will continue to follow.  Signed: Tinnie Yvone Cohens, MS, CCC-SLP Admissions Coordinator [phone]

## 2023-11-15 NOTE — Progress Notes (Signed)
 Occupational Therapy Treatment Patient Details Name: Jack [name] MRN: [id number] DOB: March 30, 1946 Today's Date: 11/15/2023   History of present illness 2M who was admitted for proximal descending thoracic aortic dissection. 6/24 NGT placed 6/26 TEVAR PMH: CAD, HTN, CKD, asthma, and PAF   OT comments  Pt progressed from supine to chair this session with dizziness sitting up and standing up. Pt benefits from gaze stabilization. Pt tolerated VOR exercises in sitting. Pt educated on RW use with L wrist ALine and cues for safety. Recommendation for Patient will benefit from intensive inpatient follow-up therapy, >3 hours/day       If plan is discharge home, recommend the following:  Two people to help with walking and/or transfers;Two people to help with bathing/dressing/bathroom   Equipment Recommendations  BSC/3in1;Other (comment)    Recommendations for Other Services Rehab consult    Precautions / Restrictions Precautions Precautions: Fall Recall of Precautions/Restrictions: Impaired Precaution/Restrictions Comments: watch tremors, BP, NG tube Restrictions Weight Bearing Restrictions Per Provider Order: No       Mobility Bed Mobility Overal bed mobility: Needs Assistance Bed Mobility: Supine to Sit     Supine to sit: Min assist     General bed mobility comments: hob elevated and (A) to scoot to eob . pt needed cues throughout to avoid press on L wrist for extension    Transfers Overall transfer level: Needs assistance Equipment used: Rolling walker (2 wheels) Transfers: Sit to/from Stand Sit to Stand: +2 physical assistance, Min assist           General transfer comment: pt needs (A) to power up with a slight posterior bias     Balance Overall balance assessment: Needs assistance Sitting-balance support: Bilateral upper extremity supported, Feet supported Sitting balance-Leahy Scale: Fair     Standing balance support: Reliant on assistive device for  balance, During functional activity Standing balance-Leahy Scale: Poor                             ADL either performed or assessed with clinical judgement   ADL Overall ADL's : Needs assistance/impaired     Grooming: Minimal assistance;Standing Grooming Details (indicate cue type and reason): pt reports feeling that legs are weak but able to static stand at sink for 5 minutes             Lower Body Dressing: Total assistance   Toilet Transfer: Minimal assistance;+2 for safety/equipment;+2 for physical assistance           Functional mobility during ADLs: Minimal assistance;Rolling walker (2 wheels) General ADL Comments: pt requires cues for L Aline and wrist neutral position    Extremity/Trunk Assessment Upper Extremity Assessment Upper Extremity Assessment: Generalized weakness RUE Deficits / Details: tremor present, edema noted at the elbow and upper arm , redness, IV site LUE Deficits / Details: aline at wrist and noted to have some drainage prior to session but not during session   Lower Extremity Assessment Lower Extremity Assessment: Defer to PT evaluation        Vision       Perception     Praxis     Communication Communication Communication: Impaired Factors Affecting Communication: Hearing impaired   Cognition Arousal: Alert Behavior During Therapy: Flat affect Cognition: Cognition impaired     Awareness: Intellectual awareness intact, Online awareness impaired       OT - Cognition Comments: cognition more improved from prior session but remains impulsive with safety cues  Following commands: Impaired Following commands impaired: Only follows one step commands consistently      Cueing      Exercises Exercises: General Lower Extremity General Exercises - Lower Extremity Ankle Circles/Pumps: AROM, Both, 10 reps, Supine Quad Sets: AROM, Both, 10 reps, Supine Long Arc Quad: AROM, Both, 10 reps, Seated     Shoulder Instructions       General Comments VSS on RA, NGT clamped by RN,    Pertinent Vitals/ Pain       Pain Assessment Pain Assessment: No/denies pain  Home Living                                          Prior Functioning/Environment              Frequency  Min 2X/week        Progress Toward Goals  OT Goals(current goals can now be found in the care plan section)  Progress towards OT goals: Progressing toward goals  Acute Rehab OT Goals Patient Stated Goal: to get up OT Goal Formulation: With patient/family Time For Goal Achievement: 11/26/23 Potential to Achieve Goals: Good ADL Goals Pt Will Perform Eating: with set-up;with adaptive utensils;sitting Pt Will Perform Grooming: with set-up;sitting Pt Will Transfer to Toilet: with min assist;stand pivot transfer;bedside commode Additional ADL Goal #1: pt will complete bed mobility mod I as precursor to adls. Additional ADL Goal #2: pt will complete cognitive task with less than 2 errors  Plan      Co-evaluation    PT/OT/SLP Co-Evaluation/Treatment: Yes Reason for Co-Treatment: For patient/therapist safety;Necessary to address cognition/behavior during functional activity   OT goals addressed during session: ADL's and self-care      AM-PAC OT 6 Clicks Daily Activity     Outcome Measure   Help from another person eating meals?: A Little Help from another person taking care of personal grooming?: A Little Help from another person toileting, which includes using toliet, bedpan, or urinal?: A Lot Help from another person bathing (including washing, rinsing, drying)?: A Lot Help from another person to put on and taking off regular upper body clothing?: A Little Help from another person to put on and taking off regular lower body clothing?: A Lot 6 Click Score: 15    End of Session Equipment Utilized During Treatment: Gait belt;Rolling walker (2 wheels)  OT Visit Diagnosis:  Unsteadiness on feet (R26.81);Muscle weakness (generalized) (M62.81)   Activity Tolerance Patient tolerated treatment well   Patient Left in chair;with call bell/phone within reach;with chair alarm set;with nursing/sitter in room   Nurse Communication Mobility status;Precautions        Time: 9084-9056 OT Time Calculation (min): 28 min  Charges: OT General Charges $OT Visit: 1 Visit OT Treatments $Self Care/Home Management : 8-22 mins   Brynn, OTR/L  Acute Rehabilitation Services Office: [phone] .   Ely Molt 11/15/2023, 10:22 AM

## 2023-11-15 NOTE — Progress Notes (Signed)
 Physical Therapy Treatment Patient Details Name: Jack [name] MRN: [id number] DOB: 02/28/1946 Today's Date: 11/15/2023   History of Present Illness 29M who was admitted for proximal descending thoracic aortic dissection. 6/24 NGT placed.  6/26 TEVAR PMH: CAD, HTN, CKD, asthma, and PAF    PT Comments  Tolerated well, focused on bed mobility training (min assist), transfers (min assist +2), and gait (min assist +2) with rolling walker. Able to traverse into bathroom for oral care/hygiene with OT support, and back to recliner. Fairly fatigued with distance. States LEs feel weak and wobbly. No significant fluctuation with VS during visit. Reviewed LE exercises. Encouraged OOB several times per day with staff as tolerated. Patient will continue to benefit from skilled physical therapy services to further improve independence with functional mobility.     If plan is discharge home, recommend the following: A lot of help with walking and/or transfers;A lot of help with bathing/dressing/bathroom;Assist for transportation;Help with stairs or ramp for entrance   Can travel by private vehicle        Equipment Recommendations   (TBD)    Recommendations for Other Services Rehab consult     Precautions / Restrictions Precautions Precautions: Fall Recall of Precautions/Restrictions: Impaired Precaution/Restrictions Comments: tremors, BP, NG tube Restrictions Weight Bearing Restrictions Per Provider Order: No     Mobility  Bed Mobility Overal bed mobility: Needs Assistance Bed Mobility: Supine to Sit     Supine to sit: Min assist, HOB elevated     General bed mobility comments: hob elevated and (A) to scoot to eob . pt needed cues throughout to avoid press on L wrist for extension.    Transfers Overall transfer level: Needs assistance Equipment used: Rolling walker (2 wheels) Transfers: Sit to/from Stand Sit to Stand: +2 physical assistance, Min assist           General transfer  comment: Min assist for boost to stand, cues for forward weight shift, and neutral wrist due to aline.    Ambulation/Gait Ambulation/Gait assistance: Min assist, +2 safety/equipment Gait Distance (Feet): 35 Feet Assistive device: Rolling walker (2 wheels) Gait Pattern/deviations: Step-through pattern, Decreased stride length, Drifts right/left Gait velocity: dec Gait velocity interpretation: <1.31 ft/sec, indicative of household ambulator   General Gait Details: Minor instability with min assist for balance, RW control, and + 2 for safety with management of lines/leads. Cues for proximity to device.   Stairs             Wheelchair Mobility     Tilt Bed    Modified Rankin (Stroke Patients Only)       Balance Overall balance assessment: Needs assistance Sitting-balance support: Feet supported, No upper extremity supported Sitting balance-Leahy Scale: Fair     Standing balance support: Reliant on assistive device for balance, During functional activity Standing balance-Leahy Scale: Poor                              Communication Communication Communication: Impaired Factors Affecting Communication: Hearing impaired  Cognition Arousal: Alert Behavior During Therapy: Flat affect   PT - Cognitive impairments: No apparent impairments                         Following commands: Impaired Following commands impaired: Only follows one step commands consistently    Cueing Cueing Techniques: Verbal cues  Exercises General Exercises - Lower Extremity Ankle Circles/Pumps: AROM, Both, 10 reps, Supine Gluteal Sets:  Strengthening, Both, 10 reps, Seated Long Arc Quad: AROM, Both, 10 reps, Seated    General Comments General comments (skin integrity, edema, etc.): SPo2 93% on RA, HR 88, BP 157/88      Pertinent Vitals/Pain Pain Assessment Pain Assessment: No/denies pain    Home Living                          Prior Function             PT Goals (current goals can now be found in the care plan section) Acute Rehab PT Goals Patient Stated Goal: feel better PT Goal Formulation: With patient/family Time For Goal Achievement: 11/25/23 Potential to Achieve Goals: Good Progress towards PT goals: Progressing toward goals    Frequency    Min 3X/week      PT Plan      Co-evaluation PT/OT/SLP Co-Evaluation/Treatment: Yes Reason for Co-Treatment: For patient/therapist safety;To address functional/ADL transfers PT goals addressed during session: Mobility/safety with mobility;Balance;Proper use of DME;Strengthening/ROM OT goals addressed during session: ADL's and self-care      AM-PAC PT 6 Clicks Mobility   Outcome Measure  Help needed turning from your back to your side while in a flat bed without using bedrails?: A Little Help needed moving from lying on your back to sitting on the side of a flat bed without using bedrails?: A Little Help needed moving to and from a bed to a chair (including a wheelchair)?: A Little Help needed standing up from a chair using your arms (e.g., wheelchair or bedside chair)?: A Lot Help needed to walk in hospital room?: A Lot Help needed climbing 3-5 steps with a railing? : A Lot 6 Click Score: 15    End of Session Equipment Utilized During Treatment: Gait belt Activity Tolerance: Patient tolerated treatment well Patient left: in chair;with call bell/phone within reach;with nursing/sitter in room Nurse Communication: Mobility status PT Visit Diagnosis: Unsteadiness on feet (R26.81);Muscle weakness (generalized) (M62.81);Difficulty in walking, not elsewhere classified (R26.2);Other abnormalities of gait and mobility (R26.89)     Time: 9085-9057 PT Time Calculation (min) (ACUTE ONLY): 28 min  Charges:    $Therapeutic Activity: 8-22 mins PT General Charges $$ ACUTE PT VISIT: 1 Visit                     Leontine Roads, PT, DPT [facility]  Rehabilitation  Services Physical Therapist Office: [phone] Website: [email or web address]    Leontine GORMAN Roads 11/15/2023, 11:02 AM

## 2023-11-15 NOTE — Progress Notes (Signed)
 Notified Dr Marylouise w/ vascular on call to clarify what bp parameters he wanted to treat for systolic. States <160 systolic . Also notified of afibb w/ rate up to 140s and about the arterial  line not coordinating with the cuff press and reading much higher than the cuff pressure.

## 2023-11-15 NOTE — PMR Pre-admission (Shared)
 PMR Admission Coordinator Pre-Admission Assessment  Patient: Jack [name] is an 78 y.o., male MRN: [id number] DOB: 1945/07/02 Height: 5' 10 (177.8 cm) Weight: 61 kg  Insurance Information HMO: yes    PPO:      PCP:      IPA:      80/20:      OTHER:  PRIMARY: [employer]       Policy#: [id number]      Subscriber: patient CM Name: ***      Phone#: ***     Fax#: *** Pre-Cert#: ***      Employer: *** Benefits:  Phone #: ***     Name: *** Eff. Date: ***     Deduct: ***      Out of Pocket Max: ***      Life Max: *** CIR: ***      SNF: *** Outpatient: ***     Co-Pay: *** Home Health: ***      Co-Pay: *** DME: ***     Co-Pay: *** Providers: in-network SECONDARY:       Policy#:      Phone#:   Financial Counselor:       Phone#:   The Data processing manager" for patients in Inpatient Rehabilitation Facilities with attached "Privacy Act Statement-Health Care Records" was provided and verbally reviewed with: {CHL IP Patient Family WJ:695449998}  Emergency Contact Information Contact Information     Name Relation Home Work Mobile   [place] Spouse [phone]  [phone]      Other Contacts   None on File     Current Medical History  Patient Admitting Diagnosis: aortic dissection History of Present Illness: Pt is a 78 year old male with medical hx significant for: CAD, HTN, CKD, PAF, h/o left arm AV fistula and subsequent ligation, right carotid endarterectomy. Pt presented to [facility] on 11/08/23 d/t sudden onset of midthoracic back pain. EKG/troponin chest concerning for proximal descending thoracis aortic dissection. Vascular surgery consulted. Pt had urinary retention and required I&O cath. Nephrology consulted. Foley catheter placed on 6/23. KUB revealed ileus. NG placed. No renal artery stenosis noted on renal duplex. Pt had chest pain on 6/26. EKG negative for acute coronary syndrome. CT chest/abdomen/pelvis revealed interval change in  descending thoracic aorta. Pt underwent TEVAR by Dr. Magda on 11/14/23.*** Therapy evaluations completed and CIR recommended d/t pt's deficits in functional mobility.     Patient's medical record from [facility] has been reviewed by the rehabilitation admission coordinator and physician.  Past Medical History  Past Medical History:  Diagnosis Date   Allergy    Arthritis    psoriatic arthritis    Asthma    Atheroembolic kidney disease (HCC)    Atheroembolism    CAD (coronary artery disease)    hx non obst   Cancer (HCC)    basal cell x3 , last one removed - 12/12/2013, R temple area of forehead   Carotid artery occlusion    Chronic kidney disease    renal stones - tx. /w lithotripsy   Complication of anesthesia    GERD (gastroesophageal reflux disease)    HLD (hyperlipidemia)    HTN (hypertension)    Hypoglycemia    Paroxysmal atrial fibrillation (HCC)    PONV (postoperative nausea and vomiting)    Pruritus    Psoriasis    Shortness of breath    after eating , if he gets active, he might have some SOB   Vitamin D deficiency  Has the patient had major surgery during 100 days prior to admission? Yes  Family History   family history includes Asthma in his father; Cancer in his mother; Coronary artery disease in an other family member; Heart disease in his brother and father.  Current Medications  Current Facility-Administered Medications:    0.9 %  sodium chloride  infusion, 500 mL, Intravenous, Once PRN, Rhyne, Samantha J, PA-C   acetaminophen  (TYLENOL ) suppository 650 mg, 650 mg, Rectal, Q4H PRN, Rhyne, Samantha J, PA-C, 650 mg at 11/13/23 2210   acetaminophen  (TYLENOL ) tablet 650 mg, 650 mg, Per Tube, Q6H PRN, Ilah Corean HERO, PA-C   aspirin  EC tablet 81 mg, 81 mg, Oral, Q0600, Rhyne, Samantha J, PA-C, 81 mg at 11/15/23 1137   Chlorhexidine  Gluconate Cloth 2 % PADS 6 each, 6 each, Topical, Daily, Rhyne, Samantha J, PA-C, 6 each at 11/15/23 1136   diazepam   (VALIUM ) injection 2.5 mg, 2.5 mg, Intravenous, Q4H PRN, Rhyne, Samantha J, PA-C   enoxaparin (LOVENOX) injection 30 mg, 30 mg, Subcutaneous, Daily, Ilah, Stephanie M, PA-C, 30 mg at 11/15/23 1137   feeding supplement (ENSURE PLUS HIGH PROTEIN) liquid 237 mL, 237 mL, Oral, BID BM, Rhyne, Samantha J, PA-C, 237 mL at 11/12/23 0955   fentaNYL  (SUBLIMAZE ) injection 25-50 mcg, 25-50 mcg, Intravenous, Q2H PRN, Ilah, Stephanie M, PA-C   hydrALAZINE  (APRESOLINE ) injection 10 mg, 10 mg, Intravenous, Q4H PRN, Ogan, Okoronkwo U, MD, 10 mg at 11/15/23 0827   labetalol  (NORMODYNE ) infusion 5 mg/mL, 0.5-3 mg/min, Intravenous, Titrated, Rhyne, Samantha J, PA-C, Stopped at 11/14/23 1601   labetalol  (NORMODYNE ) injection 5 mg, 5 mg, Intravenous, Q2H PRN, Rayburn Pac, MD, 5 mg at 11/15/23 1358   magnesium  sulfate IVPB 2 g 50 mL, 2 g, Intravenous, Daily PRN, Rhyne, Samantha J, PA-C   Oral care mouth rinse, 15 mL, Mouth Rinse, PRN, Rhyne, Samantha J, PA-C   pantoprazole  (PROTONIX ) injection 40 mg, 40 mg, Intravenous, QHS, Rhyne, Samantha J, PA-C, 40 mg at 11/14/23 2049   phenol (CHLORASEPTIC) mouth spray 1 spray, 1 spray, Mouth/Throat, PRN, Rhyne, Samantha J, PA-C   polyethylene glycol (MIRALAX  / GLYCOLAX ) packet 17 g, 17 g, Oral, Daily PRN, Rhyne, Samantha J, PA-C   potassium chloride  SA (KLOR-CON  M) CR tablet 20-40 mEq, 20-40 mEq, Oral, Daily PRN, Rhyne, Samantha J, PA-C   promethazine  (PHENERGAN ) 12.5 mg in sodium chloride  0.9 % 50 mL IVPB, 12.5 mg, Intravenous, Q6H PRN, Rhyne, Samantha J, PA-C, Stopped at 11/14/23 0753   simethicone  (MYLICON) chewable tablet 80 mg, 80 mg, Oral, QID PRN, Rhyne, Samantha J, PA-C, 80 mg at 11/12/23 2213   traMADol  (ULTRAM ) tablet 50 mg, 50 mg, Per Tube, Q12H PRN, Ilah Corean M, PA-C   traZODone  (DESYREL ) tablet 50 mg, 50 mg, Oral, QHS PRN, Rhyne, Samantha J, PA-C, 50 mg at 11/11/23 2124  Patients Current Diet:  Diet Order             Diet clear liquid Room service  appropriate? Yes; Fluid consistency: Thin  Diet effective now                   Precautions / Restrictions Precautions Precautions: Fall Precaution/Restrictions Comments: tremors, BP, NG tube Restrictions Weight Bearing Restrictions Per Provider Order: No   Has the patient had 2 or more falls or a fall with injury in the past year? No  Prior Activity Level Community (5-7x/wk): gets out of house daily; drives  Prior Functional Level Self Care: Did the patient need help bathing, dressing,  using the toilet or eating? Needed some help  Indoor Mobility: Did the patient need assistance with walking from room to room (with or without device)? Independent  Stairs: Did the patient need assistance with internal or external stairs (with or without device)? Independent  Functional Cognition: Did the patient need help planning regular tasks such as shopping or remembering to take medications? Independent  Patient Information Are you of Hispanic, Latino/a,or Spanish origin?: A. No, not of Hispanic, Latino/a, or Spanish origin What is your race?: A. White Do you need or want an interpreter to communicate with a doctor or health care staff?: 0. No  Patient's Response To:  Health Literacy and Transportation Is the patient able to respond to health literacy and transportation needs?: Yes Health Literacy - How often do you need to have someone help you when you read instructions, pamphlets, or other written material from your doctor or pharmacy?: Never In the past 12 months, has lack of transportation kept you from medical appointments or from getting medications?: No In the past 12 months, has lack of transportation kept you from meetings, work, or from getting things needed for daily living?: No  Home Assistive Devices / Equipment Home Equipment: None  Prior Device Use: Indicate devices/aids used by the patient prior to current illness, exacerbation or injury? None of the above  Current  Functional Level Cognition  Orientation Level: Oriented X4    Extremity Assessment (includes Sensation/Coordination)  Upper Extremity Assessment: Generalized weakness RUE Deficits / Details: tremor present, edema noted at the elbow and upper arm , redness, IV site LUE Deficits / Details: aline at wrist and noted to have some drainage prior to session but not during session  Lower Extremity Assessment: Defer to PT evaluation    ADLs  Overall ADL's : Needs assistance/impaired Eating/Feeding: Minimal assistance Eating/Feeding Details (indicate cue type and reason): due to tremor wife helping support cup Grooming: Minimal assistance, Standing Grooming Details (indicate cue type and reason): pt reports feeling that legs are weak but able to static stand at sink for 5 minutes Lower Body Dressing: Total assistance Toilet Transfer: Minimal assistance, +2 for safety/equipment, +2 for physical assistance Toilet Transfer Details (indicate cue type and reason): transfer to 3n1 due to prolonged constipation without any output or passing gas at all. Pt belching loudly and repetitve upright Functional mobility during ADLs: Minimal assistance, Rolling walker (2 wheels) General ADL Comments: pt requires cues for L Aline and wrist neutral position    Mobility  Overal bed mobility: Needs Assistance Bed Mobility: Supine to Sit Supine to sit: Min assist, HOB elevated Sit to supine: Min assist Sit to sidelying: Mod assist General bed mobility comments: hob elevated and (A) to scoot to eob . pt needed cues throughout to avoid press on L wrist for extension.    Transfers  Overall transfer level: Needs assistance Equipment used: Rolling walker (2 wheels) Transfers: Sit to/from Stand Sit to Stand: +2 physical assistance, Min assist General transfer comment: Min assist for boost to stand, cues for forward weight shift, and neutral wrist due to aline.    Ambulation / Gait / Stairs / Wheelchair Mobility   Ambulation/Gait Ambulation/Gait assistance: Min assist, +2 safety/equipment Gait Distance (Feet): 35 Feet Assistive device: Rolling walker (2 wheels) Gait Pattern/deviations: Step-through pattern, Decreased stride length, Drifts right/left General Gait Details: Minor instability with min assist for balance, RW control, and + 2 for safety with management of lines/leads. Cues for proximity to device. Gait velocity: dec Gait velocity interpretation: <1.31  ft/sec, indicative of household ambulator    Posture / Balance Balance Overall balance assessment: Needs assistance Sitting-balance support: Feet supported, No upper extremity supported Sitting balance-Leahy Scale: Fair Standing balance support: Reliant on assistive device for balance, During functional activity Standing balance-Leahy Scale: Poor Standing balance comment: reliant on external support and Eva walker    Special needs/care consideration Skin Ecchymosis: arm/bilateral; Wound-contact dermatitis: sacrum/medial; Surgical incision: groin/right, Urethral Catheter and Special service needs ***   Previous Home Environment (from acute therapy documentation) Living Arrangements: Spouse/significant other  Lives With: Spouse Available Help at Discharge: Family, Available 24 hours/day Type of Home: House Home Layout: One level Home Access: Level entry Entrance Stairs-Rails: None Entrance Stairs-Number of Steps: 2 Bathroom Shower/Tub: Engineer, manufacturing systems: Standard Bathroom Accessibility: Yes How Accessible: Accessible via walker Home Care Services: No Additional Comments: married to wife of almost 50 years.  Discharge Living Setting Plans for Discharge Living Setting: Patient's home Type of Home at Discharge: House Discharge Home Layout: One level Discharge Home Access: Level entry Discharge Bathroom Shower/Tub: Tub/shower unit Discharge Bathroom Toilet: Standard Discharge Bathroom Accessibility: Yes How  Accessible: Accessible via walker Does the patient have any problems obtaining your medications?: No  Social/Family/Support Systems Anticipated Caregiver: Ephraim Reichel, wife Anticipated Caregiver's Contact Information: [phone] Caregiver Availability: 24/7 Discharge Plan Discussed with Primary Caregiver: Yes Is Caregiver In Agreement with Plan?: Yes Does Caregiver/Family have Issues with Lodging/Transportation while Pt is in Rehab?: No  Goals    Decrease burden of Care through IP rehab admission: NA  Possible need for SNF placement upon discharge: Not anticipated  Patient Condition: {PATIENT'S CONDITION:22832}  Preadmission Screen Completed By:  Tinnie SHAUNNA Yvone Delayne, 11/15/2023 2:52 PM ______________________________________________________________________   Discussed status with Dr. PIERRETTE on *** at *** and received approval for admission today.  Admission Coordinator:  Tinnie SHAUNNA Yvone Delayne, CCC-SLP, time ***/Date ***   Assessment/Plan: Diagnosis: *** Does the need for close, 24 hr/day Medical supervision in concert with the patient's rehab needs make it unreasonable for this patient to be served in a less intensive setting? {yes_no_potentially:3041433} Co-Morbidities requiring supervision/potential complications: *** Due to {due un:6958565}, does the patient require 24 hr/day rehab nursing? {yes_no_potentially:3041433} Does the patient require coordinated care of a physician, rehab nurse, PT, OT, and SLP to address physical and functional deficits in the context of the above medical diagnosis(es)? {yes_no_potentially:3041433} Addressing deficits in the following areas: {deficits:3041436} Can the patient actively participate in an intensive therapy program of at least 3 hrs of therapy 5 days a week? {yes_no_potentially:3041433} The potential for patient to make measurable gains while on inpatient rehab is {potential:3041437} Anticipated functional outcomes upon discharge from  inpatient rehab: {functional outcomes:304600100} PT, {functional outcomes:304600100} OT, {functional outcomes:304600100} SLP Estimated rehab length of stay to reach the above functional goals is: *** Anticipated discharge destination: {anticipated dc setting:21604} 10. Overall Rehab/Functional Prognosis: {potential:3041437}   MD Signature: ***

## 2023-11-16 ENCOUNTER — Inpatient Hospital Stay (HOSPITAL_COMMUNITY)

## 2023-11-16 DIAGNOSIS — K567 Ileus, unspecified: Secondary | ICD-10-CM | POA: Diagnosis not present

## 2023-11-16 DIAGNOSIS — N179 Acute kidney failure, unspecified: Secondary | ICD-10-CM | POA: Diagnosis not present

## 2023-11-16 DIAGNOSIS — I161 Hypertensive emergency: Secondary | ICD-10-CM | POA: Diagnosis not present

## 2023-11-16 DIAGNOSIS — I71012 Dissection of descending thoracic aorta: Secondary | ICD-10-CM | POA: Diagnosis not present

## 2023-11-16 LAB — BASIC METABOLIC PANEL WITH GFR
Anion gap: 14 (ref 5–15)
BUN: 49 mg/dL — ABNORMAL HIGH (ref 8–23)
CO2: 23 mmol/L (ref 22–32)
Calcium: 8.8 mg/dL — ABNORMAL LOW (ref 8.9–10.3)
Chloride: 103 mmol/L (ref 98–111)
Creatinine, Ser: 2.13 mg/dL — ABNORMAL HIGH (ref 0.61–1.24)
GFR, Estimated: 31 mL/min — ABNORMAL LOW (ref >60)
Glucose, Bld: 106 mg/dL — ABNORMAL HIGH (ref 70–99)
Potassium: 3.5 mmol/L (ref 3.5–5.1)
Sodium: 140 mmol/L (ref 135–145)

## 2023-11-16 LAB — CBC
HCT: 43.2 % (ref 39.0–52.0)
Hemoglobin: 14.7 g/dL (ref 13.0–17.0)
MCH: 30.6 pg (ref 26.0–34.0)
MCHC: 34 g/dL (ref 30.0–36.0)
MCV: 90 fL (ref 80.0–100.0)
Platelets: 234 10*3/uL (ref 150–400)
RBC: 4.8 MIL/uL (ref 4.22–5.81)
RDW: 13.5 % (ref 11.5–15.5)
WBC: 9.3 10*3/uL (ref 4.0–10.5)
nRBC: 0 % (ref 0.0–0.2)

## 2023-11-16 LAB — POCT ACTIVATED CLOTTING TIME
Activated Clotting Time: 239 s
Activated Clotting Time: 297 s

## 2023-11-16 LAB — MAGNESIUM: Magnesium: 2.5 mg/dL — ABNORMAL HIGH (ref 1.7–2.4)

## 2023-11-16 LAB — PHOSPHORUS: Phosphorus: 2.9 mg/dL (ref 2.5–4.6)

## 2023-11-16 MED ORDER — TAMSULOSIN HCL 0.4 MG PO CAPS
0.4000 mg | ORAL_CAPSULE | Freq: Every day | ORAL | Status: DC
  Administered 2023-11-16 – 2023-11-20 (×5): 0.4 mg via ORAL
  Filled 2023-11-16 (×5): qty 1

## 2023-11-16 MED ORDER — DIAZEPAM 5 MG PO TABS: 2.5000 mg | ORAL_TABLET | ORAL | Status: DC | PRN

## 2023-11-16 MED ORDER — SMOG ENEMA
960.0000 mL | Freq: Once | RECTAL | Status: AC
  Administered 2023-11-16: 960 mL via RECTAL
  Filled 2023-11-16: qty 960

## 2023-11-16 MED ORDER — POTASSIUM CHLORIDE 10 MEQ/100ML IV SOLN
INTRAVENOUS | Status: AC
  Administered 2023-11-16: 10 meq
  Filled 2023-11-16: qty 100

## 2023-11-16 MED ORDER — POTASSIUM CHLORIDE 10 MEQ/50ML IV SOLN
10.0000 meq | INTRAVENOUS | Status: AC
  Administered 2023-11-16 (×6): 10 meq via INTRAVENOUS
  Filled 2023-11-16 (×7): qty 50

## 2023-11-16 MED ORDER — METOCLOPRAMIDE HCL 5 MG/ML IJ SOLN
10.0000 mg | Freq: Four times a day (QID) | INTRAMUSCULAR | Status: AC
  Administered 2023-11-16 – 2023-11-17 (×4): 10 mg via INTRAVENOUS
  Filled 2023-11-16 (×4): qty 2

## 2023-11-16 MED ORDER — POTASSIUM CHLORIDE 10 MEQ/100ML IV SOLN: 10.0000 meq | INTRAVENOUS | Status: DC

## 2023-11-16 NOTE — Progress Notes (Signed)
 Patient brought to 4E from 2H. VSS. Telemetry box applied, CCMD notified. Patient oriented to room and staff. Call bell in reach. ? ?Kenard Gower, RN  ?

## 2023-11-16 NOTE — Progress Notes (Signed)
 NAME:  Jack [name], MRN:  [id number], DOB:  01/04/1946, LOS: 7 ADMISSION DATE:  11/08/2023, CONSULTATION DATE:  11/09/2023 REFERRING MD: Kommor - EDP, CHIEF COMPLAINT:   Back pain   History of Present Illness:  Jack [name] is a 78 y.o. male with a PMH significant for CAD, CKD, HLD, HTN, PAF, and asthma who presented to the ED with complaints of sudden onset upper back pain. On ED arrival patient was seen hypertensive with BP 180/120, all other vital signs within normal.  Lab work significant for glucose 118, BUN 33, creatinine 190 with GFR 36, all other vital signs within normal limits.  Given concern for possible dissection MR angio chest was obtained and concerning for proximal descending thoracic aorta dissection.  Given AKI CT chest without contrast was also obtained and again concerning for intramural hematoma or contained perforation.  Patient was started on esmolol  and nicardipine  drips with PCCM and vascular consults.  Pertinent Medical History:  CAD, CKD, HLD, HTN, PAF, and asthma   Significant Hospital Events: Including procedures, antibiotic start and stop dates in addition to other pertinent events   6/21 Presented with upper back pain imaging concerning for aortic dissection admitted to ICU for esmolol  and nicardipine  drips 6/23 Esmolol  changed to labetalol , esmolol  changed to cleviprex , labetalol  PO dose increased 6/24 Nausea/ileus, NGT placed in PM with emesis. 6/25 PO antihypertensives transitioned to labetalol  gtt. 6/26 Early AM severe CP/back pain prompting repeat CT; concern for contained perf versus intraluminal hematoma. L radial A-line placed for BP monitoring. Taken to OR by VVS for TEVAR.   Interim History / Subjective:  No overnight issues NG tube output was 2.1 L Denies abdominal pain but complaining of abdominal distention  Tolerating clear liquid diet  Objective:   Blood pressure (!) 141/95, pulse (!) 109, temperature 97.9 F (36.6 C), temperature  source Oral, resp. rate 11, height 5' 10 (1.778 m), weight 50.4 kg, SpO2 91%.        Intake/Output Summary (Last 24 hours) at 11/16/2023 1150 Last data filed at 11/16/2023 0800 Gross per 24 hour  Intake 65.03 ml  Output 2830 ml  Net -2764.97 ml   Filed Weights   11/14/23 0904 11/15/23 0421 11/16/23 0500  Weight: 66.7 kg 61 kg 50.4 kg   Physical Examination: General: Elderly male, lying on the bed HEENT: [place]/AT, eyes anicteric.  moist mucus membranes.  NGT in place Neuro: Alert, awake following commands Chest: Coarse breath sounds, no wheezes or rhonchi Heart: Regular rate and rhythm, no murmurs or gallops Abdomen: Soft, nontender, distended, sluggish bowel sounds present  Labs and images reviewed  Patient Lines/Drains/Airways Status     Active Line/Drains/Airways     Name Placement date Placement time Site Days   CVC Double Lumen 11/14/23 Left Internal jugular 11/14/23  1113  -- 2   NG/OG Vented/Dual Lumen Left nare Marking at nare/corner of mouth 61 cm 11/13/23  0155  Left nare  3   Urethral Catheter [facility] RN Latex 16 Fr. 11/11/23  1838  Latex  5   Wound 11/13/23 0015 Irritant Contact Dermatitis Sacrum Medial 11/13/23  0015  Sacrum  3   Wound 11/14/23 1111 Surgical Closed Surgical Incision Groin Right 11/14/23  1111  Groin  2         Assessment and Plan:  Acute type B aortic dissection status post TEVAR Hypertensive emergency, improved Coronary artery disease Hyperlipidemia Hypertension AKI on CKD stage IIIb, improving Paroxysmal A-fib with RVR Acute urinary retention  Ileus  Appreciate vascular surgery follow-up Patient will need repeat CT scan in 4 to 6 weeks Maintain SBP less than 160 Continue as needed labetalol  Started on oral labetalol  100 mg 3 times daily Continue multimodality pain management Remain in sinus rhythm Continue aspirin  Serum creatinine continue to trend down, avoid nephrotoxic agent He has solitary kidney Started on Flomax  He  had a small bowel movement yesterday with rectal suppository Will give him smog enema today Continue aggressive bowel regimen Continue PT/OT evaluation  Best Practice (right click and Reselect all SmartList Selections daily)   Diet/type: Clear liquid diet DVT prophylaxis subcu Lovenox Pressure ulcer(s): Refer to nursing notes GI prophylaxis: PPI Lines: Discontinue central line Foley: Still required Code Status:  full code Last date of multidisciplinary goals of care discussion: 6/27 - Patient/wife updated at bedside     Jack Novas, MD [facility] See Amion for pager If no response to pager, please call [phone] until 7pm After 7pm, Please call E-link [phone]

## 2023-11-16 NOTE — Plan of Care (Signed)

## 2023-11-16 NOTE — Progress Notes (Signed)
 Patient ID: Jack [name], male   DOB: [birth year], 78 y.o.   MRN: [id number] S: Not feeling well this morning.  Complaining of abdominal discomfort. O:BP (!) 134/91   Pulse (!) 104   Temp 98.1 F (36.7 C) (Oral)   Resp 13   Ht 5' 10 (1.778 m)   Wt 50.4 kg   SpO2 93%   BMI 15.94 kg/m   Intake/Output Summary (Last 24 hours) at 11/16/2023 0813 Last data filed at 11/16/2023 0615 Gross per 24 hour  Intake 83.67 ml  Output 2830 ml  Net -2746.33 ml   Intake/Output: I/O last 3 completed shifts: In: 177 [I.V.:93.4; NG/GT:30; IV Piggyback:53.7] Out: 3790 [Urine:1690; Emesis/NG output:2100]  Intake/Output this shift:  No intake/output data recorded. Weight change: -16.3 kg Gen:NAD RCD:ujryb at 104 Resp: CTA Abd: Distended, hypoactive bowel sounds, mildly tender Ext: no edema  Recent Labs  Lab 11/10/23 0226 11/11/23 0238 11/11/23 1101 11/12/23 0311 11/13/23 0348 11/14/23 0301 11/14/23 1236 11/15/23 0206 11/15/23 1403 11/16/23 0339  NA 134*   < >  --  127* 129* 134* 137 137 137 140  K 4.2   < >  --  5.4* 4.0 4.1 3.8 3.5 3.9 3.5  CL 108   < >  --  103 96* 98 97* 99 100 103  CO2 21*   < >  --  14* 23 19* 22 24 23 23   GLUCOSE 113*   < >  --  110* 115* 90 118* 96 114* 106*  BUN 31*   < >  --  43* 51* 58* 58* 55* 53* 49*  CREATININE 2.37*   < >  --  2.86* 3.23* 3.08* 3.02* 2.57* 2.59* 2.13*  ALBUMIN  --   --  2.6*  --   --   --   --   --   --   --   CALCIUM  8.2*   < >  --  8.5* 8.9 9.1 8.7* 8.6* 8.7* 8.8*  PHOS 3.2  --   --  4.8* 6.8* 5.8*  --  4.4  --  2.9  AST  --   --  14*  --   --   --   --   --   --   --   ALT  --   --  14  --   --   --   --   --   --   --    < > = values in this interval not displayed.   Liver Function Tests: Recent Labs  Lab 11/11/23 1101  AST 14*  ALT 14  ALKPHOS 55  BILITOT 0.9  PROT 5.3*  ALBUMIN 2.6*   No results for input(s): LIPASE, AMYLASE in the last 168 hours. Recent Labs  Lab 11/11/23 1101  AMMONIA 25   CBC: Recent Labs   Lab 11/13/23 0348 11/14/23 0518 11/14/23 1236 11/15/23 0206 11/16/23 0339  WBC 7.8 5.7 6.7 6.1 9.3  HGB 14.0 14.9 14.1 14.6 14.7  HCT 39.6 42.7 40.2 41.6 43.2  MCV 85.7 87.7 88.5 88.9 90.0  PLT 223 250 229 213 234   Cardiac Enzymes: No results for input(s): CKTOTAL, CKMB, CKMBINDEX, TROPONINI in the last 168 hours. CBG: Recent Labs  Lab 11/09/23 1414 11/10/23 0854 11/12/23 2312 11/14/23 0755 11/14/23 1417  GLUCAP 102* 125* 141* 108* 110*    Iron Studies: No results for input(s): IRON, TIBC, TRANSFERRIN, FERRITIN in the last 72 hours. Studies/Results: DG Chest Port 1 View Result Date: 11/14/2023  CLINICAL DATA:  Graft placement EXAM: PORTABLE CHEST 1 VIEW COMPARISON:  Chest x-ray 11/11/2023 FINDINGS: New left-sided central venous catheter tip ends at the brachiocephalic SVC junction. Enteric tube extends below the diaphragm. There is a new endovascular graft in the descending thoracic aorta. The thoracic aorta is ectatic. The heart is enlarged. There is a small right pleural effusion. The lungs are otherwise clear. There is no pneumothorax or focal lung infiltrate. No acute osseous abnormality. IMPRESSION: 1. New left-sided central venous catheter with distal tip ending at the brachiocephalic SVC junction. 2. New endovascular graft in the descending thoracic aorta. 3. Small right pleural effusion. 4. Cardiomegaly. Electronically Signed   By: Greig Pique M.D.   On: 11/14/2023 16:56   HYBRID OR IMAGING ([facility] ONLY) Result Date: 11/14/2023 There is no interpretation for this exam.  This order is for images obtained during a surgical procedure.  Please See Surgeries Tab for more information regarding the procedure.    amLODipine   5 mg Per Tube QPM   aspirin  EC  81 mg Oral Q0600   Chlorhexidine  Gluconate Cloth  6 each Topical Daily   enoxaparin (LOVENOX) injection  30 mg Subcutaneous Daily   feeding supplement  237 mL Oral BID BM   labetalol   100 mg Per Tube BID    pantoprazole  (PROTONIX ) IV  40 mg Intravenous QHS    BMET    Component Value Date/Time   NA 140 11/16/2023 0339   K 3.5 11/16/2023 0339   CL 103 11/16/2023 0339   CO2 23 11/16/2023 0339   GLUCOSE 106 (H) 11/16/2023 0339   BUN 49 (H) 11/16/2023 0339   CREATININE 2.13 (H) 11/16/2023 0339   CALCIUM  8.8 (L) 11/16/2023 0339   GFRNONAA 31 (L) 11/16/2023 0339   GFRAA 56 (L) 12/24/2013 1316   CBC    Component Value Date/Time   WBC 9.3 11/16/2023 0339   RBC 4.80 11/16/2023 0339   HGB 14.7 11/16/2023 0339   HCT 43.2 11/16/2023 0339   PLT 234 11/16/2023 0339   MCV 90.0 11/16/2023 0339   MCH 30.6 11/16/2023 0339   MCHC 34.0 11/16/2023 0339   RDW 13.5 11/16/2023 0339   LYMPHSABS 2.3 11/08/2023 2111   MONOABS 1.2 (H) 11/08/2023 2111   EOSABS 0.3 11/08/2023 2111   BASOSABS 0.1 11/08/2023 2111    Assessment/Plan:   AKI/CKD stage IIIb - baseline Scr around 1.9-2.  He has a solitary functioning kidney on the left due to chronic UPJ obstruction on the right.  Bump in Scr is likely hemodynamically mediated with rapid drop in Bp due to aortic dissection.  Also possible contribution from urinary retention.  UOP has dropped over the last 24 hours.  Agree with foley catheter and hold off on lasix  for now.  His Scr may continue to climb over the next 24-48 hours.  Maintain MAP of 65 or greater.  Currently no uremic symptoms or urgent indication for dialysis at this time.  Started on IV lasix  80 mg bid with improvement of hyperkalemia and UOP.  Unfortunately, he now has developed ileus/partial SBO with N/V.  Stopped IV lasix  on 11/13/23 and will follow.  Thankfully his Scr continues to improve closer to his baseline of 1.8-2.      Avoid nephrotoxic medications including NSAIDs and iodinated intravenous contrast exposure unless the latter is absolutely indicated.   Preferred narcotic agents for pain control are hydromorphone , fentanyl , and methadone. Morphine  should not be used.  Avoid Baclofen and avoid  oral sodium phosphate  and  magnesium  citrate based laxatives / bowel preps.  Continue strict Input and Output monitoring. Will monitor the patient closely with you and intervene or adjust therapy as indicated by changes in clinical status/labs  Acute aortic syndrome - concerning for aortic dissection type B.  Vascular surgery is following.  Given changes to CT, he underwent TEVAR on 11/14/23 by Dr. Magda without complications.   Hypertensive emergency  - improved with esmolol  and nicardipine , then clonidine  and cleviprex .  Was on IV labetalol  and hydralazine , however labetalol  was discontinued for unclear reasons. Blood pressure and heart rate have been elevated since surgery.  Will stop metoprolol  and resume IV labetalol  for SBP >130 and/or HR >90 given h/o a fib and RVR last night (and also per wife's request).  Started on labetalol  100 mg per NGT bid. Ileus vs partial SBO - has had refractory N/V and NGT placed with some improvement of symptoms. Chest pain - awaiting EKG and troponins NAGMA - agree with starting sodium bicarb tablets and follow.  Paroxysmal atrial fibrillation - long term flecanide, not on anticoagulation. Urinary retention - agree with foley catheter Hyponatremia - mild and normal Sosm.  Likely due to AKI and impaired free water excretion.  Improved with lasix .  Continue to follow.  Hyperkalemia - started lokelma  10 gm daily and lasix  as above.  Resting tremors - apparently new during this admission.  Do not think AKI is related to this.  Continue to follow. Appears to have resolved  Fairy RONAL Sellar, MD [facility]

## 2023-11-17 DIAGNOSIS — N1832 Chronic kidney disease, stage 3b: Secondary | ICD-10-CM | POA: Diagnosis not present

## 2023-11-17 DIAGNOSIS — K219 Gastro-esophageal reflux disease without esophagitis: Secondary | ICD-10-CM

## 2023-11-17 DIAGNOSIS — I71012 Dissection of descending thoracic aorta: Secondary | ICD-10-CM | POA: Diagnosis not present

## 2023-11-17 DIAGNOSIS — I10 Essential (primary) hypertension: Secondary | ICD-10-CM

## 2023-11-17 DIAGNOSIS — K567 Ileus, unspecified: Secondary | ICD-10-CM

## 2023-11-17 DIAGNOSIS — I480 Paroxysmal atrial fibrillation: Secondary | ICD-10-CM | POA: Diagnosis not present

## 2023-11-17 LAB — BASIC METABOLIC PANEL WITH GFR
Anion gap: 14 (ref 5–15)
BUN: 39 mg/dL — ABNORMAL HIGH (ref 8–23)
CO2: 24 mmol/L (ref 22–32)
Calcium: 8.6 mg/dL — ABNORMAL LOW (ref 8.9–10.3)
Chloride: 104 mmol/L (ref 98–111)
Creatinine, Ser: 1.72 mg/dL — ABNORMAL HIGH (ref 0.61–1.24)
GFR, Estimated: 40 mL/min — ABNORMAL LOW (ref >60)
Glucose, Bld: 109 mg/dL — ABNORMAL HIGH (ref 70–99)
Potassium: 3.5 mmol/L (ref 3.5–5.1)
Sodium: 142 mmol/L (ref 135–145)

## 2023-11-17 MED ORDER — PANTOPRAZOLE SODIUM 40 MG PO TBEC
40.0000 mg | DELAYED_RELEASE_TABLET | Freq: Two times a day (BID) | ORAL | Status: DC
  Administered 2023-11-17 – 2023-11-20 (×7): 40 mg via ORAL
  Filled 2023-11-17 (×7): qty 1

## 2023-11-17 MED ORDER — AMLODIPINE BESYLATE 10 MG PO TABS
10.0000 mg | ORAL_TABLET | Freq: Every evening | ORAL | Status: DC
  Administered 2023-11-17: 10 mg
  Filled 2023-11-17 (×2): qty 1

## 2023-11-17 MED ORDER — LABETALOL HCL 200 MG PO TABS
200.0000 mg | ORAL_TABLET | Freq: Two times a day (BID) | ORAL | Status: DC
  Administered 2023-11-17: 200 mg
  Filled 2023-11-17: qty 1

## 2023-11-17 MED ORDER — MENTHOL 3 MG MT LOZG
1.0000 | LOZENGE | OROMUCOSAL | Status: DC | PRN
  Filled 2023-11-17: qty 9

## 2023-11-17 NOTE — Assessment & Plan Note (Addendum)
 Hypertensive emergency on admission.  Blood pressure has been increasing up to 173/101  Plan to increase amlodipine  to 10 mg and continue labetalol  200 mg bid per tube.  As needed IV hydralazine  and labetalol .

## 2023-11-17 NOTE — Assessment & Plan Note (Signed)
 AKI  Renal function with serum cr at 1.72 with K at 3,5 and serum bicarbonate at 24  Na 142  Plan to continue supportive care Avoid nephrotoxic medications.  Follow up renal function and electrolytes in am.

## 2023-11-17 NOTE — Assessment & Plan Note (Signed)
 This morning continue in atrial fibrillation with RVR with rate 120 bpm range.   Amiodarone has stopped and resumed on Flecainide .  Continue IV heparin  for anticoagulation, possible transition to DOAC.

## 2023-11-17 NOTE — Assessment & Plan Note (Signed)
 Continue pantoprazole.

## 2023-11-17 NOTE — Hospital Course (Signed)
 Mr. Jantz was admitted to the hospital with acute aortic syndrome, aortic dissection type B with hypertensive emergency.    78 y.o. male with a PMH significant for CAD, CKD, HLD, HTN, PAF, and asthma who presented to the ED with complaints of sudden onset upper back pain. Severe pain that occurred while he was seating watching TV., EMS was called and patient was transported to the ED.  On ED arrival patient was seen hypertensive with BP 180/120, HR 59, RR 11 and 02 saturation 98% Lungs with no wheezing or rales, heart with S1 and S2 present and regular with no gallops, rubs or murmurs, abdomen with no distention, non tender and soft to palpation, no lower extremity edema.   Na 140, K 3,8 Cl 103 bicarbonate 25, glucose 118 bun 33 cr 1,90  BNP 523  High sensitive troponin 6 and 6  Wbc 8,5 hgb 16,2 plt 251   Chest radiograph with hypoinflation, no cardiomegaly, left base atelectasis.   EKG 83 bpm, normal axis, normal intervals, qtc 448, sinus rhythm with no significant ST segment or T wave changes.   Chest abdomen MRI with contained extramural collection of T1 bright material within the proximal descending thoracic aorta measuring up to 4.2 cm in greatest dimension. Possible contained perforation.  Distal descending thoracic aorta measuring up to 4,8 cm in greatest dimension.  Marked right hydronephrosis, likely related to a congential right UPJ obstruction, with no significant residual right renal parenchyma identified.   Ct chest non contrast, with focal area of attenuation peripheral to atherosclerotic aorta along the posterior and lateral aspect of the proximal descending thoracic aorta. Possible focal intramural hematoma or contained perforation.  Aneurysmal dilatation of the descending thoracic aorta measuring 4.4 cm at the level of the hiatus.  Severe chronic right hydronephrosis with diffuse right renal parenchymal atrophy.  5 mm peri- fissural nodule alone the minor fissure.   Patient  was started on esmolol  and nicardipine  drips. 6/21 Presented with upper back pain imaging concerning for aortic dissection admitted to ICU for esmolol  and nicardipine  drips 6/23 Esmolol  changed to labetalol , esmolol  changed to cleviprex , labetalol  PO dose increased 6/24 Nausea/ileus, NGT placed in PM with emesis. 6/25 PO antihypertensives transitioned to labetalol  gtt. 6/26 Early AM severe CP/back pain prompting repeat CT; concern for contained perf versus intraluminal hematoma. L radial A-line placed for BP monitoring. Taken to OR by [facility] for thoracic endovascular aortic repair.   Patient developed ileus/ partial small bowel obstruction.   06/29 transfer to [facility].  06/30 chest and abdominal pain with positive EKG changes, noted atrial fibrillation with RVR. Placed on amiodarone drip.  07/01 clinically improving ileus, continue atrial fibrillation with RVR.

## 2023-11-17 NOTE — Progress Notes (Addendum)
 Progress Note   Patient: Jack [name] FMW:[id number] DOB: 25-Oct-1945 DOA: 11/08/2023     8 DOS: the patient was seen and examined on 11/17/2023   Brief hospital course: Jack [name] was admitted to the hospital with acute aortic syndrome, aortic dissection type B with hypertensive emergency.    78 y.o. male with a PMH significant for CAD, CKD, HLD, HTN, PAF, and asthma who presented to the ED with complaints of sudden onset upper back pain. On ED arrival patient was seen hypertensive with BP 180/120, all other vital signs within normal.  Lab work significant for glucose 118, BUN 33, creatinine 190 with GFR 36, all other vital signs within normal limits.  Given concern for possible dissection MR angio chest was obtained and concerning for proximal descending thoracic aorta dissection.  Given AKI CT chest without contrast was also obtained and again concerning for intramural hematoma or contained perforation.  Patient was started on esmolol  and nicardipine  drips with PCCM and vascular consults.   6/21 Presented with upper back pain imaging concerning for aortic dissection admitted to ICU for esmolol  and nicardipine  drips 6/23 Esmolol  changed to labetalol , esmolol  changed to cleviprex , labetalol  PO dose increased 6/24 Nausea/ileus, NGT placed in PM with emesis. 6/25 PO antihypertensives transitioned to labetalol  gtt. 6/26 Early AM severe CP/back pain prompting repeat CT; concern for contained perf versus intraluminal hematoma. L radial A-line placed for BP monitoring. Taken to OR by [facility] for thoracic endovascular aortic repair.   Patient developed ileus/ partial small bowel obstruction.   06/29 transfer to [facility].   Assessment and Plan: * Aortic dissection (HCC) Sp thoracic endovascular aortic repair for descending thoracic intramural hematoma.   Continue antiplatelet therapy and blood pressure control He has developed a post operative ileus.  Post op care per vascular surgery   Essential  hypertension Hypertensive emergency on admission.  Blood pressure has been increasing up to 173/101  Plan to increase amlodipine  to 10 mg and continue labetalol  200 mg bid per tube.  As needed IV hydralazine  and labetalol .    Chronic kidney disease, stage 3b (HCC) AKI  Renal function with serum cr at 1.72 with K at 3,5 and serum bicarbonate at 24  Na 142  Plan to continue supportive care Avoid nephrotoxic medications.  Follow up renal function and electrolytes in am.   PAF (paroxysmal atrial fibrillation) (HCC) Continue with labetalol , patient of anticoagulation  Continue telemetry monitoring   Ileus [facility]) Patient with positive bowel movement.  NG tube in place and continue to have high output, if output decreases will attempt to clamp tube tomorrow.  Continue supportive medical care.   GERD Continue pantoprazole          Subjective: Patient continue with Ng tube in place, nausea has improved, and positive bowel movement, he has high output per NG today.   Physical Exam: Vitals:   11/17/23 0009 11/17/23 0341 11/17/23 0809 11/17/23 1423  BP: (!) 154/94 (!) 173/98 (!) 163/106 (!) 173/101  Pulse: 100 (!) 102 (!) 112 (!) 103  Resp: 20 20 12 15   Temp: 98.5 F (36.9 C) 97.6 F (36.4 C) 97.7 F (36.5 C) 98.2 F (36.8 C)  TempSrc: Oral Oral Oral Oral  SpO2: 93% 94% 93% 96%  Weight:      Height:       Neurology awake and alert ENT with mild pallor, NG tube is in place  Cardiovascular with S1 and S2 present and regular with no gallops, rubs or murmurs Respiratory with no  rales or wheezing, no rhonchi  Abdomen with mild distention, non tender and not distended., No lower extremity edema Foley in placed Left internal jugular vein central line in place  Data Reviewed:    Family Communication: I spoke with Jack [name] at the bedside, we talked in detail about Jack condition, plan of care and prognosis and all questions were  addressed.   Disposition: Status is: Inpatient Remains inpatient appropriate because: recovering from acute aortic syndrome   Planned Discharge Destination: Home    Author: Elidia Toribio Furnace, MD 11/17/2023 2:51 PM  For on call review [email or web address].

## 2023-11-17 NOTE — Assessment & Plan Note (Signed)
>>  ASSESSMENT AND PLAN FOR PAF (PAROXYSMAL ATRIAL FIBRILLATION) (HCC) WRITTEN ON 11/19/2023  9:43 AM BY ARRIEN, MAURICIO DANIEL, MD  This morning continue in atrial fibrillation with RVR with rate 120 bpm range.   Amiodarone  has stopped and resumed on Flecainide .  Continue IV heparin  for anticoagulation, possible transition to DOAC.

## 2023-11-17 NOTE — Assessment & Plan Note (Addendum)
 Sp thoracic endovascular aortic repair for descending thoracic intramural hematoma.   Continue antiplatelet therapy and blood pressure control He has developed a post operative ileus.  Post op care per vascular surgery

## 2023-11-17 NOTE — Progress Notes (Signed)
  Progress Note    11/17/2023 8:40 AM 3 Days Post-Op  Subjective:  would like NG tube out  Vitals:   11/17/23 0341 11/17/23 0809  BP: (!) 173/98 (!) 163/106  Pulse: (!) 102 (!) 112  Resp: 20 12  Temp: 97.6 F (36.4 C) 97.7 F (36.5 C)  SpO2: 94% 93%    Physical Exam: General:  no distress Cardiac:  regular Lungs:  non labored Incisions:  right groin access site without hematoma Vasc:  palpable DP pulses bilaterally   CBC    Component Value Date/Time   WBC 9.3 11/16/2023 0339   RBC 4.80 11/16/2023 0339   HGB 14.7 11/16/2023 0339   HCT 43.2 11/16/2023 0339   PLT 234 11/16/2023 0339   MCV 90.0 11/16/2023 0339   MCH 30.6 11/16/2023 0339   MCHC 34.0 11/16/2023 0339   RDW 13.5 11/16/2023 0339   LYMPHSABS 2.3 11/08/2023 2111   MONOABS 1.2 (H) 11/08/2023 2111   EOSABS 0.3 11/08/2023 2111   BASOSABS 0.1 11/08/2023 2111    BMET    Component Value Date/Time   NA 140 11/16/2023 0339   K 3.5 11/16/2023 0339   CL 103 11/16/2023 0339   CO2 23 11/16/2023 0339   GLUCOSE 106 (H) 11/16/2023 0339   BUN 49 (H) 11/16/2023 0339   CREATININE 2.13 (H) 11/16/2023 0339   CALCIUM  8.8 (L) 11/16/2023 0339   GFRNONAA 31 (L) 11/16/2023 0339   GFRAA 56 (L) 12/24/2013 1316    INR    Component Value Date/Time   INR 1.2 11/14/2023 1236     Intake/Output Summary (Last 24 hours) at 11/17/2023 0840 Last data filed at 11/17/2023 0617 Gross per 24 hour  Intake 243.59 ml  Output 1700 ml  Net -1456.41 ml      Assessment/Plan:  78 y.o. male is s/p:  TEVAR for contained rupture of descending thoracic intramural hematoma on 11/14/2023 by Dr. Magda. 3 Days Post-Op  -recovered well, no concerns from surgical perspective -he has had a bowel movement and tolerating clears -continue aspirin  Will have follow up in 4 weeks   Norman GORMAN Serve MD [facility] Phone Number: [phone] 11/17/2023 8:41 AM

## 2023-11-17 NOTE — Assessment & Plan Note (Signed)
 Patient with positive bowel movement.  Will clamp tube today and follow up abdominal radiograph, if no nausea or vomiting, will plan to remove NG tube today. He has tolerating clears and having bowel function . Continue antiacid therapy with pantoprazole  and will add sucralfate. Continue as needed analgesics and antiemetics.

## 2023-11-17 NOTE — Progress Notes (Signed)
 Patient ID: Jack [name], male   DOB: 02/06/1946, 78 y.o.   MRN: [id number] S: Feeling better.  Had a bowel movement. O:BP (!) 163/106   Pulse (!) 112   Temp 97.7 F (36.5 C) (Oral)   Resp 12   Ht 5' 10 (1.778 m)   Wt 50.4 kg   SpO2 93%   BMI 15.94 kg/m   Intake/Output Summary (Last 24 hours) at 11/17/2023 1010 Last data filed at 11/17/2023 0617 Gross per 24 hour  Intake 243.59 ml  Output 1700 ml  Net -1456.41 ml   Intake/Output: I/O last 3 completed shifts: In: 393.6 [P.O.:120; NG/GT:30; IV Piggyback:243.6] Out: 4030 [Urine:1280; Emesis/NG output:2750]  Intake/Output this shift:  No intake/output data recorded. Weight change:  Gen: NAD CVS: tachy at 112 Resp: CTA Abd: +BS, distended, nontender Ext: no edema  Recent Labs  Lab 11/11/23 1101 11/12/23 0311 11/13/23 0348 11/14/23 0301 11/14/23 1236 11/15/23 0206 11/15/23 1403 11/16/23 0339  NA  --  127* 129* 134* 137 137 137 140  K  --  5.4* 4.0 4.1 3.8 3.5 3.9 3.5  CL  --  103 96* 98 97* 99 100 103  CO2  --  14* 23 19* 22 24 23 23   GLUCOSE  --  110* 115* 90 118* 96 114* 106*  BUN  --  43* 51* 58* 58* 55* 53* 49*  CREATININE  --  2.86* 3.23* 3.08* 3.02* 2.57* 2.59* 2.13*  ALBUMIN 2.6*  --   --   --   --   --   --   --   CALCIUM   --  8.5* 8.9 9.1 8.7* 8.6* 8.7* 8.8*  PHOS  --  4.8* 6.8* 5.8*  --  4.4  --  2.9  AST 14*  --   --   --   --   --   --   --   ALT 14  --   --   --   --   --   --   --    Liver Function Tests: Recent Labs  Lab 11/11/23 1101  AST 14*  ALT 14  ALKPHOS 55  BILITOT 0.9  PROT 5.3*  ALBUMIN 2.6*   No results for input(s): LIPASE, AMYLASE in the last 168 hours. Recent Labs  Lab 11/11/23 1101  AMMONIA 25   CBC: Recent Labs  Lab 11/13/23 0348 11/14/23 0518 11/14/23 1236 11/15/23 0206 11/16/23 0339  WBC 7.8 5.7 6.7 6.1 9.3  HGB 14.0 14.9 14.1 14.6 14.7  HCT 39.6 42.7 40.2 41.6 43.2  MCV 85.7 87.7 88.5 88.9 90.0  PLT 223 250 229 213 234   Cardiac Enzymes: No results  for input(s): CKTOTAL, CKMB, CKMBINDEX, TROPONINI in the last 168 hours. CBG: Recent Labs  Lab 11/12/23 2312 11/14/23 0755 11/14/23 1417  GLUCAP 141* 108* 110*    Iron Studies: No results for input(s): IRON, TIBC, TRANSFERRIN, FERRITIN in the last 72 hours. Studies/Results: DG Abd 1 View Result Date: 11/16/2023 CLINICAL DATA:  10323 Abdominal distension 10323 EXAM: ABDOMEN - 1 VIEW COMPARISON:  November 12, 2023, November 14, 2023 FINDINGS: Similar diffuse gaseous distension of the stomach and entire small bowel. Well-formed fecal material in the ascending colon. Minimal gas in the rectum. Esophagogastric tube terminates in the stomach. No pneumoperitoneum. No organomegaly or radiopaque calculi. No acute fracture or destructive lesion. The lung bases are clear.Endovascular aortic stent graft in the descending aorta and upper abdominal aorta. IMPRESSION: Similar diffuse gaseous distension of the stomach and entire  small bowel with minimal rectal gas present, possibly reflecting an adynamic ileus. Well-positioned esophagogastric tube terminating in the stomach. Electronically Signed   By: Rogelia Myers M.D.   On: 11/16/2023 13:08    amLODipine   5 mg Per Tube QPM   aspirin  EC  81 mg Oral Q0600   Chlorhexidine  Gluconate Cloth  6 each Topical Daily   enoxaparin (LOVENOX) injection  30 mg Subcutaneous Daily   feeding supplement  237 mL Oral BID BM   labetalol   100 mg Per Tube BID   pantoprazole   40 mg Oral BID   tamsulosin   0.4 mg Oral Daily    BMET    Component Value Date/Time   NA 140 11/16/2023 0339   K 3.5 11/16/2023 0339   CL 103 11/16/2023 0339   CO2 23 11/16/2023 0339   GLUCOSE 106 (H) 11/16/2023 0339   BUN 49 (H) 11/16/2023 0339   CREATININE 2.13 (H) 11/16/2023 0339   CALCIUM  8.8 (L) 11/16/2023 0339   GFRNONAA 31 (L) 11/16/2023 0339   GFRAA 56 (L) 12/24/2013 1316   CBC    Component Value Date/Time   WBC 9.3 11/16/2023 0339   RBC 4.80 11/16/2023 0339   HGB 14.7  11/16/2023 0339   HCT 43.2 11/16/2023 0339   PLT 234 11/16/2023 0339   MCV 90.0 11/16/2023 0339   MCH 30.6 11/16/2023 0339   MCHC 34.0 11/16/2023 0339   RDW 13.5 11/16/2023 0339   LYMPHSABS 2.3 11/08/2023 2111   MONOABS 1.2 (H) 11/08/2023 2111   EOSABS 0.3 11/08/2023 2111   BASOSABS 0.1 11/08/2023 2111    Assessment/Plan:   AKI/CKD stage IIIb - baseline Scr around 1.9-2.  He has a solitary functioning kidney on the left due to chronic UPJ obstruction on the right.  Bump in Scr is likely hemodynamically mediated with rapid drop in Bp due to aortic dissection.  Also possible contribution from urinary retention.  UOP has dropped over the last 24 hours.  Agree with foley catheter and hold off on lasix  for now.  His Scr may continue to climb over the next 24-48 hours.  Maintain MAP of 65 or greater.  Currently no uremic symptoms or urgent indication for dialysis at this time.  Started on IV lasix  80 mg bid with improvement of hyperkalemia and UOP.  Unfortunately, he now has developed ileus/partial SBO with N/V.  Stopped IV lasix  on 11/13/23 and will follow.  Thankfully his Scr continues to improve closer to his baseline of 1.8-2.  Labs pending this morning.     Avoid nephrotoxic medications including NSAIDs and iodinated intravenous contrast exposure unless the latter is absolutely indicated.   Preferred narcotic agents for pain control are hydromorphone , fentanyl , and methadone. Morphine  should not be used.  Avoid Baclofen and avoid oral sodium phosphate  and magnesium  citrate based laxatives / bowel preps.  Continue strict Input and Output monitoring. Will monitor the patient closely with you and intervene or adjust therapy as indicated by changes in clinical status/labs  Acute aortic syndrome - concerning for aortic dissection type B.  Vascular surgery is following.  Given changes to CT, he underwent TEVAR on 11/14/23 by Dr. Magda without complications.   Hypertensive emergency  - improved with  esmolol  and nicardipine , then clonidine  and cleviprex .  Was on IV labetalol  and hydralazine , however labetalol  was discontinued for unclear reasons. Blood pressure and heart rate have been elevated since surgery.  Will stop metoprolol  and resume IV labetalol  for SBP >130 and/or HR >90 given h/o a  fib and RVR last night (and also per wife's request).  Started on labetalol  100 mg per NGT bid.  BP and HR up this morning.  Will increase to 200 mg bid and follow.  Ileus vs partial SBO - has had refractory N/V and NGT placed with some improvement of symptoms. Chest pain - awaiting EKG and troponins NAGMA - agree with starting sodium bicarb tablets and follow.  Paroxysmal atrial fibrillation - long term flecanide, not on anticoagulation. Urinary retention - agree with foley catheter Hyponatremia - mild and normal Sosm.  Likely due to AKI and impaired free water excretion.  Improved with lasix .  Continue to follow.  Hyperkalemia - started lokelma  10 gm daily and lasix  as above.  Resting tremors - apparently new during this admission.  Do not think AKI is related to this.  Continue to follow. Appears to have resolved  Fairy RONAL Sellar, MD [facility]

## 2023-11-18 ENCOUNTER — Inpatient Hospital Stay (HOSPITAL_COMMUNITY)

## 2023-11-18 DIAGNOSIS — R079 Chest pain, unspecified: Secondary | ICD-10-CM

## 2023-11-18 DIAGNOSIS — I71012 Dissection of descending thoracic aorta: Secondary | ICD-10-CM | POA: Diagnosis not present

## 2023-11-18 DIAGNOSIS — I10 Essential (primary) hypertension: Secondary | ICD-10-CM | POA: Diagnosis not present

## 2023-11-18 DIAGNOSIS — I480 Paroxysmal atrial fibrillation: Secondary | ICD-10-CM | POA: Diagnosis not present

## 2023-11-18 DIAGNOSIS — N1832 Chronic kidney disease, stage 3b: Secondary | ICD-10-CM | POA: Diagnosis not present

## 2023-11-18 LAB — RENAL FUNCTION PANEL
Albumin: 2.5 g/dL — ABNORMAL LOW (ref 3.5–5.0)
Anion gap: 14 (ref 5–15)
BUN: 35 mg/dL — ABNORMAL HIGH (ref 8–23)
CO2: 22 mmol/L (ref 22–32)
Calcium: 8.6 mg/dL — ABNORMAL LOW (ref 8.9–10.3)
Chloride: 106 mmol/L (ref 98–111)
Creatinine, Ser: 1.7 mg/dL — ABNORMAL HIGH (ref 0.61–1.24)
GFR, Estimated: 41 mL/min — ABNORMAL LOW (ref >60)
Glucose, Bld: 100 mg/dL — ABNORMAL HIGH (ref 70–99)
Phosphorus: 2 mg/dL — ABNORMAL LOW (ref 2.5–4.6)
Potassium: 3.3 mmol/L — ABNORMAL LOW (ref 3.5–5.1)
Sodium: 142 mmol/L (ref 135–145)

## 2023-11-18 LAB — TROPONIN I (HIGH SENSITIVITY): Troponin I (High Sensitivity): 643 ng/L (ref <18)

## 2023-11-18 LAB — HEPARIN LEVEL (UNFRACTIONATED): Heparin Unfractionated: <0.1 [IU]/mL — ABNORMAL LOW (ref 0.30–0.70)

## 2023-11-18 MED ORDER — MORPHINE SULFATE (PF) 2 MG/ML IV SOLN
2.0000 mg | Freq: Once | INTRAVENOUS | Status: AC
  Administered 2023-11-18: 2 mg via INTRAVENOUS

## 2023-11-18 MED ORDER — AMIODARONE HCL IN DEXTROSE 360-4.14 MG/200ML-% IV SOLN
60.0000 mg/h | INTRAVENOUS | Status: DC
  Administered 2023-11-18 (×2): 60 mg/h via INTRAVENOUS
  Filled 2023-11-18 (×2): qty 200

## 2023-11-18 MED ORDER — HEPARIN BOLUS VIA INFUSION
2000.0000 [IU] | Freq: Once | INTRAVENOUS | Status: AC
  Administered 2023-11-18: 2000 [IU] via INTRAVENOUS
  Filled 2023-11-18: qty 2000

## 2023-11-18 MED ORDER — HEPARIN (PORCINE) 25000 UT/250ML-% IV SOLN
1100.0000 [IU]/h | INTRAVENOUS | Status: DC
  Administered 2023-11-18: 700 [IU]/h via INTRAVENOUS
  Administered 2023-11-19 – 2023-11-20 (×2): 1100 [IU]/h via INTRAVENOUS
  Filled 2023-11-18 (×4): qty 250

## 2023-11-18 MED ORDER — LABETALOL HCL 200 MG PO TABS
200.0000 mg | ORAL_TABLET | Freq: Two times a day (BID) | ORAL | Status: DC
  Administered 2023-11-18 – 2023-11-19 (×3): 200 mg via ORAL
  Filled 2023-11-18 (×3): qty 1

## 2023-11-18 MED ORDER — AMLODIPINE BESYLATE 10 MG PO TABS
10.0000 mg | ORAL_TABLET | Freq: Every evening | ORAL | Status: DC
  Administered 2023-11-18 – 2023-11-19 (×2): 10 mg via ORAL
  Filled 2023-11-18 (×2): qty 1

## 2023-11-18 MED ORDER — ISOSORBIDE MONONITRATE ER 30 MG PO TB24
30.0000 mg | ORAL_TABLET | Freq: Every day | ORAL | Status: DC
  Administered 2023-11-18 – 2023-11-20 (×3): 30 mg via ORAL
  Filled 2023-11-18 (×3): qty 1

## 2023-11-18 MED ORDER — AMIODARONE LOAD VIA INFUSION
150.0000 mg | Freq: Once | INTRAVENOUS | Status: AC
  Administered 2023-11-18: 150 mg via INTRAVENOUS
  Filled 2023-11-18: qty 83.34

## 2023-11-18 MED ORDER — ACETAMINOPHEN 325 MG PO TABS
650.0000 mg | ORAL_TABLET | Freq: Four times a day (QID) | ORAL | Status: DC | PRN
  Administered 2023-11-18: 650 mg via ORAL
  Filled 2023-11-18: qty 2

## 2023-11-18 MED ORDER — SUCRALFATE 1 GM/10ML PO SUSP
1.0000 g | Freq: Three times a day (TID) | ORAL | Status: DC
  Administered 2023-11-18 – 2023-11-20 (×9): 1 g via ORAL
  Filled 2023-11-18 (×9): qty 10

## 2023-11-18 MED ORDER — HEPARIN BOLUS VIA INFUSION
1500.0000 [IU] | Freq: Once | INTRAVENOUS | Status: AC
  Administered 2023-11-18: 1500 [IU] via INTRAVENOUS
  Filled 2023-11-18: qty 1500

## 2023-11-18 MED ORDER — TRAMADOL HCL 50 MG PO TABS: 50.0000 mg | ORAL_TABLET | Freq: Two times a day (BID) | ORAL | Status: DC | PRN

## 2023-11-18 MED ORDER — POTASSIUM CHLORIDE 20 MEQ PO PACK
40.0000 meq | PACK | Freq: Once | ORAL | Status: AC
  Administered 2023-11-18: 40 meq
  Filled 2023-11-18: qty 2

## 2023-11-18 MED ORDER — NITROGLYCERIN 0.4 MG SL SUBL: 0.4000 mg | SUBLINGUAL_TABLET | SUBLINGUAL | Status: DC | PRN

## 2023-11-18 MED ORDER — MORPHINE SULFATE (PF) 2 MG/ML IV SOLN
INTRAVENOUS | Status: AC
  Filled 2023-11-18: qty 1

## 2023-11-18 MED ORDER — AMIODARONE HCL IN DEXTROSE 360-4.14 MG/200ML-% IV SOLN: 30.0000 mg/h | INTRAVENOUS | Status: DC

## 2023-11-18 MED ORDER — FLECAINIDE ACETATE 50 MG PO TABS
50.0000 mg | ORAL_TABLET | Freq: Two times a day (BID) | ORAL | Status: DC
  Administered 2023-11-18 – 2023-11-20 (×5): 50 mg via ORAL
  Filled 2023-11-18 (×6): qty 1

## 2023-11-18 MED ORDER — POTASSIUM CHLORIDE 20 MEQ PO PACK
40.0000 meq | PACK | Freq: Once | ORAL | Status: AC
  Administered 2023-11-18: 40 meq via ORAL
  Filled 2023-11-18: qty 2

## 2023-11-18 MED ORDER — POTASSIUM PHOSPHATES 15 MMOLE/5ML IV SOLN
30.0000 mmol | INTRAVENOUS | Status: AC
  Administered 2023-11-18: 30 mmol via INTRAVENOUS
  Filled 2023-11-18: qty 10

## 2023-11-18 MED ORDER — NITROGLYCERIN 0.4 MG SL SUBL
SUBLINGUAL_TABLET | SUBLINGUAL | Status: AC
  Administered 2023-11-18: 0.4 mg via SUBLINGUAL
  Filled 2023-11-18: qty 3

## 2023-11-18 NOTE — Progress Notes (Addendum)
 [facility], hospitalist service, overnight cross coverage.  Received a call from bedside RN regarding the patient having chest pain with abnormal twelve-lead EKG.  Personally reviewed twelve-lead EKG with ST depression in multiple leads concerning for possible ischemia.  Stat troponin ordered.  Dr. Donnel paged for cardiology consult.    Presented at bedside, the patient's chest pain has slightly subsided 7 out of 10 in severity.  Also endorses abdominal pain 9 out of 10.  BP 100/67 with heart rate in the 120s in A-fib with RVR.  IV morphine  administered.  Sublingual nitroglycerin  x 1.  Seen by cardiology, IV amiodarone and heparin  drip started per cardiology.  Time: 15 minutes.

## 2023-11-18 NOTE — Progress Notes (Signed)
 PHARMACY - ANTICOAGULATION CONSULT NOTE  Pharmacy Consult for Heparin  Indication: atrial fibrillation  Allergies  Allergen Reactions   Roxicodone  [Oxycodone ] Nausea And Vomiting   Pravachol  [Pravastatin ] Other (See Comments)    Myalgias    Sulfa Antibiotics Itching   Zohydro Er [Hydrocodone Bitartrate Er] Nausea Only   Eliquis  [Apixaban ] Itching and Rash   Hydrochlorothiazide Anxiety   Hytrin [Terazosin Hcl] Palpitations and Other (See Comments)    Heart races   Pradaxa  [Dabigatran  Etexilate Mesylate] Itching and Rash   Xarelto  [Rivaroxaban ] Itching and Rash    Patient Measurements: Height: 5' 10 (177.8 cm) Weight: 50.4 kg (111 lb 1.8 oz) IBW/kg (Calculated) : 73 HEPARIN  DW (KG): 66.7  Vital Signs: Temp: 98.6 F (37 C) (06/30 1152) Temp Source: Oral (06/30 1152) BP: 127/88 (06/30 1152) Pulse Rate: 92 (06/30 1152)  Labs: Recent Labs    11/16/23 0339 11/17/23 1028 11/18/23 0346 11/18/23 1140 11/18/23 1340  HGB 14.7  --   --   --   --   HCT 43.2  --   --   --   --   PLT 234  --   --   --   --   HEPARINUNFRC  --   --   --   --  <0.10*  CREATININE 2.13* 1.72* 1.70*  --   --   TROPONINIHS  --   --   --  643*  --     Estimated Creatinine Clearance: 25.9 mL/min (A) (by C-G formula based on SCr of 1.7 mg/dL (H)).   Medical History: Past Medical History:  Diagnosis Date   Allergy    Arthritis    psoriatic arthritis    Asthma    Atheroembolic kidney disease (HCC)    Atheroembolism    CAD (coronary artery disease)    hx non obst   Cancer (HCC)    basal cell x3 , last one removed - 12/12/2013, R temple area of forehead   Carotid artery occlusion    Chronic kidney disease    renal stones - tx. /w lithotripsy   Complication of anesthesia    GERD (gastroesophageal reflux disease)    HLD (hyperlipidemia)    HTN (hypertension)    Hypoglycemia    Paroxysmal atrial fibrillation (HCC)    PONV (postoperative nausea and vomiting)    Pruritus    Psoriasis     Shortness of breath    after eating , if he gets active, he might have some SOB   Vitamin D deficiency     Medications:  Scheduled:   amLODipine   10 mg Oral QPM   aspirin  EC  81 mg Oral Q0600   Chlorhexidine  Gluconate Cloth  6 each Topical Daily   feeding supplement  237 mL Oral BID BM   flecainide   50 mg Oral BID   isosorbide  mononitrate  30 mg Oral Daily   labetalol   200 mg Oral BID   morphine  (PF)       pantoprazole   40 mg Oral BID   sucralfate  1 g Oral TID WC & HS   tamsulosin   0.4 mg Oral Daily    Assessment: 78 y.o. male with chest pain and Afib for heparin   Initial HL <0.1. No issues with heparin  drip per RN. Will rebolus and increase drip.   Goal of Therapy:  Heparin  level 0.3-0.7 units/ml Monitor platelets by anticoagulation protocol: Yes   Plan:  Heparin  1500 units IV bolus, then increase heparin  to 850 units/hr Check heparin  level in  8 hours.   Roseland Braun A. Lyle, PharmD, BCPS, FNKF Clinical Pharmacist [facility] Please utilize Amion for appropriate phone number to reach the unit pharmacist [facility])

## 2023-11-18 NOTE — Plan of Care (Signed)

## 2023-11-18 NOTE — Progress Notes (Signed)
  Progress Note    11/18/2023 8:31 AM 4 Days Post-Op  Subjective:  Afebrile overnight Would like NG tube out  Vitals:   11/18/23 0707 11/18/23 0758  BP: (!) 143/73 127/82  Pulse: (!) 117 92  Resp: 12 18  Temp:  98.1 F (36.7 C)  SpO2: 96% 94%    Physical Exam: General:  no distress Cardiac:  regular Lungs:  non labored Incisions:  right groin access site without hematoma Vasc:  palpable DP pulses bilaterally   CBC    Component Value Date/Time   WBC 9.3 11/16/2023 0339   RBC 4.80 11/16/2023 0339   HGB 14.7 11/16/2023 0339   HCT 43.2 11/16/2023 0339   PLT 234 11/16/2023 0339   MCV 90.0 11/16/2023 0339   MCH 30.6 11/16/2023 0339   MCHC 34.0 11/16/2023 0339   RDW 13.5 11/16/2023 0339   LYMPHSABS 2.3 11/08/2023 2111   MONOABS 1.2 (H) 11/08/2023 2111   EOSABS 0.3 11/08/2023 2111   BASOSABS 0.1 11/08/2023 2111    BMET    Component Value Date/Time   NA 142 11/18/2023 0346   K 3.3 (L) 11/18/2023 0346   CL 106 11/18/2023 0346   CO2 22 11/18/2023 0346   GLUCOSE 100 (H) 11/18/2023 0346   BUN 35 (H) 11/18/2023 0346   CREATININE 1.70 (H) 11/18/2023 0346   CALCIUM  8.6 (L) 11/18/2023 0346   GFRNONAA 41 (L) 11/18/2023 0346   GFRAA 56 (L) 12/24/2013 1316    INR    Component Value Date/Time   INR 1.2 11/14/2023 1236     Intake/Output Summary (Last 24 hours) at 11/18/2023 0831 Last data filed at 11/18/2023 0703 Gross per 24 hour  Intake 30 ml  Output 4150 ml  Net -4120 ml      Assessment/Plan:  78 y.o. male is s/p:  TEVAR for contained rupture of descending thoracic intramural hematoma on 11/14/2023 by Dr. Magda. 4 Days Post-Op  Overall, I think Burda is improved dramatically.  Creatinine improved, nearly baseline Abdomen less distended.  Has had return of bowel function. Could consider moving NG tube from low to mid wall suction to gravity to assess output, at this point, if he is on clears, and it remains on low intermittent wall suction, output is  inconsequential.  Will continue to follow intermittently. Appreciate care from all teams involved.   Fonda FORBES Rim MD [facility] Phone Number: [phone] 11/18/2023 8:31 AM

## 2023-11-18 NOTE — Progress Notes (Signed)
 I was called by RN urgently-> Pt  reporting severe  chest pain  EKG obtained shows tall R wave in V1-V3, with ST depression concern for POSTERIOR ISCHEMIA. First 2 EKGs are normal leads, last EKG this am is a posterior lead-> Which DOES NOT show ST elevation in V4.   I saw the patient at bedside- he is in afib with RVR. He has severe abdominal pain-> has ileus and NG tube in place. ->>he took a dose of flecainide  and NTG on his own.  I d/w case with Dr Wonda as well. Pt had ST depressions on 6/26 as well but not as impressive on current and it was more diffuse.  Plan: -> start IV amiodarone bolus plus drip- -> start IV heparin  gtt -> continue to trend trops -> Bowel rest and mx

## 2023-11-18 NOTE — Progress Notes (Signed)
 Progress Note   Patient: Jack [name] FMW:[id number] DOB: May 25, 1945 DOA: 11/08/2023     9 DOS: the patient was seen and examined on 11/18/2023   Brief hospital course: Jack [name] was admitted to the hospital with acute aortic syndrome, aortic dissection type B with hypertensive emergency.    78 y.o. male with a PMH significant for CAD, CKD, HLD, HTN, PAF, and asthma who presented to the ED with complaints of sudden onset upper back pain. On ED arrival patient was seen hypertensive with BP 180/120, all other vital signs within normal.  Lab work significant for glucose 118, BUN 33, creatinine 190 with GFR 36, all other vital signs within normal limits.  Given concern for possible dissection MR angio chest was obtained and concerning for proximal descending thoracic aorta dissection.  Given AKI CT chest without contrast was also obtained and again concerning for intramural hematoma or contained perforation.  Patient was started on esmolol  and nicardipine  drips with PCCM and vascular consults.   6/21 Presented with upper back pain imaging concerning for aortic dissection admitted to ICU for esmolol  and nicardipine  drips 6/23 Esmolol  changed to labetalol , esmolol  changed to cleviprex , labetalol  PO dose increased 6/24 Nausea/ileus, NGT placed in PM with emesis. 6/25 PO antihypertensives transitioned to labetalol  gtt. 6/26 Early AM severe CP/back pain prompting repeat CT; concern for contained perf versus intraluminal hematoma. L radial A-line placed for BP monitoring. Taken to OR by [facility] for thoracic endovascular aortic repair.   Patient developed ileus/ partial small bowel obstruction.   06/29 transfer to [facility].   Assessment and Plan: * Aortic dissection (HCC) Sp thoracic endovascular aortic repair for descending thoracic intramural hematoma.   Continue antiplatelet therapy and blood pressure control He has developed a post operative ileus that is improving.  Post op care per vascular  surgery   Essential hypertension Hypertensive emergency on admission.  Blood pressure better controlled with oral agents.   Continue with amlodipine  to 10 mg and continue labetalol  200 mg bid per tube.  Resume isosorbide .  As needed IV hydralazine .    Chronic kidney disease, stage 3b (HCC) AKI hypokalemia. Hypophosphatemia   Renal function today with serum cr at 1,70 with K at 3,3 and serum bicarbonate at 22  Na 142 and Mg 2.5   Patient tolerating clears, off IV fluids.  Will add 40 meq Kcl x 2 doses and follow up renal function in am,  K phos 30 mmol Avoid hypotension and nephrotoxic medications.   PAF (paroxysmal atrial fibrillation) (HCC) Had RVR atrial fibrillation, rate this am 100 to 115 bpm He has been placed on IV amiodarone and IV heparin .  Will continue telemetry monitoring.  At home he was on Flecainide  will check with Cardiology if need to resume.    Ileus [facility]) Patient with positive bowel movement.  Will clamp tube today and follow up abdominal radiograph, if no nausea or vomiting, will plan to remove NG tube today. He has tolerating clears and having bowel function . Continue antiacid therapy with pantoprazole  and will add sucralfate. Continue as needed analgesics and antiemetics.   GERD Continue pantoprazole , add sucralfate.       Subjective: Patient with improved chest pain and abdominal pain, it was burning in nature, today with no dyspnea, and no vomiting, positive flatus today and bowel movement yesterday .   Physical Exam: Vitals:   11/18/23 0707 11/18/23 0758 11/18/23 0800 11/18/23 0851  BP: (!) 143/73 127/82 132/86 132/86  Pulse: (!) 117 92 91 91  Resp: 12 18 17    Temp:  98.1 F (36.7 C)    TempSrc:  Oral    SpO2: 96% 94% 94%   Weight:      Height:       Neurology awake and alert ENT with mild pallor, NG tube in place Cardiovascular with S1 and S2 present, irregularly irregular with no gallops, rubs or murmurs No JVD Respiratory with  no rales or wheezing, no rhonchi  Abdomen mild distention and tympanic to percussion, non tender No lower extremity edema   Data Reviewed:  =  Family Communication: I spoke with patient's wife at the bedside, we talked in detail about patient's condition, plan of care and prognosis and all questions were addressed.   Disposition: Status is: Inpatient Remains inpatient appropriate because: recovering aortic syndrome   Planned Discharge Destination: Home     Author: Elidia Toribio Furnace, MD 11/18/2023 9:07 AM  For on call review [email or web address].

## 2023-11-18 NOTE — Progress Notes (Signed)
 PHARMACY - ANTICOAGULATION CONSULT NOTE  Pharmacy Consult for Heparin  Indication: atrial fibrillation  Allergies  Allergen Reactions   Roxicodone  [Oxycodone ] Nausea And Vomiting   Pravachol  [Pravastatin ] Other (See Comments)    Myalgias    Sulfa Antibiotics Itching   Zohydro Er [Hydrocodone Bitartrate Er] Nausea Only   Eliquis  [Apixaban ] Itching and Rash   Hydrochlorothiazide Anxiety   Hytrin [Terazosin Hcl] Palpitations and Other (See Comments)    Heart races   Pradaxa  [Dabigatran  Etexilate Mesylate] Itching and Rash   Xarelto  [Rivaroxaban ] Itching and Rash    Patient Measurements: Height: 5' 10 (177.8 cm) Weight: 50.4 kg (111 lb 1.8 oz) IBW/kg (Calculated) : 73 HEPARIN  DW (KG): 66.7  Vital Signs: Temp: 98 F (36.7 C) (06/30 0314) Temp Source: Oral (06/30 0314) BP: 100/67 (06/30 0554) Pulse Rate: 110 (06/30 0314)  Labs: Recent Labs    11/16/23 0339 11/17/23 1028 11/18/23 0346  HGB 14.7  --   --   HCT 43.2  --   --   PLT 234  --   --   CREATININE 2.13* 1.72* 1.70*    Estimated Creatinine Clearance: 25.9 mL/min (A) (by C-G formula based on SCr of 1.7 mg/dL (H)).   Medical History: Past Medical History:  Diagnosis Date   Allergy    Arthritis    psoriatic arthritis    Asthma    Atheroembolic kidney disease (HCC)    Atheroembolism    CAD (coronary artery disease)    hx non obst   Cancer (HCC)    basal cell x3 , last one removed - 12/12/2013, R temple area of forehead   Carotid artery occlusion    Chronic kidney disease    renal stones - tx. /w lithotripsy   Complication of anesthesia    GERD (gastroesophageal reflux disease)    HLD (hyperlipidemia)    HTN (hypertension)    Hypoglycemia    Paroxysmal atrial fibrillation (HCC)    PONV (postoperative nausea and vomiting)    Pruritus    Psoriasis    Shortness of breath    after eating , if he gets active, he might have some SOB   Vitamin D deficiency     Medications:  Scheduled:   amiodarone   150 mg Intravenous Once   amLODipine   10 mg Per Tube QPM   aspirin  EC  81 mg Oral Q0600   Chlorhexidine  Gluconate Cloth  6 each Topical Daily   enoxaparin (LOVENOX) injection  30 mg Subcutaneous Daily   feeding supplement  237 mL Oral BID BM   labetalol   200 mg Per Tube BID   morphine  (PF)       pantoprazole   40 mg Oral BID   tamsulosin   0.4 mg Oral Daily    Assessment: 78 y.o. male with chest pain and Afib for heparin  Goal of Therapy:  Heparin  level 0.3-0.7 units/ml Monitor platelets by anticoagulation protocol: Yes   Plan:  D/C Lovenox Heparin  2000 units IV bolus, then start heparin  700 units/hr Check heparin  level in 8 hours.   Dail Cordella Misty 11/18/2023,6:04 AM

## 2023-11-18 NOTE — Consult Note (Addendum)
 Cardiology Consultation   Patient ID: Jack [name] MRN: [id number]; DOB: May 24, 1945  Admit date: 11/08/2023 Date of Consult: 11/18/2023  PCP:  Erick Greig LABOR, NP   [facility] Providers Cardiologist:  Lonni Cash, MD     Patient Profile: Jack [name] is a 78 y.o. male with a hx of CKD stage IIIb (single kidney), hyperlipidemia, hypertension, paroxysmal atrial fibrillation, asthma, CAD by Lexiscan  10/2022, carotid artery disease s/p right carotid endarterectomy 08/2023 who is being seen 11/18/2023 for the evaluation of atrial fibrillation with RVR, chest pain at the request of Dr. Noralee.  History of Present Illness: Jack [name] presented to [name], ED on 11/08/2023 complaining of sudden onset midthoracic back pain.  On arrival to the emergency department patient was seen with a blood pressure of 180/120, all other vital signs normal.  With concern for possible aortic dissection, MR angio chest was obtained, revealing concern for proximal descending thoracic aortic dissection.  Due to the patient's AKI on CKD CT chest without contrast was also obtained, again concerning for intramural hematoma or contained perforation.  Patient was started on esmolol  and nicardipine  drips with critical care and vascular consults placed.  Patient underwent several changes in his blood pressure regimen.  On 6/24 he experienced some nausea, concerning for an ileus.  NG tube was placed that evening.  On 6/25 his p.o. medications were transition back to IV labetalol  drip.  6/26 patient experienced severe chest pain, back pain and underwent a repeat CT scan.  Imaging showed concern for contained perforation versus intraluminal hematoma.  Left radial A-line was placed for blood pressure monitoring, he was then taken to the OR by vascular surgery for thoracic endovascular aortic repair for contained rupture of descending thoracic intramural hematoma.  On 6/29 he was transferred out of the ICU and to be  managed primarily by our hospitalist service.  Cardiology was consulted as the patient's family was requesting Dr. Cash as he had just recently seen the patient as an outpatient 09/09/2023 and they felt that he needed to be involved in her care.  It appears that earlier this morning, 6/30, cardiology was paged as the patient was reporting severe chest pain.  EKG was obtained and showed that he was in A-fib with RVR.  Patient was also experiencing severe abdominal pain.  Note states that patient took a dose of flecainide  and nitroglycerin  on his own.  Overnight fellow Dr. Wonda reviewed the case, noting the patient had ST depressions on 6/26 as well but they were not as impressive nor diffuse as currently.  Patient was started on IV amiodarone, IV heparin , troponins were ordered, pending results.  After speaking with the patient, he states that he had an episode of som severe chest pain that came on early this morning and lasted about an hour.  His wife had his home medications and gave him sublingual nitroglycerin  x 3 and a dose of his home flecainide .  Of note he has not had his home flecainide  at any point during his admission over the last 10 days.  The wife is a little bit frustrated saying that she has been asking for general cardiology to see the patient this entire time, the other specialties have told her that is unnecessary.  She states that it seems that there is significantly more concerned about his BP prior to his TEVAR however afterwards they were slow to restart his home medications, allowing his blood pressure to rise significantly.  She states that she frequently  asked why he was not on his home medications, and she said that she was told that he did not need them, which led to her bring them from home.  Patient states he has been doing slightly better, having his first BM this admission yesterday morning.  He denies any active chest pain, shortness of breath.  He states that he has been  having pretty consistent burning in his abdomen after eating, he does have a history of GERD.  He denies any known prior history of peptic ulcer disease, gastritis, any known food intolerances.   Past Medical History:  Diagnosis Date   Allergy    Arthritis    psoriatic arthritis    Asthma    Atheroembolic kidney disease (HCC)    Atheroembolism    CAD (coronary artery disease)    hx non obst   Cancer (HCC)    basal cell x3 , last one removed - 12/12/2013, R temple area of forehead   Carotid artery occlusion    Chronic kidney disease    renal stones - tx. /w lithotripsy   Complication of anesthesia    GERD (gastroesophageal reflux disease)    HLD (hyperlipidemia)    HTN (hypertension)    Hypoglycemia    Paroxysmal atrial fibrillation (HCC)    PONV (postoperative nausea and vomiting)    Pruritus    Psoriasis    Shortness of breath    after eating , if he gets active, he might have some SOB   Vitamin D deficiency    Past Surgical History:  Procedure Laterality Date   AV FISTULA PLACEMENT  07-2009   Left Brachiocephalic AVF   CARDIAC CATHETERIZATION  2010   COLONOSCOPY  09/15/2012   Colonic polyp status post polypectomy. Mild sigmoid diverticulosis. Small internal hemorrhoids. Otherwise normal colonoscopy    ENDARTERECTOMY Right 08/20/2023   Procedure: ENDARTERECTOMY, CAROTID;  Surgeon: Lanis Fonda BRAVO, MD;  Location: [facility] OR;  Service: Vascular;  Laterality: Right;   LIGATION OF ARTERIOVENOUS  FISTULA Left 12/30/2013   Procedure: EXCISION OF LEFT ARM ARTERIOVENOUS  FISTULA;  Surgeon: Krystal JULIANNA Doing, MD;  Location: [facility] OR;  Service: Vascular;  Laterality: Left;   LITHOTRIPSY     PATCH ANGIOPLASTY Right 08/20/2023   Procedure: PATCH ANGIOPLASTY USING GEORGE BIOLOGIC PATCH;  Surgeon: Lanis Fonda BRAVO, MD;  Location: [facility] OR;  Service: Vascular;  Laterality: Right;   skin cancer removed     multiple basal cell   THORACIC AORTIC ENDOVASCULAR STENT GRAFT N/A 11/14/2023   Procedure:  INSERTION, ENDOVASCULAR STENT GRAFT, AORTA, THORACIC;  Surgeon: Magda Debby SAILOR, MD;  Location: [facility] OR;  Service: Vascular;  Laterality: N/A;    Home Medications:  Prior to Admission medications   Medication Sig Start Date End Date Taking? Authorizing Provider  acetaminophen  (TYLENOL ) 500 MG tablet Take 500 mg by mouth every 6 (six) hours as needed for mild pain (pain score 1-3), moderate pain (pain score 4-6) or headache.   Yes [provider]  amLODipine  (NORVASC ) 5 MG tablet Take 1 tablet (5 mg total) by mouth daily for blood pressure Patient taking differently: Take 5 mg by mouth every evening. 08/23/23  Yes   aspirin  EC 81 MG tablet Take 81 mg by mouth every evening.   Yes [provider]  betamethasone  dipropionate 0.05 % lotion Apply 1 application  topically 2 (two) times daily as needed (Psoriasis).   Yes [provider]  Cholecalciferol (VITAMIN D-3 PO) Take 1 capsule by mouth daily.  Yes [provider]  Coenzyme Q10 (COQ10 PO) Take 1 capsule by mouth every evening.   Yes [provider]  Evolocumab  (REPATHA  SURECLICK) 140 MG/ML SOAJ Inject 140 mg into the skin every 14 (fourteen) days. Patient taking differently: Inject 140 mg into the skin See admin instructions. Inject 1mL (140mg ) into the skin every other Saturday. 06/21/23  Yes   flecainide  (TAMBOCOR ) 50 MG tablet Take 1 (one)  Tablet by mouth two times daily 08/23/23  Yes   isosorbide  mononitrate (IMDUR ) 30 MG 24 hr tablet Take 1 tablet (30 mg total) by mouth daily. 01/17/23  Yes Verlin Lonni BIRCH, MD  labetalol  (NORMODYNE ) 200 MG tablet Take 100 mg by mouth 2 (two) times daily.   Yes [provider]  loratadine (CLARITIN) 10 MG tablet Take 10 mg by mouth daily.   Yes [provider]  Omega-3 Fatty Acids (FISH OIL PO) Take 1 capsule by mouth daily.   Yes [provider]  Triamcinolone  Acetonide (KENALOG  IJ) Inject 1 Dose into the muscle every 3 (three) months.    Yes [provider]  triamcinolone  cream (KENALOG ) 0.1 % Apply topically 2 (two) times daily. Patient taking differently: Apply 1 Application topically 2 (two) times daily as needed (skin irritation, itching). 10/22/22  Yes   nitroGLYCERIN  (NITROSTAT ) 0.4 MG SL tablet Place 1 tablet (0.4 mg total) under the tongue every 5 (five) minutes as needed for chest pain. 01/17/23 08/16/23  Verlin Lonni BIRCH, MD   Scheduled Meds:  amLODipine   10 mg Oral QPM   aspirin  EC  81 mg Oral Q0600   Chlorhexidine  Gluconate Cloth  6 each Topical Daily   feeding supplement  237 mL Oral BID BM   isosorbide  mononitrate  30 mg Oral Daily   labetalol   200 mg Oral BID   morphine  (PF)       pantoprazole   40 mg Oral BID   potassium chloride   40 mEq Oral Once   sucralfate  1 g Oral TID WC & HS   tamsulosin   0.4 mg Oral Daily   Continuous Infusions:  amiodarone 60 mg/hr (11/18/23 0853)   Followed by   amiodarone     heparin  700 Units/hr (11/18/23 0629)   magnesium  sulfate bolus IVPB     potassium PHOSPHATE IVPB (in mmol) 30 mmol (11/18/23 0703)   promethazine  (PHENERGAN ) injection (IM or IVPB) 12.5 mg (11/18/23 0804)   PRN Meds: acetaminophen , acetaminophen , fentaNYL  (SUBLIMAZE ) injection, hydrALAZINE , magnesium  sulfate bolus IVPB, menthol-cetylpyridinium, morphine  (PF), nitroGLYCERIN , mouth rinse, phenol, polyethylene glycol, promethazine  (PHENERGAN ) injection (IM or IVPB), simethicone , traMADol , traZODone   Allergies:    Allergies  Allergen Reactions   Roxicodone  [Oxycodone ] Nausea And Vomiting   Pravachol  [Pravastatin ] Other (See Comments)    Myalgias    Sulfa Antibiotics Itching   Zohydro Er [Hydrocodone Bitartrate Er] Nausea Only   Eliquis  [Apixaban ] Itching and Rash   Hydrochlorothiazide Anxiety   Hytrin [Terazosin Hcl] Palpitations and Other (See Comments)    Heart races   Pradaxa  [Dabigatran  Etexilate Mesylate] Itching and Rash   Xarelto  [Rivaroxaban ] Itching and Rash   Social  History:   Social History   Socioeconomic History   Marital status: Married    Spouse name: Not on file   Number of children: 0   Years of education: Not on file   Highest education level: Not on file  Occupational History   Occupation: Retired from a Education officer, environmental  Tobacco Use   Smoking status: Former    Current packs/day: 0.00  Types: Cigarettes    Quit date: 05/21/1970    Years since quitting: 53.5   Smokeless tobacco: Never   Tobacco comments:    quit 40+ years ago   Vaping Use   Vaping status: Never Used  Substance and Sexual Activity   Alcohol use: No   Drug use: No   Sexual activity: Not on file  Other Topics Concern   Not on file  Social History Narrative   Married, lives with wife; Designer, television/film set.   Lives in [facility]   Social Drivers of Health   Financial Resource Strain: Not on file  Food Insecurity: No Food Insecurity (11/10/2023)   Hunger Vital Sign    Worried About Running Out of Food in the Last Year: Never true    Ran Out of Food in the Last Year: Never true  Transportation Needs: No Transportation Needs (11/10/2023)   PRAPARE - Administrator, Civil Service (Medical): No    Lack of Transportation (Non-Medical): No  Physical Activity: Not on file  Stress: Not on file  Social Connections: Unknown (11/11/2023)   Social Connection and Isolation Panel    Frequency of Communication with Friends and Family: Not on file    Frequency of Social Gatherings with Friends and Family: Not on file    Attends Religious Services: Not on file    Active Member of Clubs or Organizations: Not on file    Attends Club or Organization Meetings: 1 to 4 times per year    Marital Status: Married  Catering manager Violence: Not At Risk (11/10/2023)   Humiliation, Afraid, Rape, and Kick questionnaire    Fear of Current or Ex-Partner: No    Emotionally Abused: No    Physically Abused: No    Sexually Abused: No    Family History:   Family History   Problem Relation Age of Onset   Cancer Mother    Heart disease Father        Before age 67   Asthma Father    Heart disease Brother        After 5 years of age   Coronary artery disease Other        family hx   Colon cancer Neg Hx    Esophageal cancer Neg Hx    Liver cancer Neg Hx    Pancreatic cancer Neg Hx    Rectal cancer Neg Hx    Stomach cancer Neg Hx     ROS:  Please see the history of present illness.  All other ROS reviewed and negative.     Physical Exam/Data: Vitals:   11/18/23 0707 11/18/23 0758 11/18/23 0800 11/18/23 0851  BP: (!) 143/73 127/82 132/86 132/86  Pulse: (!) 117 92 91 91  Resp: 12 18 17    Temp:  98.1 F (36.7 C)    TempSrc:  Oral    SpO2: 96% 94% 94%   Weight:      Height:        Intake/Output Summary (Last 24 hours) at 11/18/2023 1147 Last data filed at 11/18/2023 0703 Gross per 24 hour  Intake --  Output 4150 ml  Net -4150 ml      11/16/2023    5:00 AM 11/15/2023    4:21 AM 11/14/2023    9:04 AM  Last 3 Weights  Weight (lbs) 111 lb 1.8 oz 134 lb 7.7 oz 147 lb  Weight (kg) 50.4 kg 61 kg 66.679 kg     Body mass index is  15.94 kg/m.   General:  Well nourished, well developed, in no acute distress, NG tube in place HEENT: normal Neck: no JVD Vascular: Distal pulses 2+ bilaterally Cardiac:  normal S1, S2; irregularly irregular rhythm Lungs:  clear to auscultation bilaterally, no wheezing, rhonchi or rales  Abd: soft, nontender, no hepatomegaly, active bowel sounds  Ext: no edema Musculoskeletal:  No deformities Skin: warm and dry  Neuro:  no focal abnormalities noted Psych:  Normal affect   Telemetry:  Telemetry was personally reviewed and demonstrates:  atrial fibrillation, with 90s  Relevant CV Studies: Limited echocardiogram 11/11/2023 Left ventricular ejection fraction, by estimation, is 60 to 65% . The left ventricle has normal function. The left ventricle has no regional wall motion abnormalities. Left ventricular diastolic  function could not be evaluated.  Right ventricular systolic function is normal. The right ventricular size is normal. The mitral valve is normal in structure.  The aortic valve is tricuspid. Aortic valve regurgitation is not visualized. No aortic stenosis is present.  The inferior vena cava is normal in size with < 50% respiratory variability, suggesting right atrial pressure of 8 mmHg.  Echocardiogram, 07/04/2023 Left ventricular ejection fraction, by estimation, is 55 to 60% . Left ventricular ejection fraction by PLAX is 56 % . The left ventricle has normal function. The left ventricle has no regional wall motion abnormalities. There is mild concentric left ventricular hypertrophy. Left ventricular diastolic parameters are consistent with Grade I diastolic dysfunction ( impaired relaxation)  Right ventricular systolic function is normal. The right ventricular size is normal. There is normal pulmonary artery systolic pressure.  The mitral valve is normal in structure. No evidence of mitral valve regurgitation. No evidence of mitral stenosis.  The aortic valve is tricuspid. Aortic valve regurgitation is not visualized. No aortic stenosis is present. Aortic dilatation noted. There is mild dilatation of the aortic root, measuring 40 mm. The inferior vena cava is normal in size with greater than 50% respiratory variability, suggesting right atrial pressure of 3 mmHg.  Long-term monitor, 06/26/2023 Patient had a min HR of 45 bpm, max HR of 119 bpm, and avg HR of 65 bpm. Predominant underlying rhythm was Sinus Rhythm.  First Degree AV Block was present.  Atrial Fibrillation/Flutter occurred (<1% burden), ranging from 60-119 bpm (avg of 90 bpm), the longest lasting 13 mins 28 secs with an avg rate of 90 bpm.  Atrial Fibrillation/Flutter was detected within +/- 45 seconds of symptomatic patient event(s).  Isolated SVEs were rare (<1.0%), and no SVE Couplets or SVE Triplets were present.  Isolated VEs  were rare (<1.0%), and no VE Couplets or VE Triplets were present. Ventricular Bigeminy and Trigeminy were present.  Lexiscan  10/29/2022   Resting ECG shows no ST-segment deviation. T wave inversion noted in 2 or more leads.   No ST deviation was noted from baseline during infusion. The ECG was not diagnostic due to pharmacologic protocol.   Diaphragmatic attenuation artifact was present.   Defect 1: There is a small defect with mild reduction in uptake present in the apical lateral location(s) that is reversible. Cannot rule out a very small area of ischemia but like represents diaphragmatic attenuation artificat. There is normal wall motion in the defect area.   Possible very mild ischemia in the apical lateral wall but likely related to diaphragmatic attenuation artifact. The study is low risk.   Left ventricular function is normal. Nuclear stress EF: 64 %. The left ventricular ejection fraction is normal (55-65%). End diastolic cavity size  is normal. End systolic cavity size is normal.   Prior study available for comparison from 02/27/2018 which showed normal perfusion.   Laboratory Data: High Sensitivity Troponin:   Recent Labs  Lab 11/12/23 0952 11/14/23 0750 11/14/23 1246 11/14/23 2336 11/15/23 0206  TROPONINIHS 29* 22* 24* 17 17     Chemistry Recent Labs  Lab 11/15/23 0206 11/15/23 1403 11/16/23 0339 11/17/23 1028 11/18/23 0346  NA 137 137 140 142 142  K 3.5 3.9 3.5 3.5 3.3*  CL 99 100 103 104 106  CO2 24 23 23 24 22   GLUCOSE 96 114* 106* 109* 100*  BUN 55* 53* 49* 39* 35*  CREATININE 2.57* 2.59* 2.13* 1.72* 1.70*  CALCIUM  8.6* 8.7* 8.8* 8.6* 8.6*  MG 2.4 2.5* 2.5*  --   --   GFRNONAA 25* 25* 31* 40* 41*  ANIONGAP 14 14 14 14 14     Recent Labs  Lab 11/18/23 0346  ALBUMIN 2.5*   Lipids No results for input(s): CHOL, TRIG, HDL, LABVLDL, LDLCALC, CHOLHDL in the last 168 hours.  Hematology Recent Labs  Lab 11/14/23 1236 11/15/23 0206 11/16/23 0339   WBC 6.7 6.1 9.3  RBC 4.54 4.68 4.80  HGB 14.1 14.6 14.7  HCT 40.2 41.6 43.2  MCV 88.5 88.9 90.0  MCH 31.1 31.2 30.6  MCHC 35.1 35.1 34.0  RDW 13.6 13.6 13.5  PLT 229 213 234   Thyroid   Recent Labs  Lab 11/11/23 1254  TSH 5.158*    BNP No results for input(s): BNP, PROBNP in the last 168 hours.   DDimer No results for input(s): DDIMER in the last 168 hours.  Radiology/Studies:  DG Abd 1 View Result Date: 11/18/2023 CLINICAL DATA:  Mid abdominal discomfort EXAM: ABDOMEN - 1 VIEW COMPARISON:  X-ray 11/16/2023 FINDINGS: Enteric tube in place with tip overlying the stomach. Stomach is mildly distended with fluid. There is gas and stool scattered along normal caliber colon. Rectal air and stool identified. There once again are some air-filled loops of small bowel in the mid abdomen which previously has a diameter approaching 4 cm. Today level of dilatation is slightly decreased. There could be some loops of 4 cm but other loops are decompressing slightly. Vascular calcifications in the pelvis. Overlapping cardiac leads. Aortic endograft along the descending thoracic aorta. No obvious free air on the supine radiographs. The lateral hemi abdominal edges are also clipped off the edge of the film. IMPRESSION: Limited x-rays. Persistent dilated loops of small bowel mid abdomen overall extent of distension is slightly improved from previous. Recommend continued follow-up. Please correlate with prior CT scan of 11/14/2023 Electronically Signed   By: Ranell Bring M.D.   On: 11/18/2023 10:31   DG Abd 1 View Result Date: 11/16/2023 CLINICAL DATA:  10323 Abdominal distension 10323 EXAM: ABDOMEN - 1 VIEW COMPARISON:  November 12, 2023, November 14, 2023 FINDINGS: Similar diffuse gaseous distension of the stomach and entire small bowel. Well-formed fecal material in the ascending colon. Minimal gas in the rectum. Esophagogastric tube terminates in the stomach. No pneumoperitoneum. No organomegaly or  radiopaque calculi. No acute fracture or destructive lesion. The lung bases are clear.Endovascular aortic stent graft in the descending aorta and upper abdominal aorta. IMPRESSION: Similar diffuse gaseous distension of the stomach and entire small bowel with minimal rectal gas present, possibly reflecting an adynamic ileus. Well-positioned esophagogastric tube terminating in the stomach. Electronically Signed   By: Rogelia Myers M.D.   On: 11/16/2023 13:08   DG Chest [facility] [address]  Result Date: 11/14/2023 CLINICAL DATA:  Graft placement EXAM: PORTABLE CHEST 1 VIEW COMPARISON:  Chest x-ray 11/11/2023 FINDINGS: New left-sided central venous catheter tip ends at the brachiocephalic SVC junction. Enteric tube extends below the diaphragm. There is a new endovascular graft in the descending thoracic aorta. The thoracic aorta is ectatic. The heart is enlarged. There is a small right pleural effusion. The lungs are otherwise clear. There is no pneumothorax or focal lung infiltrate. No acute osseous abnormality. IMPRESSION: 1. New left-sided central venous catheter with distal tip ending at the brachiocephalic SVC junction. 2. New endovascular graft in the descending thoracic aorta. 3. Small right pleural effusion. 4. Cardiomegaly. Electronically Signed   By: Greig Pique M.D.   On: 11/14/2023 16:56   Assessment and Plan: Paroxysmal atrial fibrillation, with episodes of RVR Hypertension Chest pain S/p TEVAR 6/26 for contained rupture of descending thoracic intramural hematoma  Patient reported chest pain early morning 6/30, stating it lasted about an hour, he took 3 of his own NTG (provided by his wife) and a dose of his home flecainide   Seen by overnight MD, noted to be in A-fib with RVR EKG showed A. Fib with RVR  Troponin levels were ordered but never drawn Currently in atrial fibrillation with HR 90-100s  Most recent BP 132/86, HR 91 Home meds: flecainide  50 mg BID, amlodipine  5 mg daily, Imdur  30 mg  daily, labetalol  100 mg BID, ASA 81 mg daily  Patient was started on IV amiodarone, bolus plus drip as well as IV heparin  overnight  Will stop amiodarone and re-start home flecainde  Keep on IV heparin  for now and discuss long term AC at discharge, patient has previously refused  Currently on amlodipine  10 mg daily, Imdur  30 mg daily, labetalol  200 mg BID, ASA 81 mg daily  Per primary CKD stage 3b Ileus, resolving  GERD  Risk Assessment/Risk Scores:     CHA2DS2-VASc Score = 4   This indicates a 4.8% annual risk of stroke. The patient's score is based upon: CHF History: 0 HTN History: 1 Diabetes History: 0 Stroke History: 0 Vascular Disease History: 1 Age Score: 2 Gender Score: 0        For questions or updates, please contact [facility] Please consult [email or web address] for contact info under    Signed, Waddell DELENA Donath, PA-C  11/18/2023 11:47 AM  I have personally seen and examined this patient. I agree with the assessment and plan as outlined above.  78 yo male well known to me with history of mild CAD, PAF, HTN, HLD and carotid artery disease with CKD who was admitted 11/08/23 with back pain. Found to have thoracic aortic dissection/intramural hematoma. He is now s/p TEVAR. Hospital course prolonged by ileus. Onset of rapid atrial fib last night.  Rate now controlled with IV amiodarone. Of note, home Flecainide  held since admission.  He had some chest pain while in rapid atrial fib but none since. Feels ok today. Only c/o abdominal pain.  Labs reviewed Tele reviewed by me. Atrial fib My exam: NAD Irreg irreg Lungs clear Ext: no LE edema.  Abd: + BS Plan: He is known to have paroxysmal atrial fib and now with recurrent atrial fib while off of Flecainide . Will stop amiodarone today and restart Flecainide . He has refused a DOAC in the past. Continue IV heparin  today. Discuss initiation of a DOAC tomorrow.  His chest pain was most likely related to his rapid AF.    Lonni Cash, MD, FACC 11/18/2023 12:32  PM

## 2023-11-18 NOTE — Consult Note (Signed)
 Jack [name] is an 78 y.o. male with HTN, HLD, CAD, P. Afib,  ASCVD s/p Right CEA, h/o atheroembolic kidney disease with AKI in 2011 following LHC, h/o CVA, nephrolithiasis, obstructive uropathy (marked right hydronephrosis due to congenital right UPJ obstruction), and CKD Stage IIIb-IV who presented to [place] ED via EMS on 11/08/23 with acute onset of upper back pain that occurred at rest.  BUN 33, Cr 1.9.  Imaging studies without contrast were concerning for proximal descending thoracic aorta dissection and intramural hematoma or contained perforation.  He was started on esmolol  and nicardipine  drips and admitted to the ICU.    Assessment/Plan:   AKI/CKD stage IIIb - baseline Scr around 1.9-2.  He has a solitary functioning kidney on the left due to chronic UPJ obstruction on the right.  Bump in Scr is likely hemodynamically mediated with rapid drop in Bp due to aortic dissection.  Also possible contribution from urinary retention.  UOP improved and renal function continues to improve with Cr now 1.7. BP also much better controlled.  Signing off at this time; please reconsult as needed. No expedited renal f/u necessary; has f/u with Dr. Tobie. Avoid nephrotoxic medications including NSAIDs and iodinated intravenous contrast exposure unless the latter is absolutely indicated.   Preferred narcotic agents for pain control are hydromorphone , fentanyl , and methadone. Morphine  should not be used.  Avoid Baclofen and avoid oral sodium phosphate  and magnesium  citrate based laxatives / bowel preps.  Continue strict Input and Output monitoring. Will monitor the patient closely with you and intervene or adjust therapy as indicated by changes in clinical status/labs  Acute aortic syndrome - concerning for aortic dissection type B.  Vascular surgery is following.  Holding off on CTA for now given AKI.  Blood pressures markedly improved with esmolol  and nicardipine . Hypertensive emergency  - improved with esmolol  and  nicardipine  -> now on Imdur , labetalol  200 bid, amlodipine  10mg .  Paroxysmal atrial fibrillation - long term flecanide, not on anticoagulation. Urinary retention - agree with foley catheter Hyponatremia - mild and normal Sosm.  Continue to follow.  Resting tremors - apparently new during this admission.  Do not think AKI is related to this.  Continue to follow.  Subjective: Abdominal discomfort; denies f/d/n/v/sob. Passing flatus but not much. BM yest.  Trend in Creatinine: Creatinine, Ser  Date/Time Value Ref Range Status  11/18/2023 03:46 AM 1.70 (H) 0.61 - 1.24 mg/dL Final  93/70/7974 89:71 AM 1.72 (H) 0.61 - 1.24 mg/dL Final  93/71/7974 96:60 AM 2.13 (H) 0.61 - 1.24 mg/dL Final  93/72/7974 97:96 PM 2.59 (H) 0.61 - 1.24 mg/dL Final  93/72/7974 97:93 AM 2.57 (H) 0.61 - 1.24 mg/dL Final  93/73/7974 87:63 PM 3.02 (H) 0.61 - 1.24 mg/dL Final  93/73/7974 96:98 AM 3.08 (H) 0.61 - 1.24 mg/dL Final  93/74/7974 96:51 AM 3.23 (H) 0.61 - 1.24 mg/dL Final  93/75/7974 96:88 AM 2.86 (H) 0.61 - 1.24 mg/dL Final  93/76/7974 97:61 AM 2.58 (H) 0.61 - 1.24 mg/dL Final  93/77/7974 97:73 AM 2.37 (H) 0.61 - 1.24 mg/dL Final  93/79/7974 90:88 PM 1.90 (H) 0.61 - 1.24 mg/dL Final  95/97/7974 95:69 AM 1.99 (H) 0.61 - 1.24 mg/dL Final  95/98/7974 92:79 AM 1.98 (H) 0.61 - 1.24 mg/dL Final  91/76/7975 87:54 PM 2.04 (H) 0.61 - 1.24 mg/dL Final  91/93/7984 98:83 PM 1.45 (H) 0.50 - 1.35 mg/dL Final  96/84/7988 92:92 AM 3.82 (H) 0.4 - 1.5 mg/dL Final  98/84/7988 95:60 AM 2.05 (H) 0.4 - 1.5 mg/dL  Final  06/03/2009 03:45 AM 2.18 (H) 0.4 - 1.5 mg/dL Final  98/86/7988 93:69 AM 1.99 (H) 0.4 - 1.5 mg/dL Final  98/87/7988 91:65 PM 2.0 (H) 0.4 - 1.5 mg/dL Final  87/69/7989 94:59 AM 1.37 0.4 - 1.5 mg/dL Final  87/71/7989 93:98 PM 1.17 0.4 - 1.5 mg/dL Final    PMH:   Past Medical History:  Diagnosis Date   Allergy    Arthritis    psoriatic arthritis    Asthma    Atheroembolic kidney disease (HCC)     Atheroembolism    CAD (coronary artery disease)    hx non obst   Cancer (HCC)    basal cell x3 , last one removed - 12/12/2013, R temple area of forehead   Carotid artery occlusion    Chronic kidney disease    renal stones - tx. /w lithotripsy   Complication of anesthesia    GERD (gastroesophageal reflux disease)    HLD (hyperlipidemia)    HTN (hypertension)    Hypoglycemia    Paroxysmal atrial fibrillation (HCC)    PONV (postoperative nausea and vomiting)    Pruritus    Psoriasis    Shortness of breath    after eating , if he gets active, he might have some SOB   Vitamin D deficiency     PSH:   Past Surgical History:  Procedure Laterality Date   AV FISTULA PLACEMENT  07-2009   Left Brachiocephalic AVF   CARDIAC CATHETERIZATION  2010   COLONOSCOPY  09/15/2012   Colonic polyp status post polypectomy. Mild sigmoid diverticulosis. Small internal hemorrhoids. Otherwise normal colonoscopy    ENDARTERECTOMY Right 08/20/2023   Procedure: ENDARTERECTOMY, CAROTID;  Surgeon: Lanis Fonda BRAVO, MD;  Location: [facility] OR;  Service: Vascular;  Laterality: Right;   LIGATION OF ARTERIOVENOUS  FISTULA Left 12/30/2013   Procedure: EXCISION OF LEFT ARM ARTERIOVENOUS  FISTULA;  Surgeon: Krystal JULIANNA Doing, MD;  Location: [facility] OR;  Service: Vascular;  Laterality: Left;   LITHOTRIPSY     PATCH ANGIOPLASTY Right 08/20/2023   Procedure: PATCH ANGIOPLASTY USING GEORGE BIOLOGIC PATCH;  Surgeon: Lanis Fonda BRAVO, MD;  Location: [facility] OR;  Service: Vascular;  Laterality: Right;   skin cancer removed     multiple basal cell   THORACIC AORTIC ENDOVASCULAR STENT GRAFT N/A 11/14/2023   Procedure: INSERTION, ENDOVASCULAR STENT GRAFT, AORTA, THORACIC;  Surgeon: Magda Debby SAILOR, MD;  Location: [facility] OR;  Service: Vascular;  Laterality: N/A;    Allergies:  Allergies  Allergen Reactions   Roxicodone  [Oxycodone ] Nausea And Vomiting   Pravachol  [Pravastatin ] Other (See Comments)    Myalgias    Sulfa Antibiotics Itching   Zohydro  Er [Hydrocodone Bitartrate Er] Nausea Only   Eliquis  [Apixaban ] Itching and Rash   Hydrochlorothiazide Anxiety   Hytrin [Terazosin Hcl] Palpitations and Other (See Comments)    Heart races   Pradaxa  [Dabigatran  Etexilate Mesylate] Itching and Rash   Xarelto  [Rivaroxaban ] Itching and Rash    Medications:   Prior to Admission medications   Medication Sig Start Date End Date Taking? Authorizing Provider  acetaminophen  (TYLENOL ) 500 MG tablet Take 500 mg by mouth every 6 (six) hours as needed for mild pain (pain score 1-3), moderate pain (pain score 4-6) or headache.   Yes [provider]  amLODipine  (NORVASC ) 5 MG tablet Take 1 tablet (5 mg total) by mouth daily for blood pressure Patient taking differently: Take 5 mg by mouth every evening. 08/23/23  Yes   aspirin  EC 81 MG tablet  Take 81 mg by mouth every evening.   Yes [provider]  betamethasone  dipropionate 0.05 % lotion Apply 1 application  topically 2 (two) times daily as needed (Psoriasis).   Yes [provider]  Cholecalciferol (VITAMIN D-3 PO) Take 1 capsule by mouth daily.   Yes [provider]  Coenzyme Q10 (COQ10 PO) Take 1 capsule by mouth every evening.   Yes [provider]  Evolocumab  (REPATHA  SURECLICK) 140 MG/ML SOAJ Inject 140 mg into the skin every 14 (fourteen) days. Patient taking differently: Inject 140 mg into the skin See admin instructions. Inject 1mL (140mg ) into the skin every other Saturday. 06/21/23  Yes   flecainide  (TAMBOCOR ) 50 MG tablet Take 1 (one)  Tablet by mouth two times daily 08/23/23  Yes   isosorbide  mononitrate (IMDUR ) 30 MG 24 hr tablet Take 1 tablet (30 mg total) by mouth daily. 01/17/23  Yes Verlin Lonni BIRCH, MD  labetalol  (NORMODYNE ) 200 MG tablet Take 100 mg by mouth 2 (two) times daily.   Yes [provider]  loratadine (CLARITIN) 10 MG tablet Take 10 mg by mouth daily.   Yes [provider]  Omega-3 Fatty Acids (FISH OIL PO)  Take 1 capsule by mouth daily.   Yes [provider]  Triamcinolone  Acetonide (KENALOG  IJ) Inject 1 Dose into the muscle every 3 (three) months.   Yes [provider]  triamcinolone  cream (KENALOG ) 0.1 % Apply topically 2 (two) times daily. Patient taking differently: Apply 1 Application topically 2 (two) times daily as needed (skin irritation, itching). 10/22/22  Yes   nitroGLYCERIN  (NITROSTAT ) 0.4 MG SL tablet Place 1 tablet (0.4 mg total) under the tongue every 5 (five) minutes as needed for chest pain. 01/17/23 08/16/23  Verlin Lonni BIRCH, MD    Inpatient medications:  amLODipine   10 mg Oral QPM   aspirin  EC  81 mg Oral Q0600   Chlorhexidine  Gluconate Cloth  6 each Topical Daily   feeding supplement  237 mL Oral BID BM   flecainide   50 mg Oral BID   isosorbide  mononitrate  30 mg Oral Daily   labetalol   200 mg Oral BID   morphine  (PF)       pantoprazole   40 mg Oral BID   potassium chloride   40 mEq Oral Once   sucralfate  1 g Oral TID WC & HS   tamsulosin   0.4 mg Oral Daily    Discontinued Meds:   Medications Discontinued During This Encounter  Medication Reason   amLODipine  (NORVASC ) 10 MG tablet Dose change   Cholecalciferol (VITAMIN D-3) 125 MCG (5000 UT) TABS Duplicate   Coenzyme Q10 (COQ-10) 100 MG capsule Duplicate   flecainide  (TAMBOCOR ) 100 MG tablet Dose change   labetalol  (NORMODYNE ) 100 MG tablet    Omega-3 Fatty Acids (FISH OIL) 1000 MG CAPS Duplicate   sodium chloride  (OCEAN) 0.65 % SOLN nasal spray    flecainide  (TAMBOCOR ) tablet 50 mg    amLODipine  (NORVASC ) tablet 5 mg    lactated ringers  infusion    labetalol  (NORMODYNE ) tablet 100 mg    metoCLOPramide  (REGLAN ) injection 10 mg    bisacodyl  (DULCOLAX) suppository 10 mg    flecainide  (TAMBOCOR ) tablet 50 mg    clonazePAM  (KLONOPIN ) tablet 1 mg    nicardipine  (CARDENE ) 20mg  in 0.86% saline IV infusion (0.1 mg/ml)    esmolol  (BREVIBLOC ) 2000 mg / 100 mL (20 mg/mL) infusion     isosorbide  mononitrate (IMDUR ) 24 hr tablet 30 mg    cloNIDine  (  CATAPRES ) tablet 0.2 mg    docusate sodium  (COLACE) capsule 100 mg    ondansetron  (ZOFRAN ) injection 4 mg    fentaNYL  (SUBLIMAZE ) injection 25 mcg    prochlorperazine  (COMPAZINE ) injection 10 mg    sodium zirconium cyclosilicate  (LOKELMA ) packet 10 g    clevidipine  (CLEVIPREX ) infusion 0.5 mg/mL    labetalol  (NORMODYNE ) infusion 5 mg/mL    traMADol  (ULTRAM ) tablet 50 mg    amLODipine  (NORVASC ) tablet 10 mg    tamsulosin  (FLOMAX ) capsule 0.4 mg    labetalol  (NORMODYNE ) tablet 200 mg    metoCLOPramide  (REGLAN ) injection 5 mg    isosorbide  mononitrate (IMDUR ) 24 hr tablet 60 mg    cloNIDine  (CATAPRES ) tablet 0.3 mg    furosemide  (LASIX ) injection 80 mg    pantoprazole  (PROTONIX ) EC tablet 40 mg    polyethylene glycol (MIRALAX  / GLYCOLAX ) packet 17 g    melatonin tablet 3 mg    bisacodyl  (DULCOLAX) EC tablet 10 mg    sodium bicarbonate  tablet 650 mg    calcium  carbonate (TUMS - dosed in mg elemental calcium ) chewable tablet 400 mg of elemental calcium     aspirin  EC tablet 81 mg    iodixanol  (VISIPAQUE ) 320 MG/ML injection Patient Discharge   heparin  6000 units / NS 500 mL irrigation Patient Discharge   0.9 % irrigation (POUR BTL) Patient Discharge   lactated ringers  infusion    fentaNYL  (SUBLIMAZE ) injection 25-50 mcg Patient Transfer   ondansetron  (ZOFRAN ) injection 4 mg Patient Transfer   ceFAZolin  (ANCEF ) IVPB 1 g/50 mL premix    ceFAZolin  (ANCEF ) IVPB 2g/100 mL premix    ceFAZolin  (ANCEF ) IVPB 2g/100 mL premix    hydrALAZINE  (APRESOLINE ) injection 20 mg    metoprolol  tartrate (LOPRESSOR ) injection 2.5-5 mg    acetaminophen  (TYLENOL ) tablet 650 mg    fentaNYL  (SUBLIMAZE ) injection 50-100 mcg    traMADol  (ULTRAM ) tablet 50 mg    labetalol  (NORMODYNE ) infusion 5 mg/mL    labetalol  (NORMODYNE ) injection 5 mg    amLODipine  (NORVASC ) tablet 5 mg    labetalol  (NORMODYNE ) injection 10 mg    potassium chloride  10 mEq in  100 mL IVPB    diazepam  (VALIUM ) injection 2.5 mg    pantoprazole  (PROTONIX ) injection 40 mg    labetalol  (NORMODYNE ) tablet 100 mg    amLODipine  (NORVASC ) tablet 5 mg    potassium chloride  SA (KLOR-CON  M) CR tablet 20-40 mEq    diazepam  (VALIUM ) tablet 2.5 mg    enoxaparin (LOVENOX) injection 30 mg    acetaminophen  (TYLENOL ) tablet 650 mg    traMADol  (ULTRAM ) tablet 50 mg    labetalol  (NORMODYNE ) tablet 200 mg    amLODipine  (NORVASC ) tablet 10 mg    0.9 %  sodium chloride  infusion    amiodarone (NEXTERONE PREMIX) 360-4.14 MG/200ML-% (1.8 mg/mL) IV infusion    amiodarone (NEXTERONE PREMIX) 360-4.14 MG/200ML-% (1.8 mg/mL) IV infusion     Social History:  reports that he quit smoking about 53 years ago. His smoking use included cigarettes. He has never used smokeless tobacco. He reports that he does not drink alcohol and does not use drugs.  Family History:   Family History  Problem Relation Age of Onset   Cancer Mother    Heart disease Father        Before age 21   Asthma Father    Heart disease Brother        After 23 years of age   Coronary artery disease Other  family hx   Colon cancer Neg Hx    Esophageal cancer Neg Hx    Liver cancer Neg Hx    Pancreatic cancer Neg Hx    Rectal cancer Neg Hx    Stomach cancer Neg Hx     Pertinent items are noted in HPI. Weight change:   Intake/Output Summary (Last 24 hours) at 11/18/2023 1152 Last data filed at 11/18/2023 0703 Gross per 24 hour  Intake --  Output 4150 ml  Net -4150 ml   BP 132/86   Pulse 91   Temp 98.1 F (36.7 C) (Oral)   Resp 17   Ht 5' 10 (1.778 m)   Wt 50.4 kg   SpO2 94%   BMI 15.94 kg/m  Vitals:   11/18/23 0707 11/18/23 0758 11/18/23 0800 11/18/23 0851  BP: (!) 143/73 127/82 132/86 132/86  Pulse: (!) 117 92 91 91  Resp: 12 18 17    Temp:  98.1 F (36.7 C)    TempSrc:  Oral    SpO2: 96% 94% 94%   Weight:      Height:         General appearance: alert, cooperative, and fatigued Head:  NCAT, NGT Eyes: negative findings: lids and lashes normal, conjunctivae and sclerae normal, and corneas clear Resp: clear to auscultation bilaterally Cardio: regular rate and rhythm, S1, S2 normal, no murmur, click, rub or gallop GI: NGT, distended, soft, non-tender; bowel sounds normal; no masses,  no  Extremities: extremities normal, atraumatic, no edema  Labs: Basic Metabolic Panel: Recent Labs  Lab 11/12/23 0311 11/13/23 0348 11/14/23 0301 11/14/23 1236 11/15/23 0206 11/15/23 1403 11/16/23 0339 11/17/23 1028 11/18/23 0346  NA 127* 129* 134* 137 137 137 140 142 142  K 5.4* 4.0 4.1 3.8 3.5 3.9 3.5 3.5 3.3*  CL 103 96* 98 97* 99 100 103 104 106  CO2 14* 23 19* 22 24 23 23 24 22   GLUCOSE 110* 115* 90 118* 96 114* 106* 109* 100*  BUN 43* 51* 58* 58* 55* 53* 49* 39* 35*  CREATININE 2.86* 3.23* 3.08* 3.02* 2.57* 2.59* 2.13* 1.72* 1.70*  ALBUMIN  --   --   --   --   --   --   --   --  2.5*  CALCIUM  8.5* 8.9 9.1 8.7* 8.6* 8.7* 8.8* 8.6* 8.6*  PHOS 4.8* 6.8* 5.8*  --  4.4  --  2.9  --  2.0*   Liver Function Tests: Recent Labs  Lab 11/18/23 0346  ALBUMIN 2.5*   No results for input(s): LIPASE, AMYLASE in the last 168 hours.  No results for input(s): AMMONIA in the last 168 hours.  CBC: Recent Labs  Lab 11/14/23 0518 11/14/23 1236 11/15/23 0206 11/16/23 0339  WBC 5.7 6.7 6.1 9.3  HGB 14.9 14.1 14.6 14.7  HCT 42.7 40.2 41.6 43.2  MCV 87.7 88.5 88.9 90.0  PLT 250 229 213 234   PT/INR: @LABRCNTIP (inr:5) Cardiac Enzymes: )No results for input(s): CKTOTAL, CKMB, CKMBINDEX, TROPONINI in the last 168 hours. CBG: Recent Labs  Lab 11/12/23 2312 11/14/23 0755 11/14/23 1417  GLUCAP 141* 108* 110*    Iron Studies: No results for input(s): IRON, TIBC, TRANSFERRIN, FERRITIN in the last 168 hours.  Xrays/Other Studies: DG Abd 1 View Result Date: 11/18/2023 CLINICAL DATA:  Mid abdominal discomfort EXAM: ABDOMEN - 1 VIEW COMPARISON:  X-ray  11/16/2023 FINDINGS: Enteric tube in place with tip overlying the stomach. Stomach is mildly distended with fluid. There is gas and stool scattered  along normal caliber colon. Rectal air and stool identified. There once again are some air-filled loops of small bowel in the mid abdomen which previously has a diameter approaching 4 cm. Today level of dilatation is slightly decreased. There could be some loops of 4 cm but other loops are decompressing slightly. Vascular calcifications in the pelvis. Overlapping cardiac leads. Aortic endograft along the descending thoracic aorta. No obvious free air on the supine radiographs. The lateral hemi abdominal edges are also clipped off the edge of the film. IMPRESSION: Limited x-rays. Persistent dilated loops of small bowel mid abdomen overall extent of distension is slightly improved from previous. Recommend continued follow-up. Please correlate with prior CT scan of 11/14/2023 Electronically Signed   By: Ranell Bring M.D.   On: 11/18/2023 10:31   DG Abd 1 View Result Date: 11/16/2023 CLINICAL DATA:  10323 Abdominal distension 10323 EXAM: ABDOMEN - 1 VIEW COMPARISON:  November 12, 2023, November 14, 2023 FINDINGS: Similar diffuse gaseous distension of the stomach and entire small bowel. Well-formed fecal material in the ascending colon. Minimal gas in the rectum. Esophagogastric tube terminates in the stomach. No pneumoperitoneum. No organomegaly or radiopaque calculi. No acute fracture or destructive lesion. The lung bases are clear.Endovascular aortic stent graft in the descending aorta and upper abdominal aorta. IMPRESSION: Similar diffuse gaseous distension of the stomach and entire small bowel with minimal rectal gas present, possibly reflecting an adynamic ileus. Well-positioned esophagogastric tube terminating in the stomach. Electronically Signed   By: Rogelia Myers M.D.   On: 11/16/2023 13:08

## 2023-11-18 NOTE — Progress Notes (Addendum)
 PT Cancellation Note  Patient Details Name: Jack [name] MRN: [id number] DOB: Apr 16, 1946   Cancelled Treatment:    Reason Eval/Treat Not Completed: Other (comment) (pt with elevated troponins (>600) and recent elevated HR, RN defer until following date.)  Will continue efforts per PT plan of care as schedule permits.  Connell HERO B[place] 11/18/2023, 5:24 PM* delayed entry

## 2023-11-18 NOTE — Progress Notes (Signed)
 Inpatient Rehab Admissions Coordinator:  Saw pt and wife at bedside. They remain hopeful that pt will be able to discharge home from acute care. Wife toured CIR. Will continue to follow.    Tinnie Yvone Cohens, MS, CCC-SLP Admissions Coordinator [phone]

## 2023-11-18 NOTE — Significant Event (Signed)
 Rapid Response Event Note   Reason for Call :  CP 8/10  Initial Focused Assessment:  Pt lying in bed on his L side. He is alert and oriented, c/o 8/10 mid sternal chest pain/pressure radiating to his L shoulder. He is also c/o stomach pain. Lungs clear/diminished. Skin warm/dry.   HR-120s, BP-90/73, RR-18, SpO2-96% on RA.   NGT placed on full continuous suction with 100cc bilious drainage out. After this, suctioned returned to [facility]. Pt reported improved stomach pain.  Interventions:  EKG-Afib with RVR, post infarct, possibly acute, marked ST abnormality, possible inf subendocardial injury. *no STE seen on this EKG* Posterior EKG done-Afib RVR, cannot rule out ANT infarct, age undetermine, ST and T wave abnormality, consider inf ischemia. *no STE seen on this EKG* Trop STAT then in 2 hours Cards consulted: 2mg  morphine  IV 0.4mg  ntg sl x 1>no change in CP Amiod 150mg  bolus and gtt started Heparin  bolus and gtt started [place] Plan of Care:  HR down to 107 after amiod bolus. Pt says his pain is getting better. Await trop results. Continue to monitor pt closely. Please call RRT if further assistance needed.   Event Summary:   MD Notified: Dr. Shona notified and came to beside. Dr. Deberah) consulted and came to bedside.  Call Upfz:[phone] Arrival Upfz:9553 End Upfz:[phone]  Tish Graeme Piety, RN

## 2023-11-19 ENCOUNTER — Telehealth (HOSPITAL_COMMUNITY): Payer: Self-pay | Admitting: Pharmacy Technician

## 2023-11-19 ENCOUNTER — Other Ambulatory Visit (HOSPITAL_COMMUNITY): Payer: Self-pay

## 2023-11-19 DIAGNOSIS — I4891 Unspecified atrial fibrillation: Secondary | ICD-10-CM

## 2023-11-19 DIAGNOSIS — E43 Unspecified severe protein-calorie malnutrition: Secondary | ICD-10-CM | POA: Insufficient documentation

## 2023-11-19 DIAGNOSIS — R079 Chest pain, unspecified: Secondary | ICD-10-CM | POA: Diagnosis not present

## 2023-11-19 DIAGNOSIS — I480 Paroxysmal atrial fibrillation: Secondary | ICD-10-CM | POA: Diagnosis not present

## 2023-11-19 DIAGNOSIS — I10 Essential (primary) hypertension: Secondary | ICD-10-CM | POA: Diagnosis not present

## 2023-11-19 DIAGNOSIS — I719 Aortic aneurysm of unspecified site, without rupture: Secondary | ICD-10-CM

## 2023-11-19 DIAGNOSIS — N1832 Chronic kidney disease, stage 3b: Secondary | ICD-10-CM | POA: Diagnosis not present

## 2023-11-19 DIAGNOSIS — I71012 Dissection of descending thoracic aorta: Secondary | ICD-10-CM | POA: Diagnosis not present

## 2023-11-19 LAB — BASIC METABOLIC PANEL WITH GFR
Anion gap: 10 (ref 5–15)
BUN: 35 mg/dL — ABNORMAL HIGH (ref 8–23)
CO2: 24 mmol/L (ref 22–32)
Calcium: 8.3 mg/dL — ABNORMAL LOW (ref 8.9–10.3)
Chloride: 107 mmol/L (ref 98–111)
Creatinine, Ser: 1.71 mg/dL — ABNORMAL HIGH (ref 0.61–1.24)
GFR, Estimated: 41 mL/min — ABNORMAL LOW (ref >60)
Glucose, Bld: 102 mg/dL — ABNORMAL HIGH (ref 70–99)
Potassium: 3.3 mmol/L — ABNORMAL LOW (ref 3.5–5.1)
Sodium: 141 mmol/L (ref 135–145)

## 2023-11-19 LAB — RENAL FUNCTION PANEL
Albumin: 2.4 g/dL — ABNORMAL LOW (ref 3.5–5.0)
Anion gap: 12 (ref 5–15)
BUN: 35 mg/dL — ABNORMAL HIGH (ref 8–23)
CO2: 23 mmol/L (ref 22–32)
Calcium: 8.3 mg/dL — ABNORMAL LOW (ref 8.9–10.3)
Chloride: 106 mmol/L (ref 98–111)
Creatinine, Ser: 1.73 mg/dL — ABNORMAL HIGH (ref 0.61–1.24)
GFR, Estimated: 40 mL/min — ABNORMAL LOW (ref >60)
Glucose, Bld: 102 mg/dL — ABNORMAL HIGH (ref 70–99)
Phosphorus: 2.5 mg/dL (ref 2.5–4.6)
Potassium: 3.3 mmol/L — ABNORMAL LOW (ref 3.5–5.1)
Sodium: 141 mmol/L (ref 135–145)

## 2023-11-19 LAB — CBC
HCT: 40.3 % (ref 39.0–52.0)
Hemoglobin: 13.7 g/dL (ref 13.0–17.0)
MCH: 30.2 pg (ref 26.0–34.0)
MCHC: 34 g/dL (ref 30.0–36.0)
MCV: 89 fL (ref 80.0–100.0)
Platelets: 272 10*3/uL (ref 150–400)
RBC: 4.53 MIL/uL (ref 4.22–5.81)
RDW: 13.8 % (ref 11.5–15.5)
WBC: 11.5 10*3/uL — ABNORMAL HIGH (ref 4.0–10.5)
nRBC: 0 % (ref 0.0–0.2)

## 2023-11-19 LAB — HEPARIN LEVEL (UNFRACTIONATED)
Heparin Unfractionated: 0.21 [IU]/mL — ABNORMAL LOW (ref 0.30–0.70)
Heparin Unfractionated: 0.37 [IU]/mL (ref 0.30–0.70)
Heparin Unfractionated: <0.1 [IU]/mL — ABNORMAL LOW (ref 0.30–0.70)

## 2023-11-19 MED ORDER — POTASSIUM CHLORIDE 20 MEQ PO PACK
40.0000 meq | PACK | ORAL | Status: AC
  Administered 2023-11-19 (×2): 40 meq via ORAL
  Filled 2023-11-19 (×2): qty 2

## 2023-11-19 MED ORDER — METOPROLOL TARTRATE 50 MG PO TABS
50.0000 mg | ORAL_TABLET | Freq: Two times a day (BID) | ORAL | Status: DC
  Administered 2023-11-19 – 2023-11-20 (×3): 50 mg via ORAL
  Filled 2023-11-19 (×3): qty 1

## 2023-11-19 NOTE — Progress Notes (Signed)
 PHARMACY - ANTICOAGULATION  Pharmacy Consult for Heparin  Indication: atrial fibrillation Brief A/P: Heparin  level subtherapeutic Increase Heparin  rate  Allergies  Allergen Reactions   Roxicodone  [Oxycodone ] Nausea And Vomiting   Pravachol  [Pravastatin ] Other (See Comments)    Myalgias    Sulfa Antibiotics Itching   Zohydro Er [Hydrocodone Bitartrate Er] Nausea Only   Eliquis  [Apixaban ] Itching and Rash   Hydrochlorothiazide Anxiety   Hytrin [Terazosin Hcl] Palpitations and Other (See Comments)    Heart races   Pradaxa  [Dabigatran  Etexilate Mesylate] Itching and Rash   Xarelto  [Rivaroxaban ] Itching and Rash    Patient Measurements: Height: 5' 10 (177.8 cm) Weight: 50.4 kg (111 lb 1.8 oz) IBW/kg (Calculated) : 73 HEPARIN  DW (KG): 66.7  Vital Signs: Temp: 98.4 F (36.9 C) (06/30 2339) Temp Source: Oral (06/30 2339) BP: 116/80 (06/30 2339) Pulse Rate: 104 (06/30 2339)  Labs: Recent Labs    11/16/23 0339 11/17/23 1028 11/18/23 0346 11/18/23 1140 11/18/23 1340 11/19/23 0024  HGB 14.7  --   --   --   --   --   HCT 43.2  --   --   --   --   --   PLT 234  --   --   --   --   --   HEPARINUNFRC  --   --   --   --  <0.10* <0.10*  CREATININE 2.13* 1.72* 1.70*  --   --   --   TROPONINIHS  --   --   --  643*  --   --     Estimated Creatinine Clearance: 25.9 mL/min (A) (by C-G formula based on SCr of 1.7 mg/dL (H)).   Assessment: 78 y.o. male with chest pain and Afib for heparin  Goal of Therapy:  Heparin  level 0.3-0.7 units/ml Monitor platelets by anticoagulation protocol: Yes   Plan:  Increase Heparin  1100 units/hr Check heparin  level in 8 hours.   Dail Cordella Misty 11/19/2023,12:50 AM

## 2023-11-19 NOTE — Plan of Care (Signed)
  Problem: Education: Goal: Knowledge of General Education information will improve Description: Including pain rating scale, medication(s)/side effects and non-pharmacologic comfort measures Outcome: Progressing   Problem: Clinical Measurements: Goal: Ability to maintain clinical measurements within normal limits will improve Outcome: Progressing Goal: Will remain free from infection Outcome: Progressing Goal: Diagnostic test results will improve Outcome: Progressing Goal: Respiratory complications will improve Outcome: Progressing Goal: Cardiovascular complication will be avoided Outcome: Progressing   Problem: Activity: Goal: Risk for activity intolerance will decrease Outcome: Progressing   Problem: Nutrition: Goal: Adequate nutrition will be maintained Outcome: Progressing   Problem: Coping: Goal: Level of anxiety will decrease Outcome: Progressing   Problem: Elimination: Goal: Will not experience complications related to bowel motility Outcome: Progressing Goal: Will not experience complications related to urinary retention Outcome: Progressing   Problem: Pain Managment: Goal: General experience of comfort will improve and/or be controlled Outcome: Progressing   Problem: Safety: Goal: Ability to remain free from injury will improve Outcome: Progressing   Problem: Skin Integrity: Goal: Risk for impaired skin integrity will decrease Outcome: Progressing   Problem: Clinical Measurements: Goal: Postoperative complications will be avoided or minimized Outcome: Progressing   Problem: Respiratory: Goal: Will achieve and/or maintain a regular respiratory rate, without signs or symptoms of dyspnea Outcome: Progressing   Problem: Skin Integrity: Goal: Demonstration of wound healing without infection will improve Outcome: Progressing

## 2023-11-19 NOTE — Progress Notes (Signed)
 PHARMACY - ANTICOAGULATION CONSULT NOTE  Pharmacy Consult for Heparin  Indication: atrial fibrillation  Allergies  Allergen Reactions   Roxicodone  [Oxycodone ] Nausea And Vomiting   Pravachol  [Pravastatin ] Other (See Comments)    Myalgias    Sulfa Antibiotics Itching   Zohydro Er [Hydrocodone Bitartrate Er] Nausea Only   Eliquis  [Apixaban ] Itching and Rash   Hydrochlorothiazide Anxiety   Hytrin [Terazosin Hcl] Palpitations and Other (See Comments)    Heart races   Pradaxa  [Dabigatran  Etexilate Mesylate] Itching and Rash   Xarelto  [Rivaroxaban ] Itching and Rash    Patient Measurements: Height: 5' 10 (177.8 cm) Weight: 67 kg (147 lb 9.6 oz) IBW/kg (Calculated) : 73 HEPARIN  DW (KG): 66.7  Vital Signs: Temp: 98.3 F (36.8 C) (07/01 1149) Temp Source: Oral (07/01 1149) BP: 138/81 (07/01 0807) Pulse Rate: 108 (07/01 0345)  Labs: Recent Labs    11/18/23 0346 11/18/23 1140 11/18/23 1340 11/19/23 0024 11/19/23 0318 11/19/23 0319 11/19/23 1009  HGB  --   --   --   --  13.7  --   --   HCT  --   --   --   --  40.3  --   --   PLT  --   --   --   --  272  --   --   HEPARINUNFRC  --   --    < > <0.10* 0.21*  --  0.37  CREATININE 1.70*  --   --   --  1.71* 1.73*  --   TROPONINIHS  --  643*  --   --   --   --   --    < > = values in this interval not displayed.    Estimated Creatinine Clearance: 33.9 mL/min (A) (by C-G formula based on SCr of 1.73 mg/dL (H)).   Medical History: Past Medical History:  Diagnosis Date   Allergy    Arthritis    psoriatic arthritis    Asthma    Atheroembolic kidney disease (HCC)    Atheroembolism    CAD (coronary artery disease)    hx non obst   Cancer (HCC)    basal cell x3 , last one removed - 12/12/2013, R temple area of forehead   Carotid artery occlusion    Chronic kidney disease    renal stones - tx. /w lithotripsy   Complication of anesthesia    GERD (gastroesophageal reflux disease)    HLD (hyperlipidemia)    HTN  (hypertension)    Hypoglycemia    Paroxysmal atrial fibrillation (HCC)    PONV (postoperative nausea and vomiting)    Pruritus    Psoriasis    Shortness of breath    after eating , if he gets active, he might have some SOB   Vitamin D deficiency     Medications:  Scheduled:   amLODipine   10 mg Oral QPM   aspirin  EC  81 mg Oral Q0600   Chlorhexidine  Gluconate Cloth  6 each Topical Daily   feeding supplement  237 mL Oral BID BM   flecainide   50 mg Oral BID   isosorbide  mononitrate  30 mg Oral Daily   metoprolol  tartrate  50 mg Oral BID   pantoprazole   40 mg Oral BID   sucralfate  1 g Oral TID WC & HS   tamsulosin   0.4 mg Oral Daily    Assessment: 78 y.o. male with AF RVR on heparin  drip. No AC PTA per pt preference, possibly ok with rechallenging with  DOAC.  Heparin  level therapeutic, CBC ok.  Goal of Therapy:  Heparin  level 0.3-0.7 units/ml Monitor platelets by anticoagulation protocol: Yes   Plan:  Continue heparin  1100 units/h Daily heparin  level and CBC  Ozell Jamaica, PharmD, BCPS, [facility] Clinical Pharmacist [phone] Please check AMION for all [facility] Pharmacy numbers 11/19/2023

## 2023-11-19 NOTE — Telephone Encounter (Addendum)
 Patient Product/process development scientist completed.    The patient is insured through [employer]/ [employer]. Patient has Medicare and is not eligible for a copay card, but may be able to apply for patient assistance or Medicare RX Payment Plan (Patient Must reach out to their plan, if eligible for payment plan), if available.    Ran test claim for Savaysa 30 mg and Product Not on Formulary  Ran test claim for Eliquis  5 mg and 30 day copay is $47.00  This test claim was processed through [facility]- copay amounts may vary at other pharmacies due to [employer], or as the patient moves through the different stages of their insurance plan.     Reyes Sharps, CPHT Pharmacy Technician III Certified Patient Advocate [facility] Pharmacy Patient Advocate Team Direct Number: [phone]  Fax: [phone]

## 2023-11-19 NOTE — Progress Notes (Signed)
 Inpatient Rehabilitation Admissions Coordinator   I met at bedside with patient and his wife. He is hopeful for continued improvement to discharge home when medically ready.  Heron Leavell, RN, MSN Rehab Admissions Coordinator [phone] 11/19/2023 12:05 PM

## 2023-11-19 NOTE — Progress Notes (Signed)
 Initial Nutrition Assessment  DOCUMENTATION CODES:  Severe malnutrition in context of acute illness/injury  INTERVENTION:  Continue clear liquid diet as ordered NGT clamping and diet advancement as tolerated per MD Ensure Plus High Protein po BID, each supplement provides 350 kcal and 20 grams of protein per MD order  If unable to tolerate NGT clamping and further diet advancement, would recommend initiation of TPN within 24-48 hours to address nutrition needs   NUTRITION DIAGNOSIS:  Severe Malnutrition related to acute illness (aortic syndrome, aortic dissection) as evidenced by energy intake < or equal to 50% for > or equal to 5 days, severe muscle depletion, moderate muscle depletion.  GOAL:  Patient will meet greater than or equal to 90% of their needs  MONITOR:  Supplement acceptance, Diet advancement, Labs, Weight trends, I & O's  REASON FOR ASSESSMENT:  NPO/Clear Liquid Diet    ASSESSMENT:  Pt admitted with c/o sudden onset upper back pain d/t acute aortic syndrome, aortic dissection type B with hypertensive emergency. PMH significant for CAD, CKD, HLD, HTN, PAF and asthma.  6/21: admitted 6/24: increased nausea, ileus, NGT to suction 6/25: NPO 6/26: repeat CT with concern for contained perf versus intraluminal hematoma; OR for thoracic endovascular aortic repair 6/27: clear liquid diet 6/30 abdominal xray- distended small bowel loops; improved from prior imaging 7/1: NGT clamping trial  Noted plans for NGT clamping trial today. If tolerates, NGT to be removed. Pt has been NPO /clear liquid since 6/25. Given extended length of inadequate nutritional intake and high nutrition risk, would recommend initiation of TPN within 24-48 hours if unable to advance diet further.   Spoke with pt at bedside. He is in good spirits though mentions that because he is in afib, he is feeling quite tired. Wife also present at time of visit.   He states that up until admission, he was  eating well and at his baseline. He typically consumes 2-3 meals per day with snacks in between. They enjoy picking something up for lunch on occasion which may include chicken tenders or teriyaki chicken.   He denies any prior difficulty chewing/swallowing. Reports that his throat has been sore with the NGT in place.   NGT clamped this morning to test tolerance.  Minimal output in canister from this morning.  Last BM was 2 days ago. He endorses having gas today. Abdomen is soft.   Pt reports that his weight PTA was 147 lbs and states that he was weighed today and measured 147 lbs which surprised him given his inadequate oral intake throughout admission.   Breakfast tray on bedside table which pt had consumed 100% of jello, popsicle and 2 juices. He is also consuming Ensure supplements.   NGT to suction: x24 hours First 12 hours was and second 12 hour time frame output had decreased to  Admission weight documentation has been widely variable, it is difficult to assess any true weight trends.    Medications: reviewed  Labs:  Potassium 3.3 BUN 35 Cr 1.73 GFR 40  NUTRITION - FOCUSED PHYSICAL EXAM: Given the severity of muscle deficits, it is likely pt has a more chronic period of undernutrition however based on dietary recall and pt's report os stable weight PTA pt currently meets criteria for acute malnutrition.  Flowsheet Row Most Recent Value  Orbital Region No depletion  Upper Arm Region Severe depletion  Thoracic and Lumbar Region Mild depletion  Buccal Region No depletion  Temple Region No depletion  Clavicle Bone Region  Moderate depletion  Clavicle and Acromion Bone Region Mild depletion  Scapular Bone Region Mild depletion  Dorsal Hand Moderate depletion  Patellar Region Severe depletion  Anterior Thigh Region Severe depletion  Posterior Calf Region Severe depletion  Edema (RD Assessment) None  Hair Reviewed  Eyes Reviewed  Mouth Reviewed  Skin  Reviewed  Nails Reviewed   Diet Order:   Diet Order             Diet clear liquid Room service appropriate? Yes; Fluid consistency: Thin  Diet effective now                   EDUCATION NEEDS:   Education needs have been addressed  Skin:  Skin Assessment: Reviewed RN Assessment  Last BM:  7/1 type 1 small  Height:   Ht Readings from Last 1 Encounters:  11/14/23 5' 10 (1.778 m)    Weight:   Wt Readings from Last 1 Encounters:  11/19/23 67 kg   BMI:  Body mass index is 21.18 kg/m.  Estimated Nutritional Needs:   Kcal:  1900-2100  Protein:  95-110g  Fluid:  >/=1.9L  Royce Maris, RDN, LDN Clinical Nutrition See AMiON for contact information.

## 2023-11-19 NOTE — Progress Notes (Addendum)
 Progress Note  Patient Name: Jack [name] Date of Encounter: 11/19/2023 [facility] Cardiologist: Lonni Cash, MD   Interval Summary    Sitting up in bed, NG tube in clamped for now. No nausea, abd pain currently. Passing gas this morning. Still in atrial fibrillation with elevated rates.   Vital Signs Vitals:   11/19/23 0146 11/19/23 0318 11/19/23 0345 11/19/23 0807  BP:  130/69    Pulse:  (!) 119 (!) 108   Resp: 15 20 19    Temp:  98 F (36.7 C)  (!) 97.4 F (36.3 C)  TempSrc:  Oral  Oral  SpO2:  99%    Weight:  67 kg    Height:        Intake/Output Summary (Last 24 hours) at 11/19/2023 1007 Last data filed at 11/19/2023 0854 Gross per 24 hour  Intake 1500.61 ml  Output 2575 ml  Net -1074.39 ml      11/19/2023    3:18 AM 11/16/2023    5:00 AM 11/15/2023    4:21 AM  Last 3 Weights  Weight (lbs) 147 lb 9.6 oz 111 lb 1.8 oz 134 lb 7.7 oz  Weight (kg) 66.951 kg 50.4 kg 61 kg      Telemetry/ECG  Atrial fibrillation rates 100-120s - Personally Reviewed  Physical Exam  GEN: No acute distress.  NG tube in place  Neck: No JVD Cardiac: Irreg Irreg, no murmurs, rubs, or gallops.  Respiratory: Clear to auscultation bilaterally. GI: Soft, nontender, non-distended  MS: No edema  Assessment & Plan   78 y.o. male with a hx of CKD stage IIIb (single kidney), hyperlipidemia, hypertension, paroxysmal atrial fibrillation, asthma, CAD by Lexiscan  10/2022, carotid artery disease s/p right carotid endarterectomy 08/2023 who was seen 11/18/2023 for the evaluation of atrial fibrillation with RVR, chest pain at the request of Dr. Noralee.   Hx of Paroxysmal atrial fibrillation now with RVR Chest pain -- reported episode of chest pain morning of 6/30 and noted to be in atrial fibrillation with RVR with ST depression -- hsTn 643, though in the setting of RVR, no further chest pain but continues to feel palpitations  -- initially placed on IV amiodarone, stopped and  converted back to home dose flecainide  50mg  BID yesterday with continuation of labetalol  200mg  BID. Remains in afib with rates 100-120s at times.  -- remains on IV heparin , he reports full body rash with Eliquis , Xarelto  and Praxada previously. Discussed coumadin as an option but he remains hesitant regarding OAC at this time.   Aneurysm of descending thoracic aorta s/p thoracic endovascular aortic repair for descending thoracic intramural hematoma 6/25 -- per [facility]  Per Primary Post op ileus w/ NG tube CKD stage IIIb Carotid artery disease s/p endarterectomy   For questions or updates, please contact [facility] Please consult [email or web address] for contact info under       Signed, Manuelita Rummer, NP    I have personally seen and examined this patient. I agree with the assessment and plan as outlined above.  Pt without complaints this am. Heart rates are still elevated today after stopping IV amiodarone yesterday. I would recommend that we continue his Flecainide  and change his Labetalol  over to metoprolol .  -Continue IV heparin  for now. He tells me today that he would consider trying Eliquis  again as it was not clear that a rash was actually related to any of the DOACs in the past.  -Stop Labetalol  -Start metoprolol  50 mg po BID  We will  follow with you.   Lonni Cash, MD, [facility] 11/19/2023 10:42 AM

## 2023-11-19 NOTE — Progress Notes (Addendum)
 Progress Note   Patient: Jack [name] FMW:[id number] DOB: June 13, 1945 DOA: 11/08/2023     10 DOS: the patient was seen and examined on 11/19/2023   Brief hospital course: Mr. Jack [name] was admitted to the hospital with acute aortic syndrome, aortic dissection type B with hypertensive emergency.    78 y.o. male with a PMH significant for CAD, CKD, HLD, HTN, PAF, and asthma who presented to the ED with complaints of sudden onset upper back pain. Severe pain that occurred while he was seating watching TV., EMS was called and patient was transported to the ED.  On ED arrival patient was seen hypertensive with BP 180/120, HR 59, RR 11 and 02 saturation 98% Lungs with no wheezing or rales, heart with S1 and S2 present and regular with no gallops, rubs or murmurs, abdomen with no distention, non tender and soft to palpation, no lower extremity edema.   Na 140, K 3,8 Cl 103 bicarbonate 25, glucose 118 bun 33 cr 1,90  BNP 523  High sensitive troponin 6 and 6  Wbc 8,5 hgb 16,2 plt 251   Chest radiograph with hypoinflation, no cardiomegaly, left base atelectasis.   EKG 83 bpm, normal axis, normal intervals, qtc 448, sinus rhythm with no significant ST segment or T wave changes.   Chest abdomen MRI with contained extramural collection of T1 bright material within the proximal descending thoracic aorta measuring up to 4.2 cm in greatest dimension. Possible contained perforation.  Distal descending thoracic aorta measuring up to 4,8 cm in greatest dimension.  Marked right hydronephrosis, likely related to a congential right UPJ obstruction, with no significant residual right renal parenchyma identified.   Ct chest non contrast, with focal area of attenuation peripheral to atherosclerotic aorta along the posterior and lateral aspect of the proximal descending thoracic aorta. Possible focal intramural hematoma or contained perforation.  Aneurysmal dilatation of the descending thoracic aorta measuring 4.4 cm at  the level of the hiatus.  Severe chronic right hydronephrosis with diffuse right renal parenchymal atrophy.  5 mm peri- fissural nodule alone the minor fissure.   Patient was started on esmolol  and nicardipine  drips. 6/21 Presented with upper back pain imaging concerning for aortic dissection admitted to ICU for esmolol  and nicardipine  drips 6/23 Esmolol  changed to labetalol , esmolol  changed to cleviprex , labetalol  PO dose increased 6/24 Nausea/ileus, NGT placed in PM with emesis. 6/25 PO antihypertensives transitioned to labetalol  gtt. 6/26 Early AM severe CP/back pain prompting repeat CT; concern for contained perf versus intraluminal hematoma. L radial A-line placed for BP monitoring. Taken to OR by VVS for thoracic endovascular aortic repair.   Patient developed ileus/ partial small bowel obstruction.   06/29 transfer to [facility].  06/30 chest and abdominal pain with positive EKG changes, noted atrial fibrillation with RVR. Placed on amiodarone drip.  07/01 clinically improving ileus, continue atrial fibrillation with RVR.  Assessment and Plan: * Dissecting aneurysm of descending thoracic aorta (HCC) Sp thoracic endovascular aortic repair for descending thoracic intramural hematoma.   Continue antiplatelet therapy and blood pressure control He has developed a post operative ileus that is improving.  Post op care per vascular surgery  Essential hypertension Hypertensive emergency on admission.  Blood pressure better controlled with oral agents.   Systolic blood pressure 130 mmHg range.   Amlodipine  to 10 mg, labetalol  200 mg bid and Isosorbide  30 mg daily.   As needed IV hydralazine .   Chronic kidney disease, stage 3b (HCC) AKI hypokalemia. Hypophosphatemia  Chronic right hydronephrosis.  Renal function stale with serum cr at 1,73 with K at 3,3 and serum bicarbonate at 23  Na 141 P 2,5   Continue K correction with Kcl, 40 meq x 2  Continue blood pressure control, avoid  nephrotoxic medications.  Follow up renal function and electrolytes in am.   PAF (paroxysmal atrial fibrillation) (HCC) This morning continue in atrial fibrillation with RVR with rate 120 bpm range.   Amiodarone has stopped and resumed on Flecainide .  Continue IV heparin  for anticoagulation, possible transition to DOAC.    Ileus (HCC) 06/30 abdominal radiograph with distended small bowel loops but not air fluid levels. Improved from previous imaging.  Clinical examination today with improvement in abdominal distention   Plan to clamp tube today and if good toleration to remove.  Continue antiacid therapy with pantoprazole  and sucralfate As needed antiemetics and analgesics Patient has been tolerating clears well.   GERD Continue pantoprazole  and sucralfate.      Subjective: Patient is feeling better, abdomen is more soft and having flatus, no nausea or vomiting, positive palpitations but not chest pain.    Physical Exam: Vitals:   11/19/23 0146 11/19/23 0318 11/19/23 0345 11/19/23 0807  BP:  130/69    Pulse:  (!) 119 (!) 108   Resp: 15 20 19    Temp:  98 F (36.7 C)  (!) 97.4 F (36.3 C)  TempSrc:  Oral  Oral  SpO2:  99%    Weight:  67 kg    Height:       Neurology awake and alert ENT with mild pallor, NG tube in place Cardiovascular with S1 and S2 present, irregularly irregular with no gallops, rubs or murmurs No JVD Respiratory with no rales or wheezing, on anterior auscultation, no rhonchi  Abdomen is soft and non distended, non tender No lower extremity edema   Data Reviewed:   Family Communication: no family at the bedside   Disposition: Status is: Inpatient Remains inpatient appropriate because: recovering ileus,   Planned Discharge Destination: Home    Author: Elidia Jack Furnace, MD 11/19/2023 9:34 AM  For on call review [email or web address].

## 2023-11-19 NOTE — Progress Notes (Signed)
 PT Cancellation Note  Patient Details Name: Jack [name] MRN: [id number] DOB: 1945/10/01   Cancelled Treatment:    Reason Eval/Treat Not Completed: (P) Fatigue/lethargy limiting ability to participate (pt recently returned to supine after getting up to bathroom with nursing staff assist.) Pt reports very significant fatigue. Will continue efforts per PT plan of care as schedule permits.   [name] M [name] 11/19/2023, 5:07 PM

## 2023-11-19 NOTE — Progress Notes (Signed)
 Occupational Therapy Treatment Patient Details Name: Jack [name] MRN: [id number] DOB: 09/16/1945 Today's Date: 11/19/2023   History of present illness 18M who was admitted for proximal descending thoracic aortic dissection. 6/24 NGT placed.  6/26 TEVAR PMH: CAD, HTN, CKD, asthma, and PAF   OT comments  Pt with elevated HR 172 but not sustain and only when exiting the bathroom to power up. Pt sustained HR 140s with toileting position. Pt able to void bowels this session. Pt fatigued quickly and family interested in helping progress to inpatient rehab recovery. Recommendation Patient will benefit from intensive inpatient follow-up therapy, >3 hours/day       If plan is discharge home, recommend the following:  Two people to help with walking and/or transfers;Two people to help with bathing/dressing/bathroom   Equipment Recommendations  BSC/3in1;Other (comment) (rw)    Recommendations for Other Services Rehab consult    Precautions / Restrictions Precautions Precautions: Fall Recall of Precautions/Restrictions: Impaired Precaution/Restrictions Comments: BP NG tube watch HR       Mobility Bed Mobility        11/19/23 1000  Bed Mobility  Overal bed mobility Modified Independent                Transfers      11/19/23 1000  Transfers  Overall transfer level Needs assistance  Equipment used Rolling walker (2 wheels)  Transfers Sit to/from Stand  Sit to Stand Min assist                       Balance                                           ADL either performed or assessed with clinical judgement   ADL Overall ADL's : Needs assistance/impaired   Eating/Feeding Details (indicate cue type and reason): ~                     Toilet Transfer: Contact guard Designer, fashion/clothing- Clothing Manipulation and Hygiene: Minimal assistance              Extremity/Trunk Assessment Upper Extremity  Assessment Upper Extremity Assessment: Generalized weakness RUE Deficits / Details: tremors appear resolved LUE Deficits / Details: tremors appears resolved            Diplomatic Services operational officer Communication Communication: Impaired Factors Affecting Communication: Hearing impaired   Cognition Arousal: Alert Behavior During Therapy: Flat affect Cognition: Cognition impaired     Awareness: Intellectual awareness intact, Online awareness impaired                                  Cueing      Exercises      Shoulder Instructions       General Comments max HR 162 not sustained and sustained around 146 with bathroom sitting and rebounded with end of toileting on RA    Pertinent Vitals/ Pain          Home Living  Prior Functioning/Environment              Frequency  Min 2X/week        Progress Toward Goals  OT Goals(current goals can now be found in the care plan section)  Progress towards OT goals: Progressing toward goals  Acute Rehab OT Goals Patient Stated Goal: to get NG tube out OT Goal Formulation: With patient/family Time For Goal Achievement: 11/26/23 Potential to Achieve Goals: Good ADL Goals Pt Will Perform Eating: with set-up;with adaptive utensils;sitting Pt Will Perform Grooming: with set-up;sitting Pt Will Transfer to Toilet: with min assist;stand pivot transfer;bedside commode Additional ADL Goal #1: pt will complete bed mobility mod I as precursor to adls. Additional ADL Goal #2: pt will complete cognitive task with less than 2 errors  Plan      Co-evaluation                 AM-PAC OT 6 Clicks Daily Activity     Outcome Measure                    End of Session Equipment Utilized During Treatment: Rolling walker (2 wheels)  OT Visit Diagnosis: Unsteadiness on feet (R26.81);Muscle weakness (generalized)  (M62.81)   Activity Tolerance Patient tolerated treatment well   Patient Left in bed;with call bell/phone within reach;with bed alarm set   Nurse Communication Mobility status;Precautions        Time: 1029- 1051    Charges: OT General Charges $OT Visit: 1 Visit   Ely, OTR/L  Acute Rehabilitation Services Office: [phone] .   Ely Molt 11/19/2023, 10:46 AM

## 2023-11-19 NOTE — Progress Notes (Signed)
 Physical Therapy Treatment Patient Details Name: Jack [name] MRN: [id number] DOB: Dec 02, 1945 Today's Date: 11/19/2023   History of Present Illness 57M who was admitted for proximal descending thoracic aortic dissection. 6/24 NGT placed.  6/26 TEVAR PMH: CAD, HTN, CKD, asthma, and PAF    PT Comments  Pt received in supine, agreeable to therapy session with encouragement. Emphasis on transfer safety, techniques to reduce reliance of BLE on bed frame and elevated surfaces to achieve upright, needs multimodal cues at times to perform transfers via unfamiliar technique. Pt performs transfers with CGA with BUE on RW from very elevated surface height, and up to modA with lower bed surface height. Pt unable to stand with maxA from lowest bed height pushing from mattress/knees but gave up quickly. HR max 120 bpm with standing BLE exercises, needing seated break after 5-10 reps of each exercise with good decrease in HR to 90's bpm while seated. Patient will benefit from intensive inpatient follow-up therapy, >3 hours/day.    If plan is discharge home, recommend the following: A lot of help with walking and/or transfers;A lot of help with bathing/dressing/bathroom;Assist for transportation;Help with stairs or ramp for entrance   Can travel by private vehicle        Equipment Recommendations  Other (comment) (TBD)    Recommendations for Other Services       Precautions / Restrictions Precautions Precautions: Fall Recall of Precautions/Restrictions: Impaired Precaution/Restrictions Comments: watch HR Restrictions Weight Bearing Restrictions Per Provider Order: No     Mobility  Bed Mobility Overal bed mobility: Modified Independent Bed Mobility: Supine to Sit                Transfers Overall transfer level: Needs assistance Equipment used: Rolling walker (2 wheels) Transfers: Sit to/from Stand Sit to Stand: Min assist, Mod assist           General transfer comment: elevated  bed>RW with CGA, from lower bed height with up to modA, decreased carryover of cues for safe UE placement. Pt using BLE braced against bed frame for stability with stand>sit and needs reminder to reach back for bed for each transfer as he tends to maintain BUE on RW handles when not cued and plopping with poor eccentric control.    Ambulation/Gait Ambulation/Gait assistance: Min assist   Assistive device: Rolling walker (2 wheels)       Pre-gait activities: standing hip flexion x10 reps ea     Stairs             Wheelchair Mobility     Tilt Bed    Modified Rankin (Stroke Patients Only)       Balance Overall balance assessment: Needs assistance Sitting-balance support: Feet supported, No upper extremity supported Sitting balance-Leahy Scale: Fair Sitting balance - Comments: EOB   Standing balance support: Reliant on assistive device for balance, During functional activity Standing balance-Leahy Scale: Poor Standing balance comment: reliant on external support and RW                            Communication Communication Communication: Impaired Factors Affecting Communication: Hearing impaired  Cognition Arousal: Alert Behavior During Therapy: Flat affect   PT - Cognitive impairments: Sequencing, Problem solving, Safety/Judgement                       PT - Cognition Comments: Pt with some decreased carryover of safety cues for UE placement during session. Following commands:  Impaired Following commands impaired: Only follows one step commands consistently    Cueing Cueing Techniques: Verbal cues, Gestural cues  Exercises General Exercises - Lower Extremity Hip Flexion/Marching: AROM, Both, 10 reps, Standing Heel Raises: AROM, Both, 10 reps, Standing Mini-Sqauts: AROM, 5 reps, Standing Other Exercises Other Exercises: STS x 5 reps reciprocal from elevated bed<>RW    General Comments General comments (skin integrity, edema, etc.): Max  HR 120 bpm with standing exercises      Pertinent Vitals/Pain Pain Assessment Pain Assessment: Faces Faces Pain Scale: Hurts a little bit Pain Location: belching alot once moving Pain Intervention(s): Limited activity within patient's tolerance, Monitored during session, Repositioned    Home Living                          Prior Function            PT Goals (current goals can now be found in the care plan section) Acute Rehab PT Goals Patient Stated Goal: To feel better PT Goal Formulation: With patient/family Time For Goal Achievement: 11/25/23 Progress towards PT goals: Progressing toward goals    Frequency    Min 3X/week      PT Plan      Co-evaluation              AM-PAC PT 6 Clicks Mobility   Outcome Measure  Help needed turning from your back to your side while in a flat bed without using bedrails?: A Little Help needed moving from lying on your back to sitting on the side of a flat bed without using bedrails?: A Lot (pt heavily reliant on bed rails to sit up) Help needed moving to and from a bed to a chair (including a wheelchair)?: A Little Help needed standing up from a chair using your arms (e.g., wheelchair or bedside chair)?: A Lot Help needed to walk in hospital room?: A Lot Help needed climbing 3-5 steps with a railing? : Total 6 Click Score: 13    End of Session Equipment Utilized During Treatment: Gait belt Activity Tolerance: Patient tolerated treatment well;Patient limited by fatigue Patient left: in bed;with call bell/phone within reach;with bed alarm set;with family/visitor present Nurse Communication: Mobility status PT Visit Diagnosis: Unsteadiness on feet (R26.81);Muscle weakness (generalized) (M62.81);Difficulty in walking, not elsewhere classified (R26.2);Other abnormalities of gait and mobility (R26.89)     Time: 8271-8254 PT Time Calculation (min) (ACUTE ONLY): 17 min  Charges:    $Therapeutic Exercise: 8-22  mins PT General Charges $$ ACUTE PT VISIT: 1 Visit                     Jamisha Hoeschen P., PTA Acute Rehabilitation Services Secure Chat Preferred 9a-5:30pm Office: [phone]    Connell HERO [facility] 11/19/2023, 6:21 PM

## 2023-11-20 ENCOUNTER — Other Ambulatory Visit (HOSPITAL_COMMUNITY): Payer: Self-pay

## 2023-11-20 ENCOUNTER — Telehealth (HOSPITAL_COMMUNITY): Payer: Self-pay | Admitting: Pharmacy Technician

## 2023-11-20 DIAGNOSIS — I10 Essential (primary) hypertension: Secondary | ICD-10-CM | POA: Diagnosis not present

## 2023-11-20 LAB — CBC
HCT: 38.7 % — ABNORMAL LOW (ref 39.0–52.0)
Hemoglobin: 13 g/dL (ref 13.0–17.0)
MCH: 30 pg (ref 26.0–34.0)
MCHC: 33.6 g/dL (ref 30.0–36.0)
MCV: 89.4 fL (ref 80.0–100.0)
Platelets: 263 10*3/uL (ref 150–400)
RBC: 4.33 MIL/uL (ref 4.22–5.81)
RDW: 13.6 % (ref 11.5–15.5)
WBC: 11.3 10*3/uL — ABNORMAL HIGH (ref 4.0–10.5)
nRBC: 0 % (ref 0.0–0.2)

## 2023-11-20 LAB — RENAL FUNCTION PANEL
Albumin: 2.3 g/dL — ABNORMAL LOW (ref 3.5–5.0)
Anion gap: 9 (ref 5–15)
BUN: 30 mg/dL — ABNORMAL HIGH (ref 8–23)
CO2: 24 mmol/L (ref 22–32)
Calcium: 8 mg/dL — ABNORMAL LOW (ref 8.9–10.3)
Chloride: 105 mmol/L (ref 98–111)
Creatinine, Ser: 1.51 mg/dL — ABNORMAL HIGH (ref 0.61–1.24)
GFR, Estimated: 47 mL/min — ABNORMAL LOW (ref >60)
Glucose, Bld: 110 mg/dL — ABNORMAL HIGH (ref 70–99)
Phosphorus: 1.5 mg/dL — ABNORMAL LOW (ref 2.5–4.6)
Potassium: 4 mmol/L (ref 3.5–5.1)
Sodium: 138 mmol/L (ref 135–145)

## 2023-11-20 LAB — HEPARIN LEVEL (UNFRACTIONATED): Heparin Unfractionated: 0.55 [IU]/mL (ref 0.30–0.70)

## 2023-11-20 MED ORDER — PANTOPRAZOLE SODIUM 40 MG PO TBEC
40.0000 mg | DELAYED_RELEASE_TABLET | Freq: Two times a day (BID) | ORAL | 0 refills | Status: AC
  Filled 2023-11-20: qty 60, 30d supply, fill #0

## 2023-11-20 MED ORDER — RIVAROXABAN 15 MG PO TABS
15.0000 mg | ORAL_TABLET | Freq: Every day | ORAL | Status: DC
  Administered 2023-11-20: 15 mg via ORAL
  Filled 2023-11-20: qty 1

## 2023-11-20 MED ORDER — TAMSULOSIN HCL 0.4 MG PO CAPS
0.4000 mg | ORAL_CAPSULE | Freq: Every day | ORAL | 2 refills | Status: AC
  Filled 2023-11-20 – 2023-12-23 (×2): qty 30, 30d supply, fill #0
  Filled 2024-02-04: qty 30, 30d supply, fill #1

## 2023-11-20 MED ORDER — RIVAROXABAN 15 MG PO TABS
15.0000 mg | ORAL_TABLET | Freq: Every day | ORAL | 2 refills | Status: DC
  Filled 2023-11-20 – 2023-12-13 (×2): qty 30, 30d supply, fill #0

## 2023-11-20 MED ORDER — AMLODIPINE BESYLATE 10 MG PO TABS
10.0000 mg | ORAL_TABLET | Freq: Every evening | ORAL | 2 refills | Status: DC
  Filled 2023-11-20 – 2023-12-13 (×2): qty 30, 30d supply, fill #0
  Filled 2024-01-09: qty 30, 30d supply, fill #1

## 2023-11-20 MED ORDER — METOPROLOL TARTRATE 50 MG PO TABS
50.0000 mg | ORAL_TABLET | Freq: Two times a day (BID) | ORAL | 2 refills | Status: AC
  Filled 2023-11-20 – 2023-12-13 (×2): qty 60, 30d supply, fill #0
  Filled 2024-01-09: qty 60, 30d supply, fill #1

## 2023-11-20 NOTE — Telephone Encounter (Signed)
 Patient Product/process development scientist completed.    The patient is insured through [employer]/ [employer]. Patient has Medicare and is not eligible for a copay card, but may be able to apply for patient assistance or Medicare RX Payment Plan (Patient Must reach out to their plan, if eligible for payment plan), if available.    Ran test claim for Xarelto  20 mg and the current 30 day co-pay is $47.00.   This test claim was processed through [facility]- copay amounts may vary at other pharmacies due to pharmacy/plan contracts, or as the patient moves through the different stages of their insurance plan.     Reyes Sharps, CPHT Pharmacy Technician III Certified Patient Advocate [facility] Patient Advocate Team Direct Number: [phone]  Fax: [phone]

## 2023-11-20 NOTE — Progress Notes (Signed)
 Nutrition Brief Note  Briefly checked in with patient at bedside.  NGT has been removed and patient has continued to tolerate clear liquids without nausea or emesis. He does endorse some bloating but also mentions that he had a bowel movement today.   Patient is hopeful to return home soon.  Discussed diet with MD given pt has been on limited/restricted diet >/=7 days. MD is amenable to resuming a more solid diet. RD placed order for GI soft/low fiber diet given recent ileus to aid in tolerance.   Will continue to monitor nutritional adequacy and necessity of additional dietary interventions throughout admission. However, please reach out in the meantime if additional nutrition related concerns should arise.   Allie Jayston Trevino, RDN, LDN Clinical Nutrition See AMiON for contact information.

## 2023-11-20 NOTE — Progress Notes (Signed)
  Progress Note  Patient Name: Jack [name] Date of Encounter: 11/20/2023 [facility] Cardiologist: Lonni Cash, MD   Interval Summary    No complaints today  Vital Signs Vitals:   11/19/23 2302 11/20/23 0300 11/20/23 0500 11/20/23 0747  BP: 116/73 123/73  119/87  Pulse: 85 95  94  Resp: 16 18  20   Temp: 98.5 F (36.9 C) 97.8 F (36.6 C)  97.7 F (36.5 C)  TempSrc: Oral Oral  Oral  SpO2: 96% 95%  96%  Weight:   67.8 kg   Height:        Intake/Output Summary (Last 24 hours) at 11/20/2023 0811 Last data filed at 11/20/2023 0400 Gross per 24 hour  Intake 1059.77 ml  Output 1175 ml  Net -115.23 ml      11/20/2023    5:00 AM 11/19/2023    3:18 AM 11/16/2023    5:00 AM  Last 3 Weights  Weight (lbs) 149 lb 8 oz 147 lb 9.6 oz 111 lb 1.8 oz  Weight (kg) 67.813 kg 66.951 kg 50.4 kg      Telemetry/ECG  Atrial fib, rate 80-90s  Personally Reviewed  Physical Exam   General: Well developed, well nourished, NAD  HEENT: OP clear, mucus membranes moist  SKIN: warm, dry. No rashes. Neuro: No focal deficits  Musculoskeletal: Muscle strength 5/5 all ext  Psychiatric: Mood and affect normal  Neck: No JVD, no carotid bruits, no thyromegaly, no lymphadenopathy.  Lungs:Clear bilaterally, no wheezes, rhonci, crackles Cardiovascular: Irreg irreg.  Abdomen:Soft. Bowel sounds present.  Extremities: No lower extremity edema.  Assessment & Plan   78 y.o. male with a hx of CKD stage IIIb (single kidney), hyperlipidemia, hypertension, paroxysmal atrial fibrillation, asthma, CAD by Lexiscan  10/2022, carotid artery disease s/p right carotid endarterectomy 08/2023 who was seen 11/18/2023 for the evaluation of atrial fibrillation with RVR.    Paroxysmal atrial fibrillation with RVR: He was initially placed on IV amiodarone which was stopped on 11/18/23. Labetalol  changed to metoprolol  on 11/19/23. Home Flecainide  restarted on 12/18/23. Today he is in rate controlled atrial fib. Will  continue Flecainide  and Metoprolol . He is now will to start a DOAC. Will stop IV heparin  and will start Xarelto .      Aneurysm of descending thoracic aorta s/p thoracic endovascular aortic repair for descending thoracic intramural hematoma 6/25: [facility] following   Post op ileus: Followed by primary team.    Lonni Cash, MD, FACC 11/20/2023 8:11 AM

## 2023-11-20 NOTE — Discharge Summary (Addendum)
 DISCHARGE SUMMARY  Jack [name]  MR#: [id number]  DOB:09-22-1945  Date of Admission: 11/08/2023 Date of Discharge: 11/20/2023  Attending Physician:Jack Grabe ONEIDA Moores, MD  Patient's ERE:Jack [name], Jack A, NP  Disposition: D/C home   Follow-up Appts:  Follow-up Information     Moon, Jack A, NP Follow up in 7 day(s).   Specialty: Internal Medicine Why: For [name] check of your blood pressure. Contact information: [address] [place] [address] [phone]         Jack [name] [place], MD Follow up.   Specialty: Cardiology Why: The office will call you to arrange for [name] follow up appointment. If you do not hear from the office within 5 days, give them [name] call. Contact information: [address] [place] [address] [phone]                 Tests Needing Follow-up: -f/u BP - strict control is [name] necessity  Discharge Diagnoses: Descending thoracic aortic aneurysm with dissection/intramural hematoma Postoperative ileus HTN CKD stage IIIb PAF with acute RVR Severe Malnutrition  Initial presentation: 78 year old with [name] history of CAD, CKD, HLD, HTN, PAF, and asthma who presented to the ER 6/20 with the sudden onset of upper back pain while sitting watching TV.  On arrival to the ER he was found to be hypertensive with blood pressure 180/120.  CXR was unrevealing.  CTa of the chest revealed [name] contained perforation of the proximal descending thoracic aorta and aneurysmal dilation of the descending thoracic aorta at 4.4 cm at the level of the hiatus.  The patient was started on esmolol  and nicardipine  drips and admitted to the hospital.   Hospital Course: 6/21 Presented with upper back pain imaging concerning for aortic dissection admitted to ICU for esmolol  and nicardipine  drips 6/23 Esmolol  changed to labetalol , esmolol  changed to cleviprex , labetalol  PO dose increased 6/24 Nausea/ileus, NGT placed in PM with emesis. 6/25 PO  antihypertensives transitioned to labetalol  gtt. 6/26 Early AM severe CP/back pain prompting repeat CT; concern for contained perf versus intraluminal hematoma. L radial [name]-line placed for BP monitoring. Taken to OR by [facility]VS for thoracic endovascular aortic repair.  6/29 transferred to [facility] 6/30 chest and abdominal pain with positive EKG changes -[name]-fib with RVR -amiodarone  drip initiated 7/1 clinically improving ileus   Descending thoracic aortic aneurysm with dissection/intramural hematoma Status post thoracic endovascular aortic repair (TEVAR) for intraluminal hematoma 11/13/23 - continue antiplatelet therapy and blood pressure control - f/u with Vascular Surgery has been arranged per V[facility] Surgery    Postoperative ileus Required short term use of NG tube - NG removed 7/1 - tolerated advancement of diet w/o difficulty    HTN Strict BP control and must -has been discussed with patient -blood pressure medications titrated during this hospital stay with good results -close outpatient follow-up of blood pressure indicated  CKD stage IIIb Creatinine improving at time of d/c and better than recent baseline    PAF with acute RVR Initially placed on amiodarone , with subsequent transition back to usual home flecainide  dose - Cardiology followed -continue metoprolol  - transitioned from IV heparin  to Xarelto  at time of d/c - f/u w/ Cardiology as per their instructions   Nutrition Problem: Severe Malnutrition Etiology: acute illness (aortic syndrome, aortic dissection) Signs/Symptoms: energy intake < or equal to 50% for > or equal to 5 days, severe muscle depletion, moderate muscle depletion Interventions: Refer to RD note for recommendations    Allergies as of 11/20/2023  Reactions   Roxicodone  [oxycodone ] Nausea And Vomiting   Pravachol  [pravastatin ] Other (See Comments)   Myalgias    Sulfa Antibiotics Itching   Zohydro Er [hydrocodone Bitartrate Er] Nausea Only   Eliquis  [apixaban ] Itching,  Rash   Hydrochlorothiazide Anxiety   Hytrin [terazosin Hcl] Palpitations, Other (See Comments)   Heart races   Pradaxa  [dabigatran  Etexilate Mesylate] Itching, Rash   Xarelto  [rivaroxaban ] Itching, Rash        Medication List     STOP taking these medications    labetalol  200 MG tablet Commonly known as: NORMODYNE        TAKE these medications    acetaminophen  500 MG tablet Commonly known as: TYLENOL  Take 500 mg by mouth every 6 (six) hours as needed for mild pain (pain score 1-3), moderate pain (pain score 4-6) or headache.   amLODipine  10 MG tablet Commonly known as: NORVASC  Take 1 tablet (10 mg total) by mouth every evening. What changed:  medication strength how much to take when to take this   aspirin  EC 81 MG tablet Take 81 mg by mouth every evening.   betamethasone  dipropionate 0.05 % lotion Apply 1 application  topically 2 (two) times daily as needed (Psoriasis).   COQ10 PO Take 1 capsule by mouth every evening.   FISH OIL PO Take 1 capsule by mouth daily.   flecainide  50 MG tablet Commonly known as: TAMBOCOR  Take 1 (one)  Tablet by mouth two times daily   isosorbide  mononitrate 30 MG 24 hr tablet Commonly known as: IMDUR  Take 1 tablet (30 mg total) by mouth daily.   KENALOG  IJ Inject 1 Dose into the muscle every 3 (three) months.   loratadine 10 MG tablet Commonly known as: CLARITIN Take 10 mg by mouth daily.   metoprolol  tartrate 50 MG tablet Commonly known as: LOPRESSOR  Take 1 tablet (50 mg total) by mouth 2 (two) times daily.   nitroGLYCERIN  0.4 MG SL tablet Commonly known as: NITROSTAT  Place 1 tablet (0.4 mg total) under the tongue every 5 (five) minutes as needed for chest pain.   pantoprazole  40 MG tablet Commonly known as: PROTONIX  Take 1 tablet (40 mg total) by mouth 2 (two) times daily.   Repatha  SureClick 140 MG/ML Soaj Generic drug: Evolocumab  Inject 140 mg into the skin every 14 (fourteen) days. What changed:  when to  take this additional instructions   Rivaroxaban  15 MG Tabs tablet Commonly known as: XARELTO  Take 1 tablet (15 mg total) by mouth daily. Start taking on: November 21, 2023   tamsulosin  0.4 MG Caps capsule Commonly known as: FLOMAX  Take 1 capsule (0.4 mg total) by mouth daily. Start taking on: November 21, 2023   triamcinolone  cream 0.1 % Commonly known as: KENALOG  Apply topically 2 (two) times daily. What changed:  how much to take when to take this reasons to take this   VITAMIN D-3 PO Take 1 capsule by mouth daily.        Day of Discharge BP 119/87 (BP Location: Right Arm)   Pulse 94   Temp 97.7 F (36.5 C) (Oral)   Resp 20   Ht 5' 10 (1.778 m)   Wt 67.8 kg   SpO2 96%   BMI 21.45 kg/m   Physical Exam: General: No acute respiratory distress Lungs: Clear to auscultation bilaterally without wheezes or crackles Cardiovascular: Regular rate and rhythm without murmur gallop or rub normal S1 and S2 Abdomen: Nontender, nondistended, soft, bowel sounds positive, no rebound, no ascites, no appreciable mass  Extremities: No significant cyanosis, clubbing, or edema bilateral lower extremities  Basic Metabolic Panel: Recent Labs  Lab 11/14/23 0301 11/14/23 1236 11/15/23 0206 11/15/23 1403 11/16/23 0339 11/17/23 1028 11/18/23 0346 11/19/23 0318 11/19/23 0319 11/20/23 0301  NA 134* 137 137 137 140 142 142 141 141 138  K 4.1 3.8 3.5 3.9 3.5 3.5 3.3* 3.3* 3.3* 4.0  CL 98 97* 99 100 103 104 106 107 106 105  CO2 19* 22 24 23 23 24 22 24 23 24   GLUCOSE 90 118* 96 114* 106* 109* 100* 102* 102* 110*  BUN 58* 58* 55* 53* 49* 39* 35* 35* 35* 30*  CREATININE 3.08* 3.02* 2.57* 2.59* 2.13* 1.72* 1.70* 1.71* 1.73* 1.51*  CALCIUM  9.1 8.7* 8.6* 8.7* 8.8* 8.6* 8.6* 8.3* 8.3* 8.0*  MG 2.4 2.2 2.4 2.5* 2.5*  --   --   --   --   --   PHOS 5.8*  --  4.4  --  2.9  --  2.0*  --  2.5 1.5*    CBC: Recent Labs  Lab 11/14/23 1236 11/15/23 0206 11/16/23 0339 11/19/23 0318 11/20/23 0301   WBC 6.7 6.1 9.3 11.5* 11.3*  HGB 14.1 14.6 14.7 13.7 13.0  HCT 40.2 41.6 43.2 40.3 38.7*  MCV 88.5 88.9 90.0 89.0 89.4  PLT 229 213 234 272 263    Time spent in discharge (includes decision making & examination of pt): 35 minutes  11/20/2023, 3:13 PM   Reyes IVAR Moores, MD [facility] Office  [phone]

## 2023-11-20 NOTE — Care Management Important Message (Signed)
 Important Message  Patient Details  Name: REMO KIRSCHENMANN MRN: [id number] Date of Birth: [birth year]   Important Message Given:  Yes - Medicare IM     Vonzell Arrie Sharps 11/20/2023, 10:08 AM

## 2023-11-20 NOTE — Progress Notes (Signed)
 Physical Therapy Treatment Patient Details Name: Jack [name] MRN: [id number] DOB: Nov 03, 1945 Today's Date: 11/20/2023   History of Present Illness 10M who was admitted for proximal descending thoracic aortic dissection. 6/24 NGT placed.  6/26 TEVAR PMH: CAD, HTN, CKD, asthma, and PAF    PT Comments  Pt received in supine, agreeable to therapy session and with good participation and improved tolerance for transfer/gait training with RW support. Pt HR more stable this session, 90's bpm seated and <110 bpm with exertional tasks (gait trial). Pt performs STS and ~244ft gait trial with RW support x2 with seated break with up to Supervision, and very elevated surface height. Disposition and DME discussed with pt/spouse and supervising PT Dawn M, pt and spouse confident now that they can manage at home and requesting to change recommendation to HHPT, DME also updated below, pt given gait belt also in previous session. Pt continues to benefit from PT services to progress toward functional mobility goals.     If plan is discharge home, recommend the following: A lot of help with bathing/dressing/bathroom;Assist for transportation;Help with stairs or ramp for entrance;A little help with walking and/or transfers   Can travel by private vehicle        Equipment Recommendations  Rolling walker (2 wheels);BSC/3in1    Recommendations for Other Services       Precautions / Restrictions Precautions Precautions: Fall Recall of Precautions/Restrictions: Impaired Precaution/Restrictions Comments: watch HR Restrictions Weight Bearing Restrictions Per Provider Order: No     Mobility  Bed Mobility Overal bed mobility: Modified Independent Bed Mobility: Supine to Sit     Supine to sit: HOB elevated, Used rails     General bed mobility comments: to L EOB, pt using bil side rails to pull up trunk    Transfers Overall transfer level: Needs assistance Equipment used: Rolling walker (2  wheels) Transfers: Sit to/from Stand Sit to Stand: Supervision, From elevated surface           General transfer comment: elevated bed>RW with supervision x2; needs cues to reach back for safety with stand>sit    Ambulation/Gait Ambulation/Gait assistance: Supervision Gait Distance (Feet): 200 Feet (x2) Assistive device: Rolling walker (2 wheels) Gait Pattern/deviations: Step-through pattern       General Gait Details: x2 trials with seated break between; HR WFL, SpO2 WFL on RA; pt with fair RW management, min cues for closer proximity to [employer] Mobility     Tilt Bed    Modified Rankin (Stroke Patients Only)       Balance Overall balance assessment: Needs assistance Sitting-balance support: Feet supported, No upper extremity supported Sitting balance-Leahy Scale: Fair Sitting balance - Comments: EOB   Standing balance support: Reliant on assistive device for balance, During functional activity Standing balance-Leahy Scale: Poor Standing balance comment: reliant on external support and RW                            Communication Communication Communication: Impaired Factors Affecting Communication: Hearing impaired (hears better on his R ear)  Cognition Arousal: Alert Behavior During Therapy: WFL for tasks assessed/performed                           PT - Cognition Comments: Pt with some decreased carryover of safety cues for UE placement during session. Following commands: Impaired Following  commands impaired: Only follows one step commands consistently    Cueing Cueing Techniques: Verbal cues, Gestural cues  Exercises      General Comments General comments (skin integrity, edema, etc.): HR 92 bpm sitting EOB and ~102 bpm w/exertion. BP 124/82 (94) siting EOB SpO2 WFL on RA      Pertinent Vitals/Pain Pain Assessment Pain Assessment: No/denies pain Pain Intervention(s): Monitored during session     Home Living                          Prior Function            PT Goals (current goals can now be found in the care plan section) Acute Rehab PT Goals Patient Stated Goal: To feel better PT Goal Formulation: With patient/family Time For Goal Achievement: 11/25/23 Progress towards PT goals: Progressing toward goals    Frequency    Min 3X/week      PT Plan      Co-evaluation              AM-PAC PT 6 Clicks Mobility   Outcome Measure  Help needed turning from your back to your side while in a flat bed without using bedrails?: A Little Help needed moving from lying on your back to sitting on the side of a flat bed without using bedrails?: A Little Help needed moving to and from a bed to a chair (including a wheelchair)?: A Little Help needed standing up from a chair using your arms (e.g., wheelchair or bedside chair)?: A Lot Help needed to walk in hospital room?: A Little Help needed climbing 3-5 steps with a railing? : A Lot 6 Click Score: 16    End of Session Equipment Utilized During Treatment: Gait belt Activity Tolerance: Patient tolerated treatment well Patient left: in bed;with call bell/phone within reach;with family/visitor present (pt requesting to sit EOB for a couple mins) Nurse Communication: Mobility status PT Visit Diagnosis: Unsteadiness on feet (R26.81);Muscle weakness (generalized) (M62.81);Difficulty in walking, not elsewhere classified (R26.2);Other abnormalities of gait and mobility (R26.89)     Time: 8841-8782 PT Time Calculation (min) (ACUTE ONLY): 19 min  Charges:    $Gait Training: 8-22 mins PT General Charges $$ ACUTE PT VISIT: 1 Visit                     Conda Wannamaker P., PTA Acute Rehabilitation Services Secure Chat Preferred 9a-5:30pm Office: [phone]    Connell HERO [facility] 11/20/2023, 1:05 PM

## 2023-11-20 NOTE — TOC Transition Note (Signed)
 Transition of Care [facility]) - Discharge Note Rayfield Gobble RN, BSN Transitions of Care Unit 4E- RN Case Manager See Treatment Team for direct phone #   Patient Details  Name: Jack [name] MRN: [id number] Date of Birth: [birth year]  Transition of Care [facility]) CM/SW Contact:  Gobble Rayfield Hurst, RN Phone Number: 11/20/2023, 4:36 PM   Clinical Narrative:    Pt stable for transition home today, Recs changed per PT/OT to [facility] follow up, DME- RW and BSC.  Orders requested from MD  CM spoke with pt and wife at bedside regarding HH and DME needs- pt up in room independently. Per pt and wife- voiced that they feel pt close to baseline and don't feel he needs HH  at this time, also declining DME at this time. They are agreeable to have printed DME orders in case pt changes his mind about RW and BSC when he gets home.   Printed orders for DME provided to wife. Explained she would have to take them to DME store to have DME processed under insurance- wife voiced understanding.   New to Xarelto - copay $47  Wife to transport home  No further TOC needs noted.    Final next level of care: Home/Self Care Barriers to Discharge: Barriers Resolved   Patient Goals and CMS Choice Patient states their goals for this hospitalization and ongoing recovery are:: return home CMS Medicare.gov Compare Post Acute Care list provided to:: Patient Choice offered to / list presented to : Patient, Spouse      Discharge Placement               Home        Discharge Plan and Services Additional resources added to the After Visit Summary for     Discharge Planning Services: CM Consult Post Acute Care Choice: Durable Medical Equipment, Home Health          DME Arranged: Bedside commode, Walker rolling DME Agency: Other - Comment (pt wanted to wait on getting DME -printed orders provided)       HH Arranged: Patient Refused HH, PT, OT HH Agency: NA        Social Drivers of Health (SDOH)  Interventions SDOH Screenings   Food Insecurity: No Food Insecurity (11/10/2023)  Housing: Low Risk  (11/10/2023)  Transportation Needs: No Transportation Needs (11/10/2023)  Utilities: Not At Risk (11/10/2023)  Social Connections: Unknown (11/11/2023)  Tobacco Use: Medium Risk (11/14/2023)     Readmission Risk Interventions    11/20/2023    4:36 PM  Readmission Risk Prevention Plan  Transportation Screening Complete  HRI or Home Care Consult Complete  Social Work Consult for Recovery Care Planning/Counseling Complete  Palliative Care Screening Not Applicable  Medication Review Ocean[facility]) Complete

## 2023-11-22 NOTE — Progress Notes (Signed)
 Removed L internal jugular central line. Pt tolerated well.

## 2023-11-23 ENCOUNTER — Other Ambulatory Visit: Payer: Self-pay | Admitting: Internal Medicine

## 2023-11-23 MED ORDER — LACTULOSE 10 GM/15ML PO SOLN: 10.0000 g | Freq: Three times a day (TID) | ORAL | 2 refills | Status: DC | PRN

## 2023-11-23 NOTE — Progress Notes (Signed)
   Contacted by nursing unit after patient/wife called the floor.  Patient recently discharged from 4 E.  Patient having some constipation at home.  Reported having very good results with lactulose  use during recent hospital stay.  Requests prescription for lactulose  as needed at home.  No contraindications to lactulose  use.  No symptoms by history worrisome for more complicated abdominal situation.  Prescription for lactulose  as needed transmitted to the pharmacy of his choice.  Reyes IVAR Moores, MD [facility] Office  [phone]

## 2023-11-25 ENCOUNTER — Other Ambulatory Visit: Payer: Self-pay

## 2023-11-25 ENCOUNTER — Other Ambulatory Visit (HOSPITAL_BASED_OUTPATIENT_CLINIC_OR_DEPARTMENT_OTHER): Payer: Self-pay

## 2023-11-25 DIAGNOSIS — I71012 Dissection of descending thoracic aorta: Secondary | ICD-10-CM

## 2023-11-26 ENCOUNTER — Other Ambulatory Visit: Payer: Self-pay | Admitting: Physician Assistant

## 2023-11-26 ENCOUNTER — Telehealth: Payer: Self-pay

## 2023-11-26 MED ORDER — TRAMADOL HCL 50 MG PO TABS: 50.0000 mg | ORAL_TABLET | Freq: Four times a day (QID) | ORAL | 0 refills | Status: AC | PRN

## 2023-11-26 NOTE — Telephone Encounter (Signed)
 The patients spouse called 11/25/23 stating that Jack [name] is experiencing burning stomach pain since his procedure on 11/14/2023. She says he is eating normally and denies changes bowel habits, Nausea, vomiting and fever or chills.   PA McKenzie Schuh consulted, she ordered Tramadol  which was sent to,   [facility] - [place], [place] - [address] [place], [place] [address]       The patient' spouse was instructed to go to ER if the patient's condition does not improve. She verbalized understanding of above directions.

## 2023-11-26 NOTE — Telephone Encounter (Signed)
 See telephone note 11/26/2023.

## 2023-11-27 ENCOUNTER — Ambulatory Visit (HOSPITAL_BASED_OUTPATIENT_CLINIC_OR_DEPARTMENT_OTHER): Admission: EM | Admit: 2023-11-27 | Discharge: 2023-11-27 | Disposition: A | Discharge status: Home / Self Care

## 2023-11-28 NOTE — Progress Notes (Signed)
 11/29/2023 Jack [name] [id number] 1945/06/19  Referring provider: Erick Greig LABOR, NP Primary GI doctor: Dr. Charlanne  ASSESSMENT AND PLAN:  Abnormal CT with history of GERD  11/14/2023 CT chest abdomen pelvis without contrast showed fluid dilation of stomach and small bowel terminal ileal segment without discrete transition likely ileus or mechanical obstruction at the ileocecal junction 04/19/2020 EGD small hiatal hernia, mild gastritis no evidence of upper GI bleeding, negative H. pylori, negative celiac Was not on pantoprazole  40 mg, started on pantoprazole  40 mg BID After his thoracic endovascular aortic repair starts with right flank/RUQ burning pain that radiates around his AB, burning pain, some back pain Last 15 mins or longer No associated symptoms, general weakness No nausea, no vomiting, GERD, dysphagia, no urinary issues, no melena, no hematochezia Uncertain if this is GI related versus MSK, versus nerve, versus other - check labs, CBC, CMET, INR, iron ferritin - continue pantoprazole  40 mg BID - can try salon pas patches - can add on levsin  0.125 mcg as needed - add on pepcid  at night - schedule for STAT AB US  to rule out hematoma, etc but Jack [name] declined, scheduled for Monday, Er precautions discussed  Descending thoracic aortic aneurysm with dissection and intramural hematoma admission 6/21 through 11/20/2023 at [facility] 11/14/2023 thoracic endovascular aortic repair Scheduled for CT angio chest/AB/pelvis 08/07  Constipation with ileus Treated with NG tube Passing gas, having Bm's, soft AB on exam today  Atrial fibrillation with history of TIA Had RVR while in the hospital 6/30 Currently on Xarelto   CKD stage IIIb Will check kidney function  CAD/PAD Status post right carotid enterectomy 08/2023  Screening colonoscopy Colonoscopy 10/17/2017: Mild pancolonic diverticulosis, nonbleeding internal hemorrhoids.  No need to repeat unless problems.    Malnutrition   Jack [name] Care Team: Erick Greig LABOR, NP as PCP - General (Internal Medicine) Verlin Lonni BIRCH, MD as PCP - Cardiology (Cardiology) Delford Maude BROCKS, MD (Cardiology) Douglass Lunger, MD (Nephrology) Renda Glance, MD (Urology)  HISTORY OF PRESENT ILLNESS: Jack [name] with a past medical history listed below presents for evaluation of reflux.   Jack [name] was last seen in the office 03/16/2020 by Dr. Charlanne for melena.  Discussed the use of AI scribe software for clinical note transcription with the Jack [name], who gave verbal consent to proceed.  History of Present Illness   The Jack [name] presents with abdominal pain following recent thoracic endovascular aortic repair.  He experiences abdominal pain that began after undergoing thoracic endovascular aortic repair on November 14, 2023. The pain is described as a 'ring of fire' that can start in the right upper quadrant and sometimes radiates across the abdomen. It occurs daily and can last from 5 to 15 minutes or longer, with no clear triggers related to food intake or physical activity. The pain is not associated with nausea, vomiting, or changes in bowel habits, and there is no relief from Tylenol .  In 2021, he was evaluated for dark black stools, leading to an endoscopy that revealed mild inflammation and a small hiatal hernia, but no bleeding or H. pylori infection. He was initially placed on a proton pump inhibitor like Protonix , which he discontinued prior to a recent hospitalization. During the hospitalization, he was restarted on pantoprazole  twice daily.  No current symptoms of heartburn, reflux, or difficulty swallowing. No urinary issues, although he was started on Flomax  during his hospital stay. He feels extremely weak, describing it as 'super, super weak,' significantly impacting his daily activities. Despite this, he  maintains a regular appetite and denies any fever or chills.  Bowel movements are regular, with formed  stools, and no black or bloody stools. He is currently on Xarelto , a blood thinner, since his hospitalization, with no bleeding issues. He also frequently feels cold, attributing it to his 'high nature.'      He  reports that he quit smoking about 53 years ago. His smoking use included cigarettes. He has never used smokeless tobacco. He reports that he does not drink alcohol and does not use drugs.  RELEVANT GI HISTORY, IMAGING AND LABS: Results   DIAGNOSTIC Endoscopy: Inflammation, small hiatal hernia, negative H. pylori (2021) Thoracic endovascular aortic repair: Procedure completed (11/14/2023)     CT chest/AB/pelvis 11/14/2023 MPRESSION: 1. 2.5 x 0.9 cm subtle, possible extraluminal density such as a contained perforation, or intraluminal hematoma along the posterolateral wall of the proximal descending aorta, previously 2 x 1 cm. It is less dense than previously. 2. Stable aneurysmal dilatation of the distal descending aorta, 4.2 x 4.3 cm. 3. Increased consolidation in the posteromedial left lower lobe consistent with pneumonia or aspiration. 4. Increased small layering pleural effusions, slightly more so on the left. No pleural hemorrhage is seen. 5. NGT side hole in the distal esophagus and needs to be advanced further in 10 cm. 6. Emphysema. 7. Aortic and coronary artery atherosclerosis. 8. Fluid dilatation of the stomach and small bowel down to the terminal ileal segment without discrete transition. Findings could be due to ileus or mechanical obstruction at the ileocecal junction. The umbilicus was not included in the scan and an umbilical hernia would be missed. 9. Small volume of pelvic ascites. 10. Chronic right UPJ obstruction with severe hydronephrosis and advanced cortical atrophy. 11. Prostatomegaly. 12. Small bilateral inguinal fat hernias. CBC    Component Value Date/Time   WBC 11.3 (H) 11/20/2023 0301   RBC 4.33 11/20/2023 0301   HGB 13.0 11/20/2023 0301    HCT 38.7 (L) 11/20/2023 0301   PLT 263 11/20/2023 0301   MCV 89.4 11/20/2023 0301   MCH 30.0 11/20/2023 0301   MCHC 33.6 11/20/2023 0301   RDW 13.6 11/20/2023 0301   LYMPHSABS 2.3 11/08/2023 2111   MONOABS 1.2 (H) 11/08/2023 2111   EOSABS 0.3 11/08/2023 2111   BASOSABS 0.1 11/08/2023 2111   Recent Labs    11/10/23 0226 11/11/23 0238 11/12/23 0311 11/13/23 0348 11/14/23 0518 11/14/23 1236 11/15/23 0206 11/16/23 0339 11/19/23 0318 11/20/23 0301  HGB 14.2 13.8 12.6* 14.0 14.9 14.1 14.6 14.7 13.7 13.0    CMP     Component Value Date/Time   NA 138 11/20/2023 0301   K 4.0 11/20/2023 0301   CL 105 11/20/2023 0301   CO2 24 11/20/2023 0301   GLUCOSE 110 (H) 11/20/2023 0301   BUN 30 (H) 11/20/2023 0301   CREATININE 1.51 (H) 11/20/2023 0301   CALCIUM  8.0 (L) 11/20/2023 0301   PROT 5.3 (L) 11/11/2023 1101   ALBUMIN 2.3 (L) 11/20/2023 0301   AST 14 (L) 11/11/2023 1101   ALT 14 11/11/2023 1101   ALKPHOS 55 11/11/2023 1101   BILITOT 0.9 11/11/2023 1101   GFRNONAA 47 (L) 11/20/2023 0301   GFRAA 56 (L) 12/24/2013 1316      Latest Ref Rng & Units 11/20/2023    3:01 AM 11/19/2023    3:19 AM 11/18/2023    3:46 AM  Hepatic Function  Albumin 3.5 - 5.0 g/dL 2.3  2.4  2.5       Current Medications:  Current Outpatient Medications (Endocrine & Metabolic):    Triamcinolone  Acetonide (KENALOG  IJ), Inject 1 Dose into the muscle every 3 (three) months.  Current Outpatient Medications (Cardiovascular):    amLODipine  (NORVASC ) 10 MG tablet, Take 1 tablet (10 mg total) by mouth every evening.   Evolocumab  (REPATHA  SURECLICK) 140 MG/ML SOAJ, Inject 140 mg into the skin every 14 (fourteen) days. (Jack [name] taking differently: Inject 140 mg into the skin See admin instructions. Inject 1mL (140mg ) into the skin every other Saturday.)   Evolocumab  (REPATHA  SURECLICK) 140 MG/ML SOAJ, Inject 140 mg into the skin every 14 (fourteen) days.   flecainide  (TAMBOCOR ) 50 MG tablet, Take 1 (one)   Tablet by mouth two times daily   isosorbide  mononitrate (IMDUR ) 30 MG 24 hr tablet, Take 1 tablet (30 mg total) by mouth daily.   metoprolol  tartrate (LOPRESSOR ) 50 MG tablet, Take 1 tablet (50 mg total) by mouth 2 (two) times daily.   nitroGLYCERIN  (NITROSTAT ) 0.4 MG SL tablet, Place 1 tablet (0.4 mg total) under the tongue every 5 (five) minutes as needed for chest pain.  Current Outpatient Medications (Respiratory):    loratadine (CLARITIN) 10 MG tablet, Take 10 mg by mouth daily.  Current Outpatient Medications (Analgesics):    acetaminophen  (TYLENOL ) 500 MG tablet, Take 500 mg by mouth every 6 (six) hours as needed for mild pain (pain score 1-3), moderate pain (pain score 4-6) or headache.   aspirin  EC 81 MG tablet, Take 81 mg by mouth every evening.   traMADol  (ULTRAM ) 50 MG tablet, Take 1 tablet (50 mg total) by mouth every 6 (six) hours as needed.  Current Outpatient Medications (Hematological):    Rivaroxaban  (XARELTO ) 15 MG TABS tablet, Take 1 tablet (15 mg total) by mouth daily.  Current Outpatient Medications (Other):    betamethasone  dipropionate 0.05 % lotion, Apply 1 application  topically 2 (two) times daily as needed (Psoriasis).   Cholecalciferol (VITAMIN D-3 PO), Take 1 capsule by mouth daily.   Coenzyme Q10 (COQ10 PO), Take 1 capsule by mouth every evening.   famotidine  (PEPCID ) 40 MG tablet, Take 1 tablet (40 mg total) by mouth at bedtime.   hyoscyamine  (LEVSIN  SL) 0.125 MG SL tablet, Place 1 tablet (0.125 mg total) under the tongue every 6 (six) hours as needed for cramping (nausea, diarrhea).   lactulose  (CHRONULAC ) 10 GM/15ML solution, Take 15-45 mLs (10-30 g total) by mouth 3 (three) times daily as needed for mild constipation.   Omega-3 Fatty Acids (FISH OIL PO), Take 1 capsule by mouth daily.   pantoprazole  (PROTONIX ) 40 MG tablet, Take 1 tablet (40 mg total) by mouth 2 (two) times daily.   tamsulosin  (FLOMAX ) 0.4 MG CAPS capsule, Take 1 capsule (0.4 mg total) by  mouth daily.   triamcinolone  cream (KENALOG ) 0.1 %, Apply topically 2 (two) times daily. (Jack [name] taking differently: Apply 1 Application topically 2 (two) times daily as needed (skin irritation, itching).)  Medical History:  Past Medical History:  Diagnosis Date   Allergy    Arthritis    psoriatic arthritis    Asthma    Atheroembolic kidney disease (HCC)    Atheroembolism    CAD (coronary artery disease)    hx non obst   Cancer (HCC)    basal cell x3 , last one removed - 12/12/2013, R temple area of forehead   Carotid artery occlusion    Chronic kidney disease    renal stones - tx. /w lithotripsy   Complication of anesthesia    GERD (gastroesophageal  reflux disease)    HLD (hyperlipidemia)    HTN (hypertension)    Hypoglycemia    Paroxysmal atrial fibrillation (HCC)    PONV (postoperative nausea and vomiting)    Pruritus    Psoriasis    Shortness of breath    after eating , if he gets active, he might have some SOB   Vitamin D deficiency    Allergies:  Allergies  Allergen Reactions   Roxicodone  [Oxycodone ] Nausea And Vomiting   Pravachol  [Pravastatin ] Other (See Comments)    Myalgias    Sulfa Antibiotics Itching   Zohydro Er [Hydrocodone Bitartrate Er] Nausea Only   Eliquis  [Apixaban ] Itching and Rash   Hydrochlorothiazide Anxiety   Hytrin [Terazosin Hcl] Palpitations and Other (See Comments)    Heart races   Pradaxa  [Dabigatran  Etexilate Mesylate] Itching and Rash   Xarelto  [Rivaroxaban ] Itching and Rash     Surgical History:  He  has a past surgical history that includes AV fistula placement (07-2009); skin cancer removed; Lithotripsy; Ligation of arteriovenous  fistula (Left, 12/30/2013); Cardiac catheterization (2010); Colonoscopy (09/15/2012); Endarterectomy (Right, 08/20/2023); Patch angioplasty (Right, 08/20/2023); and Thoracic aortic endovascular stent graft (N/A, 11/14/2023). Family History:  His family history includes Asthma in his father; Cancer in his  mother; Coronary artery disease in an other family member; Heart disease in his brother and father.  REVIEW OF SYSTEMS  : All other systems reviewed and negative except where noted in the History of Present Illness.  PHYSICAL EXAM: BP 118/64   Pulse 68   Ht 5' 10 (1.778 m)   Wt 153 lb (69.4 kg)   BMI 21.95 kg/m  Physical Exam   GENERAL APPEARANCE: Well nourished, in no apparent distress. HEENT: No cervical lymphadenopathy, unremarkable thyroid , sclerae anicteric, conjunctiva pink. RESPIRATORY: Respiratory effort normal, breath sounds equal bilaterally without rales, rhonchi, or wheezing. Lungs clear to auscultation bilaterally. CARDIO: Regular rate and rhythm with no murmurs, rubs, or gallops. Peripheral pulses intact. ABDOMEN: Soft, non-distended, active bowel sounds in all four quadrants, no tenderness to palpation, no rebound, no mass appreciated. Umbilical hernia present. Bruising in right lower quadrant and right flank. RECTAL: Declines examination. MUSCULOSKELETAL: Full range of motion, normal gait, without edema. SKIN: Dry, intact without rashes or lesions. No jaundice. No bruising on back. NEURO: Alert, oriented, no focal deficits. PSYCH: Cooperative, normal mood and affect.      Alan JONELLE Coombs, PA-C 3:25 PM

## 2023-11-29 ENCOUNTER — Other Ambulatory Visit (HOSPITAL_BASED_OUTPATIENT_CLINIC_OR_DEPARTMENT_OTHER): Payer: Self-pay

## 2023-11-29 ENCOUNTER — Ambulatory Visit (HOSPITAL_COMMUNITY)

## 2023-11-29 ENCOUNTER — Encounter: Payer: Self-pay | Admitting: Physician Assistant

## 2023-11-29 ENCOUNTER — Other Ambulatory Visit (INDEPENDENT_AMBULATORY_CARE_PROVIDER_SITE_OTHER)

## 2023-11-29 ENCOUNTER — Ambulatory Visit: Payer: Self-pay | Admitting: Physician Assistant

## 2023-11-29 ENCOUNTER — Ambulatory Visit: Admitting: Physician Assistant

## 2023-11-29 ENCOUNTER — Other Ambulatory Visit: Payer: Self-pay | Admitting: *Deleted

## 2023-11-29 VITALS — BP 118/64 | HR 68 | Ht 70.0 in | Wt 153.0 lb

## 2023-11-29 DIAGNOSIS — R1084 Generalized abdominal pain: Secondary | ICD-10-CM

## 2023-11-29 DIAGNOSIS — N1832 Chronic kidney disease, stage 3b: Secondary | ICD-10-CM | POA: Diagnosis not present

## 2023-11-29 DIAGNOSIS — K5900 Constipation, unspecified: Secondary | ICD-10-CM

## 2023-11-29 DIAGNOSIS — Z8673 Personal history of transient ischemic attack (TIA), and cerebral infarction without residual deficits: Secondary | ICD-10-CM

## 2023-11-29 DIAGNOSIS — K567 Ileus, unspecified: Secondary | ICD-10-CM | POA: Diagnosis not present

## 2023-11-29 DIAGNOSIS — R109 Unspecified abdominal pain: Secondary | ICD-10-CM

## 2023-11-29 DIAGNOSIS — R933 Abnormal findings on diagnostic imaging of other parts of digestive tract: Secondary | ICD-10-CM | POA: Diagnosis not present

## 2023-11-29 DIAGNOSIS — R1011 Right upper quadrant pain: Secondary | ICD-10-CM | POA: Diagnosis not present

## 2023-11-29 DIAGNOSIS — K219 Gastro-esophageal reflux disease without esophagitis: Secondary | ICD-10-CM | POA: Diagnosis not present

## 2023-11-29 DIAGNOSIS — I71012 Dissection of descending thoracic aorta: Secondary | ICD-10-CM | POA: Diagnosis not present

## 2023-11-29 DIAGNOSIS — I480 Paroxysmal atrial fibrillation: Secondary | ICD-10-CM

## 2023-11-29 DIAGNOSIS — Z7901 Long term (current) use of anticoagulants: Secondary | ICD-10-CM

## 2023-11-29 DIAGNOSIS — I4891 Unspecified atrial fibrillation: Secondary | ICD-10-CM

## 2023-11-29 LAB — CBC WITH DIFFERENTIAL/PLATELET
Basophils Absolute: 0.1 K/uL (ref 0.0–0.1)
Basophils Relative: 0.5 % (ref 0.0–3.0)
Eosinophils Absolute: 0.3 K/uL (ref 0.0–0.7)
Eosinophils Relative: 3 % (ref 0.0–5.0)
HCT: 39 % (ref 39.0–52.0)
Hemoglobin: 12.9 g/dL — ABNORMAL LOW (ref 13.0–17.0)
Lymphocytes Relative: 22.8 % (ref 12.0–46.0)
Lymphs Abs: 2.6 K/uL (ref 0.7–4.0)
MCHC: 33.2 g/dL (ref 30.0–36.0)
MCV: 90 fl (ref 78.0–100.0)
Monocytes Absolute: 1.9 K/uL — ABNORMAL HIGH (ref 0.1–1.0)
Monocytes Relative: 16.3 % — ABNORMAL HIGH (ref 3.0–12.0)
Neutro Abs: 6.7 K/uL (ref 1.4–7.7)
Neutrophils Relative %: 57.4 % (ref 43.0–77.0)
Platelets: 450 K/uL — ABNORMAL HIGH (ref 150.0–400.0)
RBC: 4.33 Mil/uL (ref 4.22–5.81)
RDW: 14.9 % (ref 11.5–15.5)
WBC: 11.6 K/uL — ABNORMAL HIGH (ref 4.0–10.5)

## 2023-11-29 LAB — SEDIMENTATION RATE: Sed Rate: 22 mm/h — ABNORMAL HIGH (ref 0–20)

## 2023-11-29 MED ORDER — FAMOTIDINE 40 MG PO TABS
40.0000 mg | ORAL_TABLET | Freq: Every day | ORAL | 2 refills | Status: AC
  Filled 2023-11-29: qty 90, 90d supply, fill #0

## 2023-11-29 MED ORDER — HYOSCYAMINE SULFATE 0.125 MG SL SUBL
0.1250 mg | SUBLINGUAL_TABLET | Freq: Four times a day (QID) | SUBLINGUAL | 1 refills | Status: DC | PRN
  Filled 2023-11-29: qty 50, 13d supply, fill #0

## 2023-11-29 NOTE — Patient Instructions (Addendum)
 You have been scheduled for an abdominal ultrasound at [facility] on Monday 14th at 2:30 pm Please arrive 30 minutes prior to your appointment  Make certain not to have anything else to eat or drink  after 8 hours prior to your appointment. Should you need to reschedule your appointment, please contact radiology at [phone]. This test typically takes about 30 minutes to perform.   VISIT SUMMARY:  You came in today because of abdominal pain that started after your recent thoracic endovascular aortic repair surgery. You described the pain as a 'ring of fire' that can start in the right upper quadrant and sometimes radiates across your abdomen. You also mentioned feeling extremely weak, which has significantly impacted your daily activities. Despite these issues, you have a regular appetite and no other significant symptoms like nausea, vomiting, or changes in bowel habits.  YOUR PLAN:  -ABDOMINAL PAIN POST-SURGERY: Your abdominal pain may be due to stress-related gastritis or irritation of muscles or nerves following your surgery. You should continue taking pantoprazole  twice daily to help manage this. We will also monitor for any signs of complications like rash, bruising, or dark black stool. Blood tests will be done to check your complete blood count, kidney and liver function, and iron levels. An abdominal ultrasound may be considered after consulting with another physician. You are also prescribed an antispasmodic medication to use as needed and can use a Salonpas patch for relief.  -RIGHT FLANK PAIN: Your right flank pain is likely related to muscle or nerve irritation from your recent surgery. Similar to your abdominal pain, an abdominal ultrasound may be considered after consulting with another physician. You are prescribed an antispasmodic medication to use as needed and can use a Salonpas patch for relief.  -WEAKNESS: Your severe weakness, especially during episodes of abdominal  pain, is concerning. Blood tests will be done to check your complete blood count, kidney and liver function, and iron levels.  -USE OF PROTONIX  (PANTOPRAZOLE ): You should continue taking pantoprazole  twice daily as it helps manage your symptoms. There are no significant concerns about its impact on kidney function or dementia risk. However, long-term use may affect the absorption of certain vitamins and minerals, so we will monitor your vitamin B12, vitamin D, and magnesium  levels if you continue using it for an extended period.  INSTRUCTIONS:  Please continue taking pantoprazole  twice daily and use the prescribed antispasmodic medication as needed. Apply a Salonpas patch for symptomatic relief. Monitor for any signs of complications such as rash, bruising, or dark black stool. We will conduct blood tests to check your complete blood count, kidney and liver function, and iron levels. An abdominal ultrasound may be considered after consulting with another physician. Follow up with us  if you experience any new or worsening symptoms.  Due to recent changes in healthcare laws, you may see the results of your imaging and laboratory studies on MyChart before your provider has had a chance to review them.  We understand that in some cases there may be results that are confusing or concerning to you. Not all laboratory results come back in the same time frame and the provider may be waiting for multiple results in order to interpret others.  Please give us  48 hours in order for your provider to thoroughly review all the results before contacting the office for clarification of your results.   Your provider has requested that you go to the basement level for lab work before leaving today. Press B on the  elevator. The lab is located at the first door on the left as you exit the elevator.    I appreciate the  opportunity to care for you  Thank You   [facility]

## 2023-12-02 ENCOUNTER — Ambulatory Visit (INDEPENDENT_AMBULATORY_CARE_PROVIDER_SITE_OTHER)
Admission: RE | Admit: 2023-12-02 | Discharge: 2023-12-02 | Disposition: A | Discharge status: Home / Self Care | Source: Ambulatory Visit | Attending: Physician Assistant | Admitting: Physician Assistant

## 2023-12-02 ENCOUNTER — Other Ambulatory Visit: Payer: Self-pay | Admitting: Physician Assistant

## 2023-12-02 DIAGNOSIS — N1832 Chronic kidney disease, stage 3b: Secondary | ICD-10-CM | POA: Diagnosis not present

## 2023-12-02 DIAGNOSIS — N1330 Unspecified hydronephrosis: Secondary | ICD-10-CM | POA: Diagnosis not present

## 2023-12-02 DIAGNOSIS — R109 Unspecified abdominal pain: Secondary | ICD-10-CM

## 2023-12-02 DIAGNOSIS — R1084 Generalized abdominal pain: Secondary | ICD-10-CM | POA: Diagnosis not present

## 2023-12-02 DIAGNOSIS — I700 Atherosclerosis of aorta: Secondary | ICD-10-CM | POA: Diagnosis not present

## 2023-12-02 DIAGNOSIS — N281 Cyst of kidney, acquired: Secondary | ICD-10-CM | POA: Diagnosis not present

## 2023-12-03 ENCOUNTER — Ambulatory Visit (HOSPITAL_COMMUNITY)
Admission: RE | Admit: 2023-12-03 | Discharge: 2023-12-03 | Disposition: A | Discharge status: Home / Self Care | Source: Ambulatory Visit | Attending: Physician Assistant | Admitting: Physician Assistant

## 2023-12-03 ENCOUNTER — Encounter (HOSPITAL_COMMUNITY): Payer: Self-pay

## 2023-12-03 ENCOUNTER — Ambulatory Visit: Payer: Self-pay | Admitting: Physician Assistant

## 2023-12-03 DIAGNOSIS — I71012 Dissection of descending thoracic aorta: Secondary | ICD-10-CM

## 2023-12-03 DIAGNOSIS — N1330 Unspecified hydronephrosis: Secondary | ICD-10-CM | POA: Diagnosis not present

## 2023-12-03 DIAGNOSIS — N1832 Chronic kidney disease, stage 3b: Secondary | ICD-10-CM

## 2023-12-03 DIAGNOSIS — R109 Unspecified abdominal pain: Secondary | ICD-10-CM | POA: Insufficient documentation

## 2023-12-03 DIAGNOSIS — D72829 Elevated white blood cell count, unspecified: Secondary | ICD-10-CM | POA: Diagnosis not present

## 2023-12-03 DIAGNOSIS — I7143 Infrarenal abdominal aortic aneurysm, without rupture: Secondary | ICD-10-CM | POA: Diagnosis not present

## 2023-12-03 LAB — COMPREHENSIVE METABOLIC PANEL WITH GFR
ALT: 34 IU/L (ref 0–44)
AST: 29 IU/L (ref 0–40)
Albumin: 3.5 g/dL — ABNORMAL LOW (ref 3.8–4.8)
Alkaline Phosphatase: 175 IU/L — ABNORMAL HIGH (ref 44–121)
BUN/Creatinine Ratio: 12 (ref 10–24)
BUN: 20 mg/dL (ref 8–27)
Bilirubin Total: 0.5 mg/dL (ref 0.0–1.2)
CO2: 16 mmol/L — ABNORMAL LOW (ref 20–29)
Calcium: 8.9 mg/dL (ref 8.6–10.2)
Chloride: 106 mmol/L (ref 96–106)
Creatinine, Ser: 1.67 mg/dL — ABNORMAL HIGH (ref 0.76–1.27)
Globulin, Total: 2.5 g/dL (ref 1.5–4.5)
Glucose: 92 mg/dL (ref 70–99)
Potassium: 4.1 mmol/L (ref 3.5–5.2)
Sodium: 139 mmol/L (ref 134–144)
Total Protein: 6 g/dL (ref 6.0–8.5)
eGFR: 42 mL/min/1.73 — ABNORMAL LOW (ref >59)

## 2023-12-03 NOTE — Telephone Encounter (Signed)
 Discussed with Dr. Charlanne, will cancel xray's and proceed with CT chest AB pelvis without contrast stat to evaluate for abscess/complication from recent endovascular hernia repair. Continue with the labs selected.

## 2023-12-03 NOTE — Telephone Encounter (Signed)
 Patient's wife is returning call to speak with nurse about previous conversation. Patient was scheduled to see PCP today and the appointment was cancelled due to her provider having a family emergency. Patient's wife wants to know what should they do now that they can't get the urine sample and Xray. Please advise.

## 2023-12-04 ENCOUNTER — Ambulatory Visit: Payer: Self-pay | Admitting: Physician Assistant

## 2023-12-04 NOTE — Telephone Encounter (Signed)
 Patient spouse calling in regards to results. Please advise.   Thank you

## 2023-12-05 ENCOUNTER — Ambulatory Visit

## 2023-12-05 ENCOUNTER — Encounter (HOSPITAL_COMMUNITY)

## 2023-12-09 ENCOUNTER — Telehealth: Payer: Self-pay | Admitting: Physician Assistant

## 2023-12-09 DIAGNOSIS — K9189 Other postprocedural complications and disorders of digestive system: Secondary | ICD-10-CM | POA: Diagnosis not present

## 2023-12-09 DIAGNOSIS — N189 Chronic kidney disease, unspecified: Secondary | ICD-10-CM | POA: Diagnosis not present

## 2023-12-09 DIAGNOSIS — K567 Ileus, unspecified: Secondary | ICD-10-CM | POA: Diagnosis not present

## 2023-12-09 DIAGNOSIS — I71012 Dissection of descending thoracic aorta: Secondary | ICD-10-CM | POA: Diagnosis not present

## 2023-12-09 DIAGNOSIS — N179 Acute kidney failure, unspecified: Secondary | ICD-10-CM | POA: Diagnosis not present

## 2023-12-09 DIAGNOSIS — I10 Essential (primary) hypertension: Secondary | ICD-10-CM | POA: Diagnosis not present

## 2023-12-09 DIAGNOSIS — I480 Paroxysmal atrial fibrillation: Secondary | ICD-10-CM | POA: Diagnosis not present

## 2023-12-09 NOTE — Telephone Encounter (Signed)
Left message for pt wife to call back

## 2023-12-09 NOTE — Telephone Encounter (Signed)
 Inbound call from patient's wife, states she wanted to confirm with Alan is the patient still needs to have the Xray done being that he had a CT scan done recently. She is also requesting for orders for labs and urinalysis to be sent to [facility] in [place] due to the long distance for labs.

## 2023-12-09 NOTE — Telephone Encounter (Signed)
 Inbound call from wife, returning call.

## 2023-12-10 NOTE — Telephone Encounter (Signed)
 Pt wife stated that she wanted to ensure that Jack [name] no longer requested an xray since pt had the recent CT scan.  Pt wife stated that pt had labs drawn yesterday at his PCP office.

## 2023-12-11 ENCOUNTER — Ambulatory Visit (HOSPITAL_BASED_OUTPATIENT_CLINIC_OR_DEPARTMENT_OTHER)
Admission: EM | Admit: 2023-12-11 | Discharge: 2023-12-11 | Disposition: A | Discharge status: Home / Self Care | Attending: Family Medicine | Admitting: Family Medicine

## 2023-12-11 ENCOUNTER — Ambulatory Visit (INDEPENDENT_AMBULATORY_CARE_PROVIDER_SITE_OTHER)
Admit: 2023-12-11 | Discharge: 2023-12-11 | Disposition: A | Discharge status: Home / Self Care | Attending: Family Medicine | Admitting: Family Medicine

## 2023-12-11 ENCOUNTER — Encounter (HOSPITAL_BASED_OUTPATIENT_CLINIC_OR_DEPARTMENT_OTHER): Payer: Self-pay | Admitting: Emergency Medicine

## 2023-12-11 DIAGNOSIS — M79671 Pain in right foot: Secondary | ICD-10-CM | POA: Diagnosis not present

## 2023-12-11 DIAGNOSIS — S99921A Unspecified injury of right foot, initial encounter: Secondary | ICD-10-CM

## 2023-12-11 DIAGNOSIS — W19XXXA Unspecified fall, initial encounter: Secondary | ICD-10-CM | POA: Diagnosis not present

## 2023-12-11 NOTE — ED Provider Notes (Signed)
 Jack [name]    CSN: [id number] Arrival date & time: 12/11/23  1244      History   Chief Complaint Chief Complaint  Patient presents with   Fall   Foot Injury    HPI Jack [name] is a 78 y.o. male.   Patient is a 78 year old male presents today with right foot injury.  Pt reports he was moving too fast and the next thing he knew he was falling face first.  He caught his self with the palm of his hands and his toes.  This occurred yesterday. Pt has a knot at the base of his toes on right foot and bruising noted.  Denies any numbness, tingling   Fall  Foot Injury   Past Medical History:  Diagnosis Date   Allergy    Arthritis    psoriatic arthritis    Asthma    Atheroembolic kidney disease (HCC)    Atheroembolism    CAD (coronary artery disease)    hx non obst   Cancer (HCC)    basal cell x3 , last one removed - 12/12/2013, R temple area of forehead   Carotid artery occlusion    Chronic kidney disease    renal stones - tx. /w lithotripsy   Complication of anesthesia    GERD (gastroesophageal reflux disease)    HLD (hyperlipidemia)    HTN (hypertension)    Hypoglycemia    Paroxysmal atrial fibrillation (HCC)    PONV (postoperative nausea and vomiting)    Pruritus    Psoriasis    Shortness of breath    after eating , if he gets active, he might have some SOB   Vitamin D deficiency     Patient Active Problem List   Diagnosis Date Noted   Atrial fibrillation with RVR (HCC) 11/19/2023   Protein-calorie malnutrition, severe 11/19/2023   Ileus (HCC) 11/17/2023   Essential hypertension 11/17/2023   Chronic kidney disease, stage 3b (HCC) 11/17/2023   Aortic anomaly 11/14/2023   Dissecting aneurysm of descending thoracic aorta (HCC) 11/09/2023   Carotid artery stenosis 08/20/2023   Soreness-type pain-Left arm  01/01/2014   Swelling of limb-Left arm  01/01/2014   Visit for wound check 01/01/2014   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,  implant, and graft 02/06/2011   PAF (paroxysmal atrial fibrillation) (HCC) 01/31/2011   GERD 09/16/2009   Disorder resulting from impaired renal function 07/15/2009   HYPERTENSION, BENIGN 06/28/2009   CAD, NATIVE VESSEL 06/28/2009   ATHEROEMBOLISM 06/28/2009   INTERMITTENT CLAUDICATION, BILATERAL 06/27/2009    Past Surgical History:  Procedure Laterality Date   AV FISTULA PLACEMENT  07-2009   Left Brachiocephalic AVF   CARDIAC CATHETERIZATION  2010   COLONOSCOPY  09/15/2012   Colonic polyp status post polypectomy. Mild sigmoid diverticulosis. Small internal hemorrhoids. Otherwise normal colonoscopy    ENDARTERECTOMY Right 08/20/2023   Procedure: ENDARTERECTOMY, CAROTID;  Surgeon: Lanis Fonda BRAVO, MD;  Location: [facility] OR;  Service: Vascular;  Laterality: Right;   LIGATION OF ARTERIOVENOUS  FISTULA Left 12/30/2013   Procedure: EXCISION OF LEFT ARM ARTERIOVENOUS  FISTULA;  Surgeon: Krystal JULIANNA Doing, MD;  Location: [facility] OR;  Service: Vascular;  Laterality: Left;   LITHOTRIPSY     PATCH ANGIOPLASTY Right 08/20/2023   Procedure: PATCH ANGIOPLASTY USING GEORGE BIOLOGIC PATCH;  Surgeon: Lanis Fonda BRAVO, MD;  Location: [facility] OR;  Service: Vascular;  Laterality: Right;   skin cancer removed     multiple basal cell   THORACIC AORTIC ENDOVASCULAR  STENT GRAFT N/A 11/14/2023   Procedure: INSERTION, ENDOVASCULAR STENT GRAFT, AORTA, THORACIC;  Surgeon: Magda Debby SAILOR, MD;  Location: [facility] OR;  Service: Vascular;  Laterality: N/A;       Home Medications    Prior to Admission medications   Medication Sig Start Date End Date Taking? Authorizing Provider  famotidine  (PEPCID ) 40 MG tablet Take 1 tablet (40 mg total) by mouth at bedtime. 11/29/23  Yes Craig Alan SAUNDERS, PA-C  isosorbide  mononitrate (IMDUR ) 30 MG 24 hr tablet Take 1 tablet (30 mg total) by mouth daily. 01/17/23  Yes Verlin Lonni BIRCH, MD  metoprolol  tartrate (LOPRESSOR ) 50 MG tablet Take 1 tablet (50 mg total) by mouth 2 (two) times daily.  11/20/23  Yes Danton Reyes DASEN, MD  pantoprazole  (PROTONIX ) 40 MG tablet Take 1 tablet (40 mg total) by mouth 2 (two) times daily. 11/20/23  Yes Danton Reyes DASEN, MD  Rivaroxaban  (XARELTO ) 15 MG TABS tablet Take 1 tablet (15 mg total) by mouth daily. 11/21/23  Yes Danton Reyes DASEN, MD  tamsulosin  (FLOMAX ) 0.4 MG CAPS capsule Take 1 capsule (0.4 mg total) by mouth daily. 11/21/23  Yes Danton Reyes DASEN, MD  acetaminophen  (TYLENOL ) 500 MG tablet Take 500 mg by mouth every 6 (six) hours as needed for mild pain (pain score 1-3), moderate pain (pain score 4-6) or headache.    [provider]  amLODipine  (NORVASC ) 10 MG tablet Take 1 tablet (10 mg total) by mouth every evening. 11/20/23   Danton Reyes DASEN, MD  aspirin  EC 81 MG tablet Take 81 mg by mouth every evening.    [provider]  betamethasone  dipropionate 0.05 % lotion Apply 1 application  topically 2 (two) times daily as needed (Psoriasis).    [provider]  Cholecalciferol (VITAMIN D-3 PO) Take 1 capsule by mouth daily.    [provider]  Coenzyme Q10 (COQ10 PO) Take 1 capsule by mouth every evening.    [provider]  Evolocumab  (REPATHA  SURECLICK) 140 MG/ML SOAJ Inject 140 mg into the skin every 14 (fourteen) days. Patient taking differently: Inject 140 mg into the skin See admin instructions. Inject 1mL (140mg ) into the skin every other Saturday. 06/21/23     Evolocumab  (REPATHA  SURECLICK) 140 MG/ML SOAJ Inject 140 mg into the skin every 14 (fourteen) days.    [provider]  flecainide  (TAMBOCOR ) 50 MG tablet Take 1 (one)  Tablet by mouth two times daily 08/23/23     hyoscyamine  (LEVSIN  SL) 0.125 MG SL tablet Place 1 tablet (0.125 mg total) under the tongue every 6 (six) hours as needed for cramping (nausea, diarrhea). 11/29/23   Craig Alan SAUNDERS, PA-C  lactulose  (CHRONULAC ) 10 GM/15ML solution Take 15-45 mLs (10-30 g total) by mouth 3 (three) times daily as needed for mild constipation.  11/23/23   Danton Reyes DASEN, MD  loratadine (CLARITIN) 10 MG tablet Take 10 mg by mouth daily.    [provider]  nitroGLYCERIN  (NITROSTAT ) 0.4 MG SL tablet Place 1 tablet (0.4 mg total) under the tongue every 5 (five) minutes as needed for chest pain. 01/17/23 11/29/23  Verlin Lonni BIRCH, MD  Omega-3 Fatty Acids (FISH OIL PO) Take 1 capsule by mouth daily.    [provider]  traMADol  (ULTRAM ) 50 MG tablet Take 1 tablet (50 mg total) by mouth every 6 (six) hours as needed. 11/26/23 11/25/24  Schuh, McKenzi P, PA-C  Triamcinolone  Acetonide (KENALOG  IJ) Inject 1 Dose into the muscle every 3 (three) months.  [provider]  triamcinolone  cream (KENALOG ) 0.1 % Apply topically 2 (two) times daily. Patient taking differently: Apply 1 Application topically 2 (two) times daily as needed (skin irritation, itching). 10/22/22       Family History Family History  Problem Relation Age of Onset   Cancer Mother    Heart disease Father        Before age 64   Asthma Father    Heart disease Brother        After 88 years of age   Coronary artery disease Other        family hx   Colon cancer Neg Hx    Esophageal cancer Neg Hx    Liver cancer Neg Hx    Pancreatic cancer Neg Hx    Rectal cancer Neg Hx    Stomach cancer Neg Hx     Social History Social History   Tobacco Use   Smoking status: Former    Current packs/day: 0.00    Types: Cigarettes    Quit date: 05/21/1970    Years since quitting: 53.5   Smokeless tobacco: Never   Tobacco comments:    quit 40+ years ago   Vaping Use   Vaping status: Never Used  Substance Use Topics   Alcohol use: No   Drug use: No     Allergies   Roxicodone  [oxycodone ], Pravachol  [pravastatin ], Sulfa antibiotics, Zohydro er [hydrocodone bitartrate er], Eliquis  [apixaban ], Hydrochlorothiazide, Hytrin [terazosin hcl], Pradaxa  [dabigatran  etexilate mesylate], and Xarelto  [rivaroxaban ]   Review of Systems Review of Systems See  HPI  Physical Exam Triage Vital Signs ED Triage Vitals  Encounter Vitals Group     BP 12/11/23 1256 131/69     Girls Systolic BP Percentile --      Girls Diastolic BP Percentile --      Boys Systolic BP Percentile --      Boys Diastolic BP Percentile --      Pulse Rate 12/11/23 1256 75     Resp 12/11/23 1256 18     Temp 12/11/23 1256 97.6 F (36.4 C)     Temp Source 12/11/23 1256 Oral     SpO2 12/11/23 1256 97 %     Weight --      Height --      Head Circumference --      Peak Flow --      Pain Score 12/11/23 1255 4     Pain Loc --      Pain Education --      Exclude from Growth Chart --    No data found.  Updated Vital Signs BP 131/69 (BP Location: Right Arm)   Pulse 75   Temp 97.6 F (36.4 C) (Oral)   Resp 18   SpO2 97%   Visual Acuity Right Eye Distance:   Left Eye Distance:   Bilateral Distance:    Right Eye Near:   Left Eye Near:    Bilateral Near:     Physical Exam Constitutional:      Appearance: Normal appearance.  Pulmonary:     Effort: Pulmonary effort is normal.  Musculoskeletal:        General: Swelling and tenderness present. Normal range of motion.     Comments: Bruising, swelling and tenderness to right foot at the 3rd, 4th and 5th metatarsals. 2+ pedal pulse  Skin:    Findings: Bruising present.  Neurological:     Mental Status: He is alert.  Psychiatric:  Mood and Affect: Mood normal.      UC Treatments / Results  Labs (all labs ordered are listed, but only abnormal results are displayed) Labs Reviewed - No data to display  EKG   Radiology DG Foot Complete Right Result Date: 12/11/2023 CLINICAL DATA:  Right foot pain after fall yesterday. EXAM: RIGHT FOOT COMPLETE - 3+ VIEW COMPARISON:  None Available. FINDINGS: There is no evidence of fracture or dislocation. There is no evidence of arthropathy or other focal bone abnormality. Soft tissues are unremarkable. IMPRESSION: Negative. Electronically Signed   By: Lynwood Landy Raddle M.D.   On: 12/11/2023 13:47    Procedures Procedures (including critical [name] time)  Medications Ordered in UC Medications - No data to display  Initial Impression / Assessment and Plan / UC Course  I have reviewed the triage vital signs and the nursing notes.  Pertinent labs & imaging results that were available during my [name] of the patient were reviewed by me and considered in my medical decision making (see chart for details).     Right foot injury-x-ray without any acute fracture or dislocation.  Most likely bad bruise.  Recommend rest, ice, elevate. Reassured this should resolve with time.  Follow-up as needed Final Clinical Impressions(s) / UC Diagnoses   Final diagnoses:  Injury of right foot, initial encounter     Discharge Instructions      Your x-ray did not show any fractures.  This is just a bad bruise.  Recommend rest, ice and elevate the foot is much as possible Will heal with time.  Follow-up as needed    ED Prescriptions   None    PDMP not reviewed this encounter.   Adah Wilbert LABOR, FNP 12/11/23 1806

## 2023-12-11 NOTE — Discharge Instructions (Signed)
 Your x-ray did not show any fractures.  This is just a bad bruise.  Recommend rest, ice and elevate the foot is much as possible Will heal with time.  Follow-up as needed

## 2023-12-11 NOTE — Telephone Encounter (Signed)
 Pt wife [name] made aware of  Alan Coombs PA recommendations: [name] verbalized understanding with all questions answered.

## 2023-12-11 NOTE — ED Triage Notes (Signed)
 Pt reports he was moving too fast and the next thing he knew he was falling face first he caught his self with the palm of his hands and his toes it happened about noon yesterday. Pt states he think he needs a xray of his right foot. Pt has a knot at the base of his toes on right foot and bruising noted.

## 2023-12-13 ENCOUNTER — Telehealth: Payer: Self-pay

## 2023-12-13 ENCOUNTER — Other Ambulatory Visit (HOSPITAL_COMMUNITY): Payer: Self-pay

## 2023-12-13 ENCOUNTER — Other Ambulatory Visit: Payer: Self-pay

## 2023-12-13 ENCOUNTER — Other Ambulatory Visit (HOSPITAL_BASED_OUTPATIENT_CLINIC_OR_DEPARTMENT_OTHER): Payer: Self-pay

## 2023-12-13 NOTE — Telephone Encounter (Addendum)
 Called pt back to discuss. Pt has decided to keep the 01/02/24 appointments to get Carotid US  and meet with Dr. Lanis.  At that time, the pt will discuss carotids and the path forward re: the CT.    Received another communication from Dr. Lanis requesting another non-contrast CT.   Lanis Fonda BRAVO, MD  Addie Lolita DEL, RN I guess. I don't have anything to tell him at this point. If we can't follow with a contrasted CT it will be another noncon at the one-month mark       Previous Messages    ----- Message ----- From: Addie Lolita DEL, RN Sent: 12/13/2023   8:41 AM EDT To: Fonda BRAVO Lanis, MD Subject: RE: CT Scan                                    Do you still want another noncontrasted CT since he had one on 6/26 and 7/15?     Pt's wife called concerned about pt's upcoming CTA c/a/p with contrast in light of his poor kidney function. Consulted Dr. Lanis (see below conversation) Patient had CT c/a/p on 11/14/23 and 12/03/23 so will not repeat another CT w/o contrast Called patient back to inform him that CTA w/contrast will be cancelled and he can follow up with Dr. Lanis as scheduled.  Consulted Dr. Lanis:  Lanis Fonda BRAVO, MD  Addie Lolita DEL, RN I think this point we can hold on the CT scan until his kidneys recover with plans to repeat the noncontrasted CT prior to his next visit.  We can go ahead and schedule him to be seen earlier  if they are worried       Previous Messages    ----- Message ----- From: Addie Lolita DEL, RN Sent: 12/12/2023   2:10 PM EDT To: Fonda BRAVO Lanis, MD Subject: RE: CT Scan                                    Wife called today concerned about pt's kidney function.  Had labs at PCP today and kidney function is down.  Worried about getting the contrast.  Also c/o right kidney pain . ----- Message ----- From: Lanis Fonda BRAVO, MD Sent: 12/07/2023   8:06 AM EDT To: Lolita DEL Addie, RN Subject: RE: CT Scan                                    Yes pt  needs CTA c/a/p to better define what is going on. I can't see anything in the noncon imaging.  He should go ahead and get it and needs follow up with me immediately after.  Fonda BRAVO Lanis MD ----- Message ----- From: Addie Lolita DEL, RN Sent: 12/06/2023   2:51 PM EDT To: Fonda BRAVO Lanis, MD Subject: CT Scan                                        Patient wanting know if he still needs to keep the CTA c/a/p scheduled for 8/7 since he had a CT yesterday.  There were some new findings and the wife is concerned.  Thanks! Lolita

## 2023-12-16 ENCOUNTER — Other Ambulatory Visit: Payer: Self-pay

## 2023-12-16 DIAGNOSIS — I6521 Occlusion and stenosis of right carotid artery: Secondary | ICD-10-CM

## 2023-12-19 NOTE — Assessment & Plan Note (Signed)
 S/p right CEA.  Follow-up with vascular surgery as planned.

## 2023-12-19 NOTE — Progress Notes (Unsigned)
 OFFICE NOTE:    Date:  12/20/2023  ID:  MACKLIN JACQUIN, DOB Sep 29, 1945, MRN [id number] PCP: Erick Greig LABOR, NP  [facility] Providers Cardiologist:  Lonni Cash, MD       Coronary artery disease  LHC in 2010 complicated by atheroemboli and worsening renal function LHC: LAD proximal 30, mid 40; RCA mid 50-70, distal 50-70, EF 65 TTE 01/26/2020 [name]): EF 55-60, trace AI, MR Med Rx favored given prior history of atheroembolization post cath and CKD MPI 10/29/2022: Poss very mild ischemia apical lateral wall vs diaph attenuation, EF 64, low risk TTE 07/04/2023: EF 55-60, no RWMA, mild concentric LVH, normal RVSF, normal PASP, aortic root 40 mm Limited TTE 11/11/2023: EF 60-65, no RWMA, normal RVSF Paroxysmal atrial fibrillation Intolerant of Eliquis , Pradaxa , Xarelto  (rash) Declined Coumadin therapy Flecainide  Rx; intolerant of higher doses Carotid artery disease S/p R CEA 08/2023 Dilated aortic root Descending thoracic aortic aneurysm s/p acute dissection/intramural hematoma 10/2023 S/p TEVAR Dr. Silver  Chronic kidney disease Hypertension Renal artery US  11/13/2023: No L RA stenosis Hyperlipidemia Intolerant of statins Psoriatic arthritis        Discussed the use of AI scribe software for clinical note transcription with the patient, who gave verbal consent to proceed. History of Present Illness Jack [name] is a 78 y.o. male   Last seen by Dr. Cash 08/2023.  He declined Coumadin therapy at that time and was maintained on aspirin  and flecainide .  Admitted 6/20-7/2 with descending thoracic aortic aneurysm with dissection/intramural hematoma s/p TEVAR.  Blood pressure was 180/120 on presentation.  Hospitalization was complicated by atrial fibrillation with rapid ventricular rate.  He was initially placed on amiodarone  and transitioned back to home flecainide  dose.  He was placed on Xarelto  for anticoagulation.  Troponin was elevated and felt to be related to  demand ischemia.  He was in rate controlled atrial fibrillation at discharge.  He has not experienced any symptoms of atrial fibrillation since returning home. He is taking Xarelto  for anticoagulation. He experiences severe pain in his stomach and low back since being discharged from the hospital. The pain is sharp, lasting from 30 seconds to a couple of minutes, and occurs without any specific trigger. He has been given tramadol  for pain management. He saw GI and had an abd/pelvic CT. He reports a dry cough. No fever, sore throat, or congestion. He recently injured his R foot, which is swollen and tender. An x-ray at urgent care showed no fracture.     Review of Systems  Gastrointestinal:  Negative for hematochezia and melena.  -See HPI     Studies Reviewed:  EKG Interpretation Date/Time:  Friday December 20 2023 10:09:33 EDT Ventricular Rate:  56 PR Interval:  176 QRS Duration:  90 QT Interval:  418 QTC Calculation: 403 R Axis:   3  Text Interpretation: Sinus bradycardia Confirmed by Lelon Hamilton [phone]) on 12/20/2023 10:17:43 AM    Risk Assessment/Calculations: CHA2DS2-VASc Score = 4   This indicates a 4.8% annual risk of stroke. The patient's score is based upon: CHF History: 0 HTN History: 1 Diabetes History: 0 Stroke History: 0 Vascular Disease History: 1 Age Score: 2 Gender Score: 0           Physical Exam:  VS:  BP 114/72   Pulse (!) 56   Ht 5' 10 (1.778 m)   Wt 154 lb 12.8 oz (70.2 kg)   SpO2 98%   BMI 22.21 kg/m  Wt Readings from Last 3 Encounters:  12/20/23 154 lb 12.8 oz (70.2 kg)  11/29/23 153 lb (69.4 kg)  11/20/23 149 lb 8 oz (67.8 kg)    Constitutional:      Appearance: Healthy appearance. Not in distress.  Neck:     Vascular: JVD normal.  Pulmonary:     Breath sounds: Normal breath sounds. No wheezing. No rales.  Cardiovascular:     Normal rate. Regular rhythm.     Murmurs: There is no murmur.  Edema:    Peripheral edema present.     Ankle: 1+ edema of the left ankle. Abdominal:     Palpations: Abdomen is soft.       Assessment and Plan:    Assessment & Plan Persistent atrial fibrillation (HCC) He has a history of intolerance to DOAC's secondary to rash.  However, during recent admission with descending thoracic aortic aneurysm and aortic dissection, he agreed to start DOAC therapy.  He was placed on rivaroxaban .  He was briefly treated with amiodarone  and was transitioned back to his home dose of flecainide .  Discharge notes indicated he was in rate controlled A-fib.  EKG today demonstrates he has returned to normal sinus rhythm.  He is tolerating anticoagulation well.  He does note a nonproductive cough.  I do not think this is related to Xarelto . -Continue flecainide  50 mg twice daily, metoprolol  tartrate 50 mg twice daily, Rivaroxaban  15 mg daily (creatinine clearance 36). -Follow-up 6 months Dissecting aneurysm of descending thoracic aorta (HCC) S/p TEVAR.  He continues to have episodes of lower back discomfort since his surgery.  This can be quite intense at times.  He had a CT scan with GI last month.  This did show some thickening around his stent which could be intramural hematoma.  He has follow-up with Dr. Lanis on the 14th.  I will update Dr. Lanis on the patient's symptoms.  I have asked him to follow-up as planned or return sooner if symptoms should worsen.  From cardiac standpoint, he does not need aspirin .  I have asked him to remain on aspirin  until he sees Dr. Lanis. Coronary artery disease involving native coronary artery of native heart without angina pectoris History of cardiac catheterization in 2010 demonstrated nonobstructive disease.  Procedure was complicated by atheroemboli resulting in acute kidney injury.  MPI in 2024 with possible mild ischemia in apical lateral wall versus diaphragmatic attenuation.  He has been hesitant to proceed with repeat cardiac catheterization.  Chest pain has been managed  medically.  Recent admission with descending thoracic aortic aneurysm and aortic dissection complicated by A-fib with RVR.  He had elevated troponins thought to be due to demand ischemia.  He is currently doing well without chest discomfort to suggest angina.  As noted, he is on Xarelto  and does not need aspirin  from a cardiac standpoint.  However, he may need to remain on aspirin  in the postoperative period after TEVAR.  I have asked her to discuss this with Dr. Lanis at his follow-up visit. -Continue amlodipine  10 mg daily, Repatha  140 mg every 2 weeks, Imdur  30 mg daily, metoprolol  tartrate 50 mg twice daily, nitroglycerin  as needed -Follow-up 6 months Stenosis of right carotid artery S/p right CEA.  Follow-up with vascular surgery as planned. Essential hypertension Blood pressure controlled.  Continue amlodipine  10 mg daily, Imdur  30 mg daily, metoprolol  tartrate 50 mg twice daily Pure hypercholesterolemia He is intolerant of statins.  Continue Repatha  140 mg every 2 weeks  Dispo:  Return in about 6 months (around 06/21/2024) for Routine Follow Up, w/ Dr. Verlin.  Signed, Glendia Ferrier, PA-C

## 2023-12-19 NOTE — Assessment & Plan Note (Signed)
 He has a history of intolerance to DOAC's secondary to rash.  However, during recent admission with descending thoracic aortic aneurysm and aortic dissection, he agreed to start DOAC therapy.  He was placed on rivaroxaban .  He was briefly treated with amiodarone  was transition back to his home dose of flecainide .  Discharge notes indicated he was in rate controlled A-fib.

## 2023-12-19 NOTE — Assessment & Plan Note (Signed)
 History of cardiac catheterization in 2010 demonstrated nonobstructive disease.  Procedure was complicated by atheroemboli resulting in acute kidney injury.  MPI in 2024 with possible mild ischemia in apical lateral wall versus diaphragmatic attenuation.  He has been hesitant to proceed with repeat cardiac catheterization.  Chest pain has been managed medically.  Recent admission with descending thoracic aortic aneurysm and aortic dissection complicated by A-fib with RVR.  He had elevated troponins thought to be due to demand ischemia.

## 2023-12-19 NOTE — Assessment & Plan Note (Signed)
 S/p TEVAR.  He continues to have episodes of lower back discomfort since his surgery.  This can be quite intense at times.  He had a CT scan with GI last month.  This did show some thickening around his stent which could be intramural hematoma.  He has follow-up with Dr. Lanis on the 14th.  I will update Dr. Lanis on the patient's symptoms.  I have asked him to follow-up as planned or return sooner if symptoms should worsen.  From cardiac standpoint, he does not need aspirin .  I have asked him to remain on aspirin  until he sees Dr. Lanis.

## 2023-12-20 ENCOUNTER — Encounter: Payer: Self-pay | Admitting: Physician Assistant

## 2023-12-20 ENCOUNTER — Other Ambulatory Visit (HOSPITAL_BASED_OUTPATIENT_CLINIC_OR_DEPARTMENT_OTHER): Payer: Self-pay

## 2023-12-20 ENCOUNTER — Ambulatory Visit: Attending: Physician Assistant | Admitting: Physician Assistant

## 2023-12-20 VITALS — BP 114/72 | HR 56 | Ht 70.0 in | Wt 154.8 lb

## 2023-12-20 DIAGNOSIS — I71012 Dissection of descending thoracic aorta: Secondary | ICD-10-CM

## 2023-12-20 DIAGNOSIS — I4819 Other persistent atrial fibrillation: Secondary | ICD-10-CM | POA: Diagnosis not present

## 2023-12-20 DIAGNOSIS — I6521 Occlusion and stenosis of right carotid artery: Secondary | ICD-10-CM | POA: Diagnosis not present

## 2023-12-20 DIAGNOSIS — I2510 Atherosclerotic heart disease of native coronary artery without angina pectoris: Secondary | ICD-10-CM

## 2023-12-20 DIAGNOSIS — E7800 Pure hypercholesterolemia, unspecified: Secondary | ICD-10-CM

## 2023-12-20 DIAGNOSIS — I10 Essential (primary) hypertension: Secondary | ICD-10-CM

## 2023-12-20 MED ORDER — RIVAROXABAN 15 MG PO TABS
15.0000 mg | ORAL_TABLET | Freq: Every day | ORAL | 11 refills | Status: DC
  Filled 2023-12-20 – 2024-01-09 (×2): qty 30, 30d supply, fill #0
  Filled 2024-02-10: qty 30, 30d supply, fill #1
  Filled 2024-03-12: qty 30, 30d supply, fill #2
  Filled 2024-03-12: qty 90, 90d supply, fill #2
  Filled 2024-04-09: qty 30, 30d supply, fill #3

## 2023-12-20 NOTE — Assessment & Plan Note (Signed)
 Blood pressure controlled.  Continue amlodipine  10 mg daily, Imdur  30 mg daily, metoprolol  tartrate 50 mg twice daily

## 2023-12-20 NOTE — Patient Instructions (Signed)
 Medication Instructions:  Your physician recommends that you continue on your current medications as directed. Please refer to the Current Medication list given to you today.  *If you need a refill on your cardiac medications before your next appointment, please call your pharmacy*  Lab Work: NONE If you have labs (blood work) drawn today and your tests are completely normal, you will receive your results only by: MyChart Message (if you have MyChart) OR A paper copy in the mail If you have any lab test that is abnormal or we need to change your treatment, we will call you to review the results.  Testing/Procedures: NONE  Follow-Up: At [facility], you and your health needs are our priority.  As part of our continuing mission to provide you with exceptional heart care, our providers are all part of one team.  This team includes your primary Cardiologist (physician) and Advanced Practice Providers or APPs (Physician Assistants and Nurse Practitioners) who all work together to provide you with the care you need, when you need it.  Your next appointment:   6 month(s)  Provider:   Lonni Cash, MD   We recommend signing up for the patient portal called MyChart.  Sign up information is provided on this After Visit Summary.  MyChart is used to connect with patients for Virtual Visits (Telemedicine).  Patients are able to view lab/test results, encounter notes, upcoming appointments, etc.  Non-urgent messages can be sent to your provider as well.   To learn more about what you can do with MyChart, go to [email or web address].

## 2023-12-23 ENCOUNTER — Other Ambulatory Visit (HOSPITAL_BASED_OUTPATIENT_CLINIC_OR_DEPARTMENT_OTHER): Payer: Self-pay

## 2023-12-24 ENCOUNTER — Ambulatory Visit: Admitting: Vascular Surgery

## 2023-12-24 ENCOUNTER — Encounter (HOSPITAL_COMMUNITY)

## 2023-12-26 ENCOUNTER — Other Ambulatory Visit (HOSPITAL_BASED_OUTPATIENT_CLINIC_OR_DEPARTMENT_OTHER): Admitting: Radiology

## 2023-12-31 DIAGNOSIS — D539 Nutritional anemia, unspecified: Secondary | ICD-10-CM | POA: Diagnosis not present

## 2023-12-31 DIAGNOSIS — R972 Elevated prostate specific antigen [PSA]: Secondary | ICD-10-CM | POA: Diagnosis not present

## 2023-12-31 DIAGNOSIS — R7309 Other abnormal glucose: Secondary | ICD-10-CM | POA: Diagnosis not present

## 2023-12-31 DIAGNOSIS — E785 Hyperlipidemia, unspecified: Secondary | ICD-10-CM | POA: Diagnosis not present

## 2023-12-31 DIAGNOSIS — N184 Chronic kidney disease, stage 4 (severe): Secondary | ICD-10-CM | POA: Diagnosis not present

## 2023-12-31 DIAGNOSIS — I10 Essential (primary) hypertension: Secondary | ICD-10-CM | POA: Diagnosis not present

## 2024-01-02 ENCOUNTER — Ambulatory Visit (HOSPITAL_COMMUNITY)
Admission: RE | Admit: 2024-01-02 | Discharge: 2024-01-02 | Disposition: A | Discharge status: Home / Self Care | Source: Ambulatory Visit | Attending: Vascular Surgery | Admitting: Vascular Surgery

## 2024-01-02 ENCOUNTER — Encounter: Payer: Self-pay | Admitting: Vascular Surgery

## 2024-01-02 ENCOUNTER — Ambulatory Visit (INDEPENDENT_AMBULATORY_CARE_PROVIDER_SITE_OTHER): Admitting: Vascular Surgery

## 2024-01-02 VITALS — BP 156/81 | HR 56 | Temp 98.2°F | Resp 18 | Ht 70.0 in | Wt 155.7 lb

## 2024-01-02 DIAGNOSIS — I6521 Occlusion and stenosis of right carotid artery: Secondary | ICD-10-CM | POA: Diagnosis not present

## 2024-01-02 DIAGNOSIS — M7989 Other specified soft tissue disorders: Secondary | ICD-10-CM

## 2024-01-02 DIAGNOSIS — Z9889 Other specified postprocedural states: Secondary | ICD-10-CM | POA: Diagnosis not present

## 2024-01-02 DIAGNOSIS — S2500XS Unspecified injury of thoracic aorta, sequela: Secondary | ICD-10-CM | POA: Diagnosis not present

## 2024-01-02 DIAGNOSIS — I6522 Occlusion and stenosis of left carotid artery: Secondary | ICD-10-CM

## 2024-01-02 DIAGNOSIS — Z8679 Personal history of other diseases of the circulatory system: Secondary | ICD-10-CM

## 2024-01-02 NOTE — Progress Notes (Signed)
 POST OPERATIVE OFFICE NOTE    HPI:  Jack [name] is a 78 y.o. male who is here for postop visit.    Jack [name] is 1 on my service line having previously undergone right-sided carotid endarterectomy on 08/20/2023.  He recovered well, however was hospitalized a few months later with descending thoracic intramural hematoma.  He was initially started on anti-impulse control, however remained symptomatic with follow-up imaging demonstrate concern for contained rupture.  He was taken to surgery with one of my partners for TEVAR, which went well.  Overall, Jack [name] hospitalization was 12 days with a significant stent of that in the ICU.  On exam today, Jack [name] was doing well, accompanied by his wife.  His major complaint was lower extremity edema.  He feels like this is due to daily amlodipine, and worsening kidney function.  He states he continues to have waxing waning back pain, however has not had any pain for several days.  He states he is always cold.  Denies TIA, stroke, amaurosis.   Medications include Xarelto for A-fib, aspirin, high intensity statin-has not restarted Repatha.   Allergies  Allergen Reactions   Roxicodone [Oxycodone] Nausea And Vomiting   Pravachol [Pravastatin] Other (See Comments)    Myalgias    Sulfa Antibiotics Itching   Zohydro Er [Hydrocodone Bitartrate Er] Nausea Only   Eliquis [Apixaban] Itching and Rash   Hydrochlorothiazide Anxiety   Hytrin [Terazosin Hcl] Palpitations and Other (See Comments)    Heart races   Pradaxa [Dabigatran Etexilate Mesylate] Itching and Rash   Xarelto [Rivaroxaban] Itching and Rash    Current Outpatient Medications  Medication Sig Dispense Refill   acetaminophen (TYLENOL) 500 MG tablet Take 500 mg by mouth every 6 (six) hours as needed for mild pain (pain score 1-3), moderate pain (pain score 4-6) or headache.     amLODipine (NORVASC) 10 MG tablet Take 1 tablet (10 mg total) by mouth every evening. 30 tablet 2   aspirin EC 81 MG tablet Take 81  mg by mouth every evening.     betamethasone dipropionate 0.05 % lotion Apply 1 application  topically 2 (two) times daily as needed (Psoriasis).     Cholecalciferol (VITAMIN D-3 PO) Take 1 capsule by mouth daily.     Coenzyme Q10 (COQ10 PO) Take 1 capsule by mouth every evening.     Evolocumab (REPATHA SURECLICK) 140 MG/ML SOAJ Inject 140 mg into the skin every 14 (fourteen) days. (Patient taking differently: Inject 140 mg into the skin See admin instructions. Inject 1mL (140mg ) into the skin every other Saturday.) 6 mL 3   Evolocumab (REPATHA SURECLICK) 140 MG/ML SOAJ Inject 140 mg into the skin every 14 (fourteen) days.     famotidine (PEPCID) 40 MG tablet Take 1 tablet (40 mg total) by mouth at bedtime. 90 tablet 2   flecainide (TAMBOCOR) 50 MG tablet Take 1 (one)  Tablet by mouth two times daily 180 tablet 1   isosorbide mononitrate (IMDUR) 30 MG 24 hr tablet Take 1 tablet (30 mg total) by mouth daily. 90 tablet 3   loratadine (CLARITIN) 10 MG tablet Take 10 mg by mouth daily.     metoprolol tartrate (LOPRESSOR) 50 MG tablet Take 1 tablet (50 mg total) by mouth 2 (two) times daily. 60 tablet 2   nitroGLYCERIN (NITROSTAT) 0.4 MG SL tablet Place 1 tablet (0.4 mg total) under the tongue every 5 (five) minutes as needed for chest pain. 25 tablet 3   Omega-3 Fatty Acids (FISH OIL PO) Take 1  capsule by mouth daily.     pantoprazole (PROTONIX) 40 MG tablet Take 1 tablet (40 mg total) by mouth 2 (two) times daily. 60 tablet 0   Rivaroxaban (XARELTO) 15 MG TABS tablet Take 1 tablet (15 mg total) by mouth daily. 30 tablet 11   tamsulosin (FLOMAX) 0.4 MG CAPS capsule Take 1 capsule (0.4 mg total) by mouth daily. 30 capsule 2   traMADol (ULTRAM) 50 MG tablet Take 1 tablet (50 mg total) by mouth every 6 (six) hours as needed. 12 tablet 0   Triamcinolone Acetonide (KENALOG IJ) Inject 1 Dose into the muscle every 3 (three) months.     triamcinolone cream (KENALOG) 0.1 % Apply topically 2 (two) times daily.  (Patient taking differently: Apply 1 Application topically 2 (two) times daily as needed (skin irritation, itching).) 454 g 1   No current facility-administered medications for this visit.     ROS:  See HPI  Physical Exam:  Incision: Right sided neck incision healing appropriately without hematoma, drainage, or dehiscence Extremities: Palpable radial pulses bilaterally.  Moving all extremities equally Neuro: No slurred speech, lip droop, or tongue deviation  Right Carotid Findings:  +----------+--------+--------+--------+------------------+--------+           PSV cm/sEDV cm/sStenosisPlaque DescriptionComments  +----------+--------+--------+--------+------------------+--------+  CCA Prox  64      7                                           +----------+--------+--------+--------+------------------+--------+  CCA Mid   58      9               heterogenous                +----------+--------+--------+--------+------------------+--------+  CCA Distal65      9                                           +----------+--------+--------+--------+------------------+--------+  ICA Prox  50      4       Normal                              +----------+--------+--------+--------+------------------+--------+  ICA Mid   64      13                                          +----------+--------+--------+--------+------------------+--------+  ICA Distal58      12                                          +----------+--------+--------+--------+------------------+--------+  ECA      85      9                                           +----------+--------+--------+--------+------------------+--------+   +----------+--------+-------+----------------+-------------------+           PSV cm/sEDV cmsDescribe        Arm  Pressure (mmHG)  +----------+--------+-------+----------------+-------------------+  Subclavian117    0      Multiphasic, WNL                      +----------+--------+-------+----------------+-------------------+   +---------+--------+--+--------+-+---------+  VertebralPSV cm/s42EDV cm/s6Antegrade  +---------+--------+--+--------+-+---------+      Left Carotid Findings:  +----------+--------+--------+--------+------------------+--------+           PSV cm/sEDV cm/sStenosisPlaque DescriptionComments  +----------+--------+--------+--------+------------------+--------+  CCA Prox  116     7                                           +----------+--------+--------+--------+------------------+--------+  CCA Mid   76      9                                           +----------+--------+--------+--------+------------------+--------+  CCA Distal66      11                                          +----------+--------+--------+--------+------------------+--------+  ICA Prox  106     21      1-39%   heterogenous                +----------+--------+--------+--------+------------------+--------+  ICA Mid   113     15                                          +----------+--------+--------+--------+------------------+--------+  ICA Distal70      12                                          +----------+--------+--------+--------+------------------+--------+  ECA      115     9               heterogenous                +----------+--------+--------+--------+------------------+--------+   +----------+--------+--------+----------------+-------------------+           PSV cm/sEDV cm/sDescribe        Arm Pressure (mmHG)  +----------+--------+--------+----------------+-------------------+  Subclavian122    0       Multiphasic, WNL                     +----------+--------+--------+----------------+-------------------+   +---------+--------+--+--------+-+---------+  VertebralPSV cm/s30EDV cm/s7Antegrade  +---------+--------+--+--------+-+---------+          Summary:  Right Carotid: Patent right ICA endarterectomy with no stenosis.   Left Carotid: Velocities in the left ICA are consistent with a 1-39%  stenosis.   Vertebrals: Bilateral vertebral arteries demonstrate antegrade flow.  Subclavians: Normal flow hemodynamics were seen in bilateral subclavian               arteries.   Assessment/Plan:  This is a 78 y.o. male here for follow-up.   Elective right-sided carotid endarterectomy-ultrasound demonstrated no residual stenosis.  Widely patent. Plan to follow this on a yearly basis with repeat carotid duplex ultrasound.  Mild disease  on the left.  Regarding his TEVAR, CT Noncon from July was reviewed.  There appears to be aortic intramural hematoma surrounding the graft.  Back pain is improving.  No constitutional symptoms concerning for infection.  Patient has palpable pulses in the feet.  There is a small amount of mottling, however no concern for blue toe syndrome.  I think changing his antihypertensive medication and discontinuing amlodipine would significantly improve lower extremity edema. He complained of a chronic cough as well.  This appeared to be most prominent with deep breaths.  We discussed the importance of using a spirometer.  I think the Kenalog shot next week will also help.  Overall I am happy with how he is doing, and want to continue to monitor the thoracic aorta.  My plan is to see him again in 3 months with noncontrasted scan of the chest.

## 2024-01-06 DIAGNOSIS — D539 Nutritional anemia, unspecified: Secondary | ICD-10-CM | POA: Diagnosis not present

## 2024-01-06 DIAGNOSIS — E559 Vitamin D deficiency, unspecified: Secondary | ICD-10-CM | POA: Diagnosis not present

## 2024-01-06 DIAGNOSIS — I6523 Occlusion and stenosis of bilateral carotid arteries: Secondary | ICD-10-CM | POA: Diagnosis not present

## 2024-01-06 DIAGNOSIS — E785 Hyperlipidemia, unspecified: Secondary | ICD-10-CM | POA: Diagnosis not present

## 2024-01-06 DIAGNOSIS — I2510 Atherosclerotic heart disease of native coronary artery without angina pectoris: Secondary | ICD-10-CM | POA: Diagnosis not present

## 2024-01-06 DIAGNOSIS — H539 Unspecified visual disturbance: Secondary | ICD-10-CM | POA: Diagnosis not present

## 2024-01-06 DIAGNOSIS — N184 Chronic kidney disease, stage 4 (severe): Secondary | ICD-10-CM | POA: Diagnosis not present

## 2024-01-06 DIAGNOSIS — I10 Essential (primary) hypertension: Secondary | ICD-10-CM | POA: Diagnosis not present

## 2024-01-06 DIAGNOSIS — R7309 Other abnormal glucose: Secondary | ICD-10-CM | POA: Diagnosis not present

## 2024-01-06 DIAGNOSIS — J309 Allergic rhinitis, unspecified: Secondary | ICD-10-CM | POA: Diagnosis not present

## 2024-01-06 DIAGNOSIS — D692 Other nonthrombocytopenic purpura: Secondary | ICD-10-CM | POA: Diagnosis not present

## 2024-01-06 DIAGNOSIS — R972 Elevated prostate specific antigen [PSA]: Secondary | ICD-10-CM | POA: Diagnosis not present

## 2024-01-07 ENCOUNTER — Other Ambulatory Visit (HOSPITAL_BASED_OUTPATIENT_CLINIC_OR_DEPARTMENT_OTHER): Payer: Self-pay

## 2024-01-07 DIAGNOSIS — I129 Hypertensive chronic kidney disease with stage 1 through stage 4 chronic kidney disease, or unspecified chronic kidney disease: Secondary | ICD-10-CM | POA: Diagnosis not present

## 2024-01-07 DIAGNOSIS — N2581 Secondary hyperparathyroidism of renal origin: Secondary | ICD-10-CM | POA: Diagnosis not present

## 2024-01-07 DIAGNOSIS — N179 Acute kidney failure, unspecified: Secondary | ICD-10-CM | POA: Diagnosis not present

## 2024-01-07 DIAGNOSIS — N189 Chronic kidney disease, unspecified: Secondary | ICD-10-CM | POA: Diagnosis not present

## 2024-01-07 MED ORDER — FUROSEMIDE 20 MG PO TABS
20.0000 mg | ORAL_TABLET | Freq: Every day | ORAL | 0 refills | Status: DC
  Filled 2024-01-07: qty 30, 30d supply, fill #0

## 2024-01-08 DIAGNOSIS — R972 Elevated prostate specific antigen [PSA]: Secondary | ICD-10-CM | POA: Diagnosis not present

## 2024-01-08 DIAGNOSIS — N401 Enlarged prostate with lower urinary tract symptoms: Secondary | ICD-10-CM | POA: Diagnosis not present

## 2024-01-08 DIAGNOSIS — R3912 Poor urinary stream: Secondary | ICD-10-CM | POA: Diagnosis not present

## 2024-01-09 ENCOUNTER — Other Ambulatory Visit (HOSPITAL_BASED_OUTPATIENT_CLINIC_OR_DEPARTMENT_OTHER): Payer: Self-pay

## 2024-01-09 DIAGNOSIS — R059 Cough, unspecified: Secondary | ICD-10-CM | POA: Diagnosis not present

## 2024-01-09 DIAGNOSIS — I480 Paroxysmal atrial fibrillation: Secondary | ICD-10-CM | POA: Diagnosis not present

## 2024-01-09 DIAGNOSIS — I10 Essential (primary) hypertension: Secondary | ICD-10-CM | POA: Diagnosis not present

## 2024-01-09 MED ORDER — PROMETHAZINE-DM 6.25-15 MG/5ML PO SYRP
5.0000 mL | ORAL_SOLUTION | Freq: Four times a day (QID) | ORAL | 0 refills | Status: AC | PRN
  Filled 2024-01-09: qty 180, 5d supply, fill #0

## 2024-01-14 DIAGNOSIS — N179 Acute kidney failure, unspecified: Secondary | ICD-10-CM | POA: Diagnosis not present

## 2024-01-15 ENCOUNTER — Other Ambulatory Visit (HOSPITAL_BASED_OUTPATIENT_CLINIC_OR_DEPARTMENT_OTHER): Payer: Self-pay

## 2024-01-15 MED ORDER — DOXAZOSIN MESYLATE 2 MG PO TABS
2.0000 mg | ORAL_TABLET | Freq: Every evening | ORAL | 6 refills | Status: DC
  Filled 2024-01-15: qty 30, 30d supply, fill #0

## 2024-01-16 ENCOUNTER — Other Ambulatory Visit (HOSPITAL_BASED_OUTPATIENT_CLINIC_OR_DEPARTMENT_OTHER): Payer: Self-pay

## 2024-01-16 MED ORDER — HYDRALAZINE HCL 25 MG PO TABS
25.0000 mg | ORAL_TABLET | Freq: Two times a day (BID) | ORAL | 2 refills | Status: AC
  Filled 2024-01-16: qty 60, 30d supply, fill #0

## 2024-01-17 ENCOUNTER — Other Ambulatory Visit (HOSPITAL_BASED_OUTPATIENT_CLINIC_OR_DEPARTMENT_OTHER): Payer: Self-pay

## 2024-01-21 DIAGNOSIS — R600 Localized edema: Secondary | ICD-10-CM | POA: Diagnosis not present

## 2024-01-21 DIAGNOSIS — I10 Essential (primary) hypertension: Secondary | ICD-10-CM | POA: Diagnosis not present

## 2024-01-21 DIAGNOSIS — Z6821 Body mass index (BMI) 21.0-21.9, adult: Secondary | ICD-10-CM | POA: Diagnosis not present

## 2024-01-21 DIAGNOSIS — N184 Chronic kidney disease, stage 4 (severe): Secondary | ICD-10-CM | POA: Diagnosis not present

## 2024-01-23 ENCOUNTER — Other Ambulatory Visit (HOSPITAL_BASED_OUTPATIENT_CLINIC_OR_DEPARTMENT_OTHER): Payer: Self-pay

## 2024-01-23 MED ORDER — AMMONIUM LACTATE 12 % EX LOTN
1.0000 | TOPICAL_LOTION | Freq: Two times a day (BID) | CUTANEOUS | 3 refills | Status: AC
  Filled 2024-01-23: qty 400, 100d supply, fill #0

## 2024-01-23 MED ORDER — BETAMETHASONE DIPROPIONATE 0.05 % EX LOTN
1.0000 | TOPICAL_LOTION | Freq: Two times a day (BID) | CUTANEOUS | 1 refills | Status: AC | PRN
  Filled 2024-01-23: qty 60, 30d supply, fill #0

## 2024-01-23 MED ORDER — TRIAMCINOLONE ACETONIDE 0.1 % EX CREA
1.0000 | TOPICAL_CREAM | Freq: Two times a day (BID) | CUTANEOUS | 3 refills | Status: DC
  Filled 2024-01-23: qty 454, 30d supply, fill #0

## 2024-01-24 ENCOUNTER — Other Ambulatory Visit (HOSPITAL_BASED_OUTPATIENT_CLINIC_OR_DEPARTMENT_OTHER): Payer: Self-pay

## 2024-01-28 ENCOUNTER — Other Ambulatory Visit (HOSPITAL_BASED_OUTPATIENT_CLINIC_OR_DEPARTMENT_OTHER): Payer: Self-pay

## 2024-01-28 ENCOUNTER — Other Ambulatory Visit: Payer: Self-pay | Admitting: Cardiovascular Disease

## 2024-01-28 DIAGNOSIS — N179 Acute kidney failure, unspecified: Secondary | ICD-10-CM | POA: Diagnosis not present

## 2024-01-30 ENCOUNTER — Other Ambulatory Visit (HOSPITAL_BASED_OUTPATIENT_CLINIC_OR_DEPARTMENT_OTHER): Payer: Self-pay

## 2024-01-30 DIAGNOSIS — R600 Localized edema: Secondary | ICD-10-CM | POA: Diagnosis not present

## 2024-01-30 DIAGNOSIS — I10 Essential (primary) hypertension: Secondary | ICD-10-CM | POA: Diagnosis not present

## 2024-01-30 DIAGNOSIS — H0289 Other specified disorders of eyelid: Secondary | ICD-10-CM | POA: Diagnosis not present

## 2024-01-30 DIAGNOSIS — Z6821 Body mass index (BMI) 21.0-21.9, adult: Secondary | ICD-10-CM | POA: Diagnosis not present

## 2024-01-30 DIAGNOSIS — N184 Chronic kidney disease, stage 4 (severe): Secondary | ICD-10-CM | POA: Diagnosis not present

## 2024-01-30 MED ORDER — NEOMYCIN-POLYMYXIN-DEXAMETH 3.5-10000-0.1 OP SUSP
2.0000 [drp] | Freq: Two times a day (BID) | OPHTHALMIC | 0 refills | Status: AC
  Filled 2024-01-30: qty 10, 33d supply, fill #0

## 2024-02-04 ENCOUNTER — Other Ambulatory Visit (HOSPITAL_BASED_OUTPATIENT_CLINIC_OR_DEPARTMENT_OTHER): Payer: Self-pay

## 2024-02-10 ENCOUNTER — Other Ambulatory Visit (HOSPITAL_BASED_OUTPATIENT_CLINIC_OR_DEPARTMENT_OTHER): Payer: Self-pay

## 2024-02-10 DIAGNOSIS — I10 Essential (primary) hypertension: Secondary | ICD-10-CM | POA: Diagnosis not present

## 2024-02-10 DIAGNOSIS — H5711 Ocular pain, right eye: Secondary | ICD-10-CM | POA: Diagnosis not present

## 2024-02-10 DIAGNOSIS — N184 Chronic kidney disease, stage 4 (severe): Secondary | ICD-10-CM | POA: Diagnosis not present

## 2024-02-11 ENCOUNTER — Other Ambulatory Visit (HOSPITAL_BASED_OUTPATIENT_CLINIC_OR_DEPARTMENT_OTHER): Payer: Self-pay

## 2024-02-11 LAB — LAB REPORT - SCANNED: EGFR: 22

## 2024-02-11 MED ORDER — AMLODIPINE BESYLATE 10 MG PO TABS
10.0000 mg | ORAL_TABLET | Freq: Every day | ORAL | 0 refills | Status: AC
  Filled 2024-02-11: qty 90, 90d supply, fill #0

## 2024-02-12 ENCOUNTER — Other Ambulatory Visit (HOSPITAL_BASED_OUTPATIENT_CLINIC_OR_DEPARTMENT_OTHER): Payer: Self-pay

## 2024-02-12 MED ORDER — PREDNISOLONE ACETATE 1 % OP SUSP
1.0000 [drp] | Freq: Four times a day (QID) | OPHTHALMIC | 0 refills | Status: AC
  Filled 2024-02-12: qty 5, 13d supply, fill #0

## 2024-02-19 ENCOUNTER — Other Ambulatory Visit (HOSPITAL_BASED_OUTPATIENT_CLINIC_OR_DEPARTMENT_OTHER): Payer: Self-pay

## 2024-02-19 DIAGNOSIS — I129 Hypertensive chronic kidney disease with stage 1 through stage 4 chronic kidney disease, or unspecified chronic kidney disease: Secondary | ICD-10-CM | POA: Diagnosis not present

## 2024-02-19 DIAGNOSIS — N184 Chronic kidney disease, stage 4 (severe): Secondary | ICD-10-CM | POA: Diagnosis not present

## 2024-02-19 DIAGNOSIS — N179 Acute kidney failure, unspecified: Secondary | ICD-10-CM | POA: Diagnosis not present

## 2024-02-19 MED ORDER — LABETALOL HCL 300 MG PO TABS
300.0000 mg | ORAL_TABLET | Freq: Two times a day (BID) | ORAL | 2 refills | Status: DC
  Filled 2024-02-19: qty 60, 30d supply, fill #0

## 2024-02-20 ENCOUNTER — Other Ambulatory Visit (HOSPITAL_BASED_OUTPATIENT_CLINIC_OR_DEPARTMENT_OTHER): Payer: Self-pay

## 2024-02-26 ENCOUNTER — Other Ambulatory Visit: Payer: Self-pay

## 2024-02-26 DIAGNOSIS — S2500XS Unspecified injury of thoracic aorta, sequela: Secondary | ICD-10-CM

## 2024-03-03 DIAGNOSIS — I10 Essential (primary) hypertension: Secondary | ICD-10-CM | POA: Diagnosis not present

## 2024-03-03 DIAGNOSIS — N184 Chronic kidney disease, stage 4 (severe): Secondary | ICD-10-CM | POA: Diagnosis not present

## 2024-03-03 DIAGNOSIS — R600 Localized edema: Secondary | ICD-10-CM | POA: Diagnosis not present

## 2024-03-03 DIAGNOSIS — H6123 Impacted cerumen, bilateral: Secondary | ICD-10-CM | POA: Diagnosis not present

## 2024-03-05 ENCOUNTER — Other Ambulatory Visit (HOSPITAL_BASED_OUTPATIENT_CLINIC_OR_DEPARTMENT_OTHER): Payer: Self-pay

## 2024-03-06 ENCOUNTER — Other Ambulatory Visit (HOSPITAL_BASED_OUTPATIENT_CLINIC_OR_DEPARTMENT_OTHER): Payer: Self-pay

## 2024-03-06 MED ORDER — FLECAINIDE ACETATE 50 MG PO TABS
50.0000 mg | ORAL_TABLET | Freq: Two times a day (BID) | ORAL | 1 refills | Status: AC
  Filled 2024-03-06: qty 180, 90d supply, fill #0
  Filled 2024-04-23 – 2024-05-27 (×2): qty 180, 90d supply, fill #1

## 2024-03-09 ENCOUNTER — Other Ambulatory Visit (HOSPITAL_BASED_OUTPATIENT_CLINIC_OR_DEPARTMENT_OTHER): Payer: Self-pay

## 2024-03-09 MED ORDER — LABETALOL HCL 200 MG PO TABS
400.0000 mg | ORAL_TABLET | Freq: Two times a day (BID) | ORAL | 2 refills | Status: AC
  Filled 2024-03-09: qty 120, 30d supply, fill #0
  Filled 2024-04-23: qty 120, 30d supply, fill #1
  Filled 2024-05-19 (×2): qty 120, 30d supply, fill #2

## 2024-03-11 ENCOUNTER — Other Ambulatory Visit (HOSPITAL_BASED_OUTPATIENT_CLINIC_OR_DEPARTMENT_OTHER): Payer: Self-pay

## 2024-03-12 ENCOUNTER — Other Ambulatory Visit (HOSPITAL_BASED_OUTPATIENT_CLINIC_OR_DEPARTMENT_OTHER): Payer: Self-pay

## 2024-03-12 DIAGNOSIS — L299 Pruritus, unspecified: Secondary | ICD-10-CM | POA: Diagnosis not present

## 2024-03-12 DIAGNOSIS — L820 Inflamed seborrheic keratosis: Secondary | ICD-10-CM | POA: Diagnosis not present

## 2024-03-12 DIAGNOSIS — L300 Nummular dermatitis: Secondary | ICD-10-CM | POA: Diagnosis not present

## 2024-03-12 DIAGNOSIS — D485 Neoplasm of uncertain behavior of skin: Secondary | ICD-10-CM | POA: Diagnosis not present

## 2024-03-12 DIAGNOSIS — L570 Actinic keratosis: Secondary | ICD-10-CM | POA: Diagnosis not present

## 2024-03-12 MED ORDER — AMLODIPINE BESYLATE 5 MG PO TABS
5.0000 mg | ORAL_TABLET | Freq: Every day | ORAL | 3 refills | Status: DC
  Filled 2024-03-12: qty 90, 90d supply, fill #0

## 2024-03-13 ENCOUNTER — Other Ambulatory Visit (HOSPITAL_BASED_OUTPATIENT_CLINIC_OR_DEPARTMENT_OTHER): Payer: Self-pay

## 2024-03-16 ENCOUNTER — Other Ambulatory Visit (HOSPITAL_BASED_OUTPATIENT_CLINIC_OR_DEPARTMENT_OTHER): Payer: Self-pay

## 2024-03-17 ENCOUNTER — Other Ambulatory Visit (HOSPITAL_BASED_OUTPATIENT_CLINIC_OR_DEPARTMENT_OTHER): Payer: Self-pay

## 2024-03-17 ENCOUNTER — Ambulatory Visit (INDEPENDENT_AMBULATORY_CARE_PROVIDER_SITE_OTHER)
Admission: RE | Admit: 2024-03-17 | Discharge: 2024-03-17 | Disposition: A | Source: Ambulatory Visit | Attending: Vascular Surgery | Admitting: Radiology

## 2024-03-17 DIAGNOSIS — Z95828 Presence of other vascular implants and grafts: Secondary | ICD-10-CM | POA: Diagnosis not present

## 2024-03-17 DIAGNOSIS — S2500XS Unspecified injury of thoracic aorta, sequela: Secondary | ICD-10-CM

## 2024-03-17 DIAGNOSIS — I2510 Atherosclerotic heart disease of native coronary artery without angina pectoris: Secondary | ICD-10-CM | POA: Diagnosis not present

## 2024-03-19 ENCOUNTER — Other Ambulatory Visit (HOSPITAL_BASED_OUTPATIENT_CLINIC_OR_DEPARTMENT_OTHER): Payer: Self-pay

## 2024-03-19 ENCOUNTER — Other Ambulatory Visit: Payer: Self-pay

## 2024-03-19 DIAGNOSIS — I10 Essential (primary) hypertension: Secondary | ICD-10-CM | POA: Diagnosis not present

## 2024-03-19 DIAGNOSIS — R600 Localized edema: Secondary | ICD-10-CM | POA: Diagnosis not present

## 2024-03-19 DIAGNOSIS — R21 Rash and other nonspecific skin eruption: Secondary | ICD-10-CM | POA: Diagnosis not present

## 2024-03-19 DIAGNOSIS — N184 Chronic kidney disease, stage 4 (severe): Secondary | ICD-10-CM | POA: Diagnosis not present

## 2024-03-19 DIAGNOSIS — R251 Tremor, unspecified: Secondary | ICD-10-CM | POA: Diagnosis not present

## 2024-03-19 MED ORDER — PREDNISONE 20 MG PO TABS
20.0000 mg | ORAL_TABLET | Freq: Two times a day (BID) | ORAL | 0 refills | Status: AC
  Filled 2024-03-19: qty 10, 5d supply, fill #0

## 2024-03-27 DIAGNOSIS — H40013 Open angle with borderline findings, low risk, bilateral: Secondary | ICD-10-CM | POA: Diagnosis not present

## 2024-03-27 DIAGNOSIS — H353130 Nonexudative age-related macular degeneration, bilateral, stage unspecified: Secondary | ICD-10-CM | POA: Diagnosis not present

## 2024-03-27 DIAGNOSIS — H43813 Vitreous degeneration, bilateral: Secondary | ICD-10-CM | POA: Diagnosis not present

## 2024-03-27 DIAGNOSIS — H5203 Hypermetropia, bilateral: Secondary | ICD-10-CM | POA: Diagnosis not present

## 2024-03-27 DIAGNOSIS — H02889 Meibomian gland dysfunction of unspecified eye, unspecified eyelid: Secondary | ICD-10-CM | POA: Diagnosis not present

## 2024-03-27 DIAGNOSIS — H25013 Cortical age-related cataract, bilateral: Secondary | ICD-10-CM | POA: Diagnosis not present

## 2024-03-27 DIAGNOSIS — H524 Presbyopia: Secondary | ICD-10-CM | POA: Diagnosis not present

## 2024-03-27 DIAGNOSIS — H52221 Regular astigmatism, right eye: Secondary | ICD-10-CM | POA: Diagnosis not present

## 2024-03-27 DIAGNOSIS — H2513 Age-related nuclear cataract, bilateral: Secondary | ICD-10-CM | POA: Diagnosis not present

## 2024-03-31 ENCOUNTER — Other Ambulatory Visit (HOSPITAL_BASED_OUTPATIENT_CLINIC_OR_DEPARTMENT_OTHER): Payer: Self-pay

## 2024-03-31 DIAGNOSIS — C44529 Squamous cell carcinoma of skin of other part of trunk: Secondary | ICD-10-CM | POA: Diagnosis not present

## 2024-03-31 DIAGNOSIS — N184 Chronic kidney disease, stage 4 (severe): Secondary | ICD-10-CM | POA: Diagnosis not present

## 2024-03-31 DIAGNOSIS — N179 Acute kidney failure, unspecified: Secondary | ICD-10-CM | POA: Diagnosis not present

## 2024-03-31 DIAGNOSIS — N2581 Secondary hyperparathyroidism of renal origin: Secondary | ICD-10-CM | POA: Diagnosis not present

## 2024-03-31 DIAGNOSIS — D631 Anemia in chronic kidney disease: Secondary | ICD-10-CM | POA: Diagnosis not present

## 2024-03-31 DIAGNOSIS — I129 Hypertensive chronic kidney disease with stage 1 through stage 4 chronic kidney disease, or unspecified chronic kidney disease: Secondary | ICD-10-CM | POA: Diagnosis not present

## 2024-03-31 MED ORDER — FUROSEMIDE 20 MG PO TABS
20.0000 mg | ORAL_TABLET | Freq: Every day | ORAL | 4 refills | Status: AC
  Filled 2024-03-31: qty 30, 30d supply, fill #0
  Filled 2024-06-11: qty 30, 30d supply, fill #1

## 2024-04-02 ENCOUNTER — Other Ambulatory Visit (HOSPITAL_COMMUNITY): Payer: Self-pay

## 2024-04-02 ENCOUNTER — Encounter: Payer: Self-pay | Admitting: Vascular Surgery

## 2024-04-02 ENCOUNTER — Ambulatory Visit: Attending: Vascular Surgery | Admitting: Vascular Surgery

## 2024-04-02 ENCOUNTER — Other Ambulatory Visit (HOSPITAL_BASED_OUTPATIENT_CLINIC_OR_DEPARTMENT_OTHER): Payer: Self-pay

## 2024-04-02 VITALS — BP 145/78 | HR 64 | Temp 98.3°F | Resp 20 | Ht 70.0 in | Wt 157.3 lb

## 2024-04-02 DIAGNOSIS — Z95828 Presence of other vascular implants and grafts: Secondary | ICD-10-CM

## 2024-04-02 DIAGNOSIS — I71012 Dissection of descending thoracic aorta: Secondary | ICD-10-CM

## 2024-04-02 DIAGNOSIS — Z9889 Other specified postprocedural states: Secondary | ICD-10-CM | POA: Diagnosis not present

## 2024-04-02 NOTE — Progress Notes (Signed)
 POST OPERATIVE OFFICE NOTE    HPI:  Jahad Old is a 78 y.o. male who is here for follow-up.  Rjay has previously undergone right-sided carotid endarterectomy on 08/20/2023 for asymptomatic stenosis.  He recovered well. Simon was hospitalized a few months later with descending thoracic intramural hematoma.  He was initially started on anti-impulse control, however remained symptomatic with follow-up imaging demonstrate concern for contained rupture.  He was taken to surgery by my partner Dr. Magda for TEVAR, which went well.  Overall, Dawaun's hospitalization was 12 days with a significant stent of that in the ICU.  At his initial follow-up visit, his major complaint was lower extremity edema.  He felt like this was due to amlodipine .  He had waxing and waning back pain, but was symptom-free at the time of his visit.    On exam today, Ted was doing okay.  He stated his kidney function has dropped, and his GFR is now consistent with stage IV-stage V CKD.  He has a dull aching pain in the right flank.  Also notes intermittent nocturnal angina which wakes him up at night, and resolves after nitroglycerin .    Medications include Xarelto  for A-fib, aspirin , high intensity statin, Repatha .   Allergies  Allergen Reactions   Roxicodone  [Oxycodone ] Nausea And Vomiting   Pravachol  [Pravastatin ] Other (See Comments)    Myalgias    Sulfa Antibiotics Itching   Zohydro Er [Hydrocodone Bitartrate Er] Nausea Only   Eliquis  [Apixaban ] Itching and Rash   Hydrochlorothiazide Anxiety   Hytrin [Terazosin Hcl] Palpitations and Other (See Comments)    Heart races   Pradaxa  [Dabigatran  Etexilate Mesylate] Itching and Rash   Xarelto  [Rivaroxaban ] Itching and Rash    Current Outpatient Medications  Medication Sig Dispense Refill   acetaminophen  (TYLENOL ) 500 MG tablet Take 500 mg by mouth every 6 (six) hours as needed for mild pain (pain score 1-3), moderate pain (pain score 4-6) or headache.     amLODipine   (NORVASC ) 10 MG tablet Take 1 tablet (10 mg total) by mouth daily. 90 tablet 0   amLODipine  (NORVASC ) 5 MG tablet Take 1 tablet (5 mg total) by mouth daily for blood pressure 90 tablet 3   ammonium lactate  (LAC-HYDRIN ) 12 % lotion Apply 1 Application topically 2 (two) times daily. 400 g 3   aspirin  EC 81 MG tablet Take 81 mg by mouth every evening.     betamethasone  dipropionate 0.05 % lotion Apply 1 application  topically 2 (two) times daily as needed (Psoriasis).     betamethasone  dipropionate 0.05 % lotion Apply 1 Application topically to scalp 2 (two) times daily as needed. 60 mL 1   Cholecalciferol (VITAMIN D-3 PO) Take 1 capsule by mouth daily.     Coenzyme Q10 (COQ10 PO) Take 1 capsule by mouth every evening.     Evolocumab  (REPATHA  SURECLICK) 140 MG/ML SOAJ Inject 140 mg into the skin every 14 (fourteen) days. (Patient taking differently: Inject 140 mg into the skin See admin instructions. Inject 1mL (140mg ) into the skin every other Saturday.) 6 mL 3   Evolocumab  (REPATHA  SURECLICK) 140 MG/ML SOAJ Inject 140 mg into the skin every 14 (fourteen) days.     famotidine  (PEPCID ) 40 MG tablet Take 1 tablet (40 mg total) by mouth at bedtime. 90 tablet 2   flecainide  (TAMBOCOR ) 50 MG tablet Take 1 tablet (50 mg total) by mouth 2 (two) times daily. 180 tablet 1   furosemide  (LASIX ) 20 MG tablet Take 1 tablet (20 mg total)  by mouth daily. 30 tablet 4   hydrALAZINE  (APRESOLINE ) 25 MG tablet Take 1 tablet (25 mg total) by mouth 2 (two) times daily. 60 tablet 2   isosorbide  mononitrate (IMDUR ) 30 MG 24 hr tablet Take 1 tablet by mouth once daily. 90 tablet 3   labetalol  (NORMODYNE ) 200 MG tablet Take 2 tablets (400 mg total) by mouth 2 (two) times daily. 120 tablet 2   labetalol  (NORMODYNE ) 300 MG tablet Take 1 tablet (300 mg total) by mouth 2 (two) times daily. 60 tablet 2   loratadine (CLARITIN) 10 MG tablet Take 10 mg by mouth daily.     metoprolol  tartrate (LOPRESSOR ) 50 MG tablet Take 1 tablet (50  mg total) by mouth 2 (two) times daily. 60 tablet 2   neomycin -polymyxin b-dexamethasone  (MAXITROL ) 3.5-10000-0.1 SUSP Place 2-3 drops into affected eyes 2 (two) times daily. 10 mL 0   nitroGLYCERIN  (NITROSTAT ) 0.4 MG SL tablet Place 1 tablet (0.4 mg total) under the tongue every 5 (five) minutes as needed for chest pain. 25 tablet 3   Omega-3 Fatty Acids (FISH OIL PO) Take 1 capsule by mouth daily.     pantoprazole  (PROTONIX ) 40 MG tablet Take 1 tablet (40 mg total) by mouth 2 (two) times daily. 60 tablet 0   predniSONE (DELTASONE) 20 MG tablet Take 1 tablet (20 mg total) by mouth 2 (two) times daily. 10 tablet 0   promethazine -dextromethorphan  (PROMETHAZINE -DM) 6.25-15 MG/5ML syrup Take 5-10 mLs by mouth every 6 (six) hours as needed for cough 180 mL 0   Rivaroxaban  (XARELTO ) 15 MG TABS tablet Take 1 tablet (15 mg total) by mouth daily. 30 tablet 11   tamsulosin  (FLOMAX ) 0.4 MG CAPS capsule Take 1 capsule (0.4 mg total) by mouth daily. 30 capsule 2   traMADol  (ULTRAM ) 50 MG tablet Take 1 tablet (50 mg total) by mouth every 6 (six) hours as needed. 12 tablet 0   Triamcinolone  Acetonide (KENALOG  IJ) Inject 1 Dose into the muscle every 3 (three) months.     triamcinolone  cream (KENALOG ) 0.1 % Apply topically 2 (two) times daily. (Patient taking differently: Apply 1 Application topically 2 (two) times daily as needed (skin irritation, itching).) 454 g 1   triamcinolone  cream (KENALOG ) 0.1 % Apply 1 Application topically 2 (two) times daily. 454 g 3   No current facility-administered medications for this visit.     ROS:  See HPI  Physical Exam: Physical Exam Constitutional:      Appearance: Normal appearance.  HENT:     Head: Normocephalic.  Cardiovascular:     Rate and Rhythm: Normal rate and regular rhythm.  Pulmonary:     Effort: Pulmonary effort is normal.  Chest:     Chest wall: No tenderness.  Abdominal:     General: There is no distension.     Palpations: Abdomen is soft.      Tenderness: There is no abdominal tenderness.  Musculoskeletal:        General: Normal range of motion.     Cervical back: No rigidity.  Neurological:     Mental Status: He is alert and oriented to person, place, and time.        IMPRESSION: 1. Endoluminal stent graft in the descending thoracic aorta with surrounding circumferential soft tissue compatible with intramural hematoma This rind - like soft tissue demonstrates an interval decrease in size since the prior examination, now essentially resolved superiorly and stable at the level of the heart base; maximal descending thoracic aortic diameter unchanged  at 4.6 cm. 2. Extensive multivessel coronary artery calcifications.    Assessment/Plan:  This is a 77 y.o. male here for follow-up.  Elective right-sided carotid endarterectomy for asymptomatic stenosis-ultrasound demonstrated no residual stenosis.  Widely patent. Plan to follow this on a yearly basis with repeat carotid duplex ultrasound.  Mild disease on the left.  Regarding his TEVAR, CT Noncon from October was reviewed.  The aorta appears to be healing with less intramural hematoma present than previous.  No concerning features.  Will follow this on a yearly basis.  His last carotid duplex ultrasound was 3 months ago, therefore my plan is to see him in 9 months with repeat noncontrasted CT scan, as well as carotid duplex ultrasound study.  The intermittent nocturnal angina Haruo is describing is concerning.  He states that while intermittent, nitro does help his symptoms.  I plan to reach out to Dr. Santa office in an effort to have him evaluated.   Graviel Payeur E Chaze Hruska

## 2024-04-03 DIAGNOSIS — N184 Chronic kidney disease, stage 4 (severe): Secondary | ICD-10-CM | POA: Diagnosis not present

## 2024-04-03 DIAGNOSIS — N261 Atrophy of kidney (terminal): Secondary | ICD-10-CM | POA: Diagnosis not present

## 2024-04-03 DIAGNOSIS — I700 Atherosclerosis of aorta: Secondary | ICD-10-CM | POA: Diagnosis not present

## 2024-04-03 DIAGNOSIS — J90 Pleural effusion, not elsewhere classified: Secondary | ICD-10-CM | POA: Diagnosis not present

## 2024-04-03 DIAGNOSIS — N179 Acute kidney failure, unspecified: Secondary | ICD-10-CM | POA: Diagnosis not present

## 2024-04-03 DIAGNOSIS — N281 Cyst of kidney, acquired: Secondary | ICD-10-CM | POA: Diagnosis not present

## 2024-04-06 ENCOUNTER — Telehealth: Payer: Self-pay | Admitting: *Deleted

## 2024-04-06 ENCOUNTER — Other Ambulatory Visit (HOSPITAL_BASED_OUTPATIENT_CLINIC_OR_DEPARTMENT_OTHER): Payer: Self-pay | Admitting: Nephrology

## 2024-04-06 DIAGNOSIS — N184 Chronic kidney disease, stage 4 (severe): Secondary | ICD-10-CM

## 2024-04-06 DIAGNOSIS — N179 Acute kidney failure, unspecified: Secondary | ICD-10-CM

## 2024-04-06 NOTE — Telephone Encounter (Signed)
-----   Message from Lonni Cash sent at 04/03/2024 12:56 PM EST ----- Walterine Cole, Happy to see him back.   Pat, Can we get him in to see me or one of the office APPs?   [place] ----- Message ----- From: Lanis Fonda BRAVO, MD Sent: 04/02/2024   4:03 PM EST To: Lonni JONETTA Cash, MD  Hey man,  I followed this guy after CEA and thoracic aortic repair. On exam today, he was complaining of some nocturnal angina that wakes him up from sleep and improves with nitro.  Would you mind seeing him?  I think it has been a while  - [facility] [employer]

## 2024-04-06 NOTE — Telephone Encounter (Signed)
 I spoke with patient and his wife.  I offered appointment this afternoon with Dayna Dunn, PA.  Patient's wife reports patient had chest pain and elevated BP last Friday.  Has also been having a great deal of back pain and reports kidney issues.  He cannot come in today for office visit due to back pain.  He does not know when he will be able to schedule appointment.  Will continue to follow up with patient regarding appointment. ED precautions reviewed with patient

## 2024-04-09 ENCOUNTER — Other Ambulatory Visit (HOSPITAL_BASED_OUTPATIENT_CLINIC_OR_DEPARTMENT_OTHER): Payer: Self-pay

## 2024-04-10 ENCOUNTER — Other Ambulatory Visit (HOSPITAL_BASED_OUTPATIENT_CLINIC_OR_DEPARTMENT_OTHER): Payer: Self-pay

## 2024-04-13 ENCOUNTER — Telehealth: Payer: Self-pay

## 2024-04-13 DIAGNOSIS — D539 Nutritional anemia, unspecified: Secondary | ICD-10-CM | POA: Diagnosis not present

## 2024-04-13 DIAGNOSIS — R7309 Other abnormal glucose: Secondary | ICD-10-CM | POA: Diagnosis not present

## 2024-04-13 DIAGNOSIS — E785 Hyperlipidemia, unspecified: Secondary | ICD-10-CM | POA: Diagnosis not present

## 2024-04-13 DIAGNOSIS — I10 Essential (primary) hypertension: Secondary | ICD-10-CM | POA: Diagnosis not present

## 2024-04-13 NOTE — Telephone Encounter (Signed)
 Pt's wife, [name], called to report pt's Xarelto  is causing him to itch.  Called pt back @ 0925 and LM on VM to call back.  Will advise pt to follow up with prescriber of the Xarelto .

## 2024-04-15 NOTE — Telephone Encounter (Signed)
 I spoke with patient's wife and scheduled patient to see Dr Verlin on 12/1 at 3 PM

## 2024-04-20 ENCOUNTER — Telehealth (HOSPITAL_BASED_OUTPATIENT_CLINIC_OR_DEPARTMENT_OTHER): Payer: Self-pay | Admitting: Pharmacy Technician

## 2024-04-20 ENCOUNTER — Encounter: Payer: Self-pay | Admitting: Cardiovascular Disease

## 2024-04-20 ENCOUNTER — Ambulatory Visit: Attending: Cardiovascular Disease | Admitting: Cardiovascular Disease

## 2024-04-20 ENCOUNTER — Other Ambulatory Visit (HOSPITAL_BASED_OUTPATIENT_CLINIC_OR_DEPARTMENT_OTHER): Payer: Self-pay

## 2024-04-20 VITALS — BP 140/82 | HR 67 | Ht 70.0 in | Wt 152.4 lb

## 2024-04-20 DIAGNOSIS — I6523 Occlusion and stenosis of bilateral carotid arteries: Secondary | ICD-10-CM

## 2024-04-20 DIAGNOSIS — I6521 Occlusion and stenosis of right carotid artery: Secondary | ICD-10-CM

## 2024-04-20 DIAGNOSIS — I10 Essential (primary) hypertension: Secondary | ICD-10-CM | POA: Diagnosis not present

## 2024-04-20 DIAGNOSIS — I71012 Dissection of descending thoracic aorta: Secondary | ICD-10-CM

## 2024-04-20 DIAGNOSIS — I480 Paroxysmal atrial fibrillation: Secondary | ICD-10-CM

## 2024-04-20 DIAGNOSIS — I2510 Atherosclerotic heart disease of native coronary artery without angina pectoris: Secondary | ICD-10-CM

## 2024-04-20 DIAGNOSIS — E7800 Pure hypercholesterolemia, unspecified: Secondary | ICD-10-CM | POA: Diagnosis not present

## 2024-04-20 MED ORDER — DABIGATRAN ETEXILATE MESYLATE 75 MG PO CAPS
75.0000 mg | ORAL_CAPSULE | Freq: Two times a day (BID) | ORAL | 6 refills | Status: AC
  Filled 2024-04-20 – 2024-04-21 (×2): qty 60, 30d supply, fill #0

## 2024-04-20 NOTE — Progress Notes (Signed)
 Chief Complaint  Patient presents with   Follow-up    CAD, PAF   History of Present Illness:  78 yo male with history of carotid artery disease, chronic kidney disease, CAD, paroxysmal atrial fibrillation, carotid artery disease s/p right carotid endarterectomy, dissecting AAA s/p TEVAR, HTN and hyperlipidemia here today for follow up. I saw him as a new patient 02/20/18 for evaluation of atrial fibrillation. He had a cardiac cath in 2010 and was found to have mild CAD. He was suspected to have atheroemboli during the cath and had worsened renal insufficiency post cath. He had an AV fistula placed back in 2011 and this was removed in 2015 as he never progressed to ESRD. He was found to have atrial fibrillation in 2019. He did not tolerate Eliquis , Pradaxa  or Xarelto  due to a rash. He was seen in EP clinic by Dr. Kelsie and was started on Flecainide . He refused to take coumadin and did not wish to try any of the DOACs again. He was seen in our office June 2024 by Dr. Jeffrie and c/o chest pain. Nuclear stress test low risk without ischemia in June 2024. I saw him in July 2024 and he c/o fatigue and described occasional chest pain at rest and with exertion. He did not wish to consider a cardiac cath due to prior complications during cath and fear of worsening of his renal function post cath. Ranexa  was added at the visit in July 2024 but stopped and changed to Imdur . He was seen in our office in January 2025 and reported more frequent episodes of atrial fibrillation. His Flecainide  was increased but he did not tolerate the higher dose of Flecainide . Cardiac monitor February 2025 with 1% atrial fib burden. Echo February 2025 with LVEF=55-60%. No significant valve disease. Carotid artery dopplers in February 2025 with high grade RICA stenosis. He underwent right carotid endarterectomy in April 2025. He was admitted in June 2025 with descending thoracic aortic aneurysm with dissection treated with TEVAR. He had  atrial fib with RVR while hospitalized and was discharged on Xarelto . He called our office and reported itching after starting Xarelto .  He is here today for follow up. He has been having pain in his chest when he is in bed. The pain happens if he is supine or on his left side. It resolves if he sits up and rolls over to his right side. No exertional chest pain. No dyspnea, palpitations, lower extremity edema, orthopnea, PND, dizziness, near syncope or syncope. He has been told by Dr. Tobie that he may need to start dialysis soon. He stopped his Xarelto  last week due to a rash.    Primary Care Physician: Erick Greig LABOR, NP  Past Medical History:  Diagnosis Date   Allergy    Arthritis    psoriatic arthritis    Asthma    Atheroembolic kidney disease (HCC)    Atheroembolism    CAD (coronary artery disease)    hx non obst   Cancer (HCC)    basal cell x3 , last one removed - 12/12/2013, R temple area of forehead   Carotid artery occlusion    Chronic kidney disease    renal stones - tx. /w lithotripsy   Complication of anesthesia    GERD (gastroesophageal reflux disease)    HLD (hyperlipidemia)    HTN (hypertension)    Hypoglycemia    Paroxysmal atrial fibrillation (HCC)    PONV (postoperative nausea and vomiting)    Pruritus    Psoriasis  Shortness of breath    after eating , if he gets active, he might have some SOB   Vitamin D deficiency     Past Surgical History:  Procedure Laterality Date   AV FISTULA PLACEMENT  07-2009   Left Brachiocephalic AVF   CARDIAC CATHETERIZATION  2010   COLONOSCOPY  09/15/2012   Colonic polyp status post polypectomy. Mild sigmoid diverticulosis. Small internal hemorrhoids. Otherwise normal colonoscopy    ENDARTERECTOMY Right 08/20/2023   Procedure: ENDARTERECTOMY, CAROTID;  Surgeon: Lanis Fonda BRAVO, MD;  Location: [facility] OR;  Service: Vascular;  Laterality: Right;   LIGATION OF ARTERIOVENOUS  FISTULA Left 12/30/2013   Procedure: EXCISION OF LEFT ARM  ARTERIOVENOUS  FISTULA;  Surgeon: Krystal JULIANNA Doing, MD;  Location: [facility] OR;  Service: Vascular;  Laterality: Left;   LITHOTRIPSY     PATCH ANGIOPLASTY Right 08/20/2023   Procedure: PATCH ANGIOPLASTY USING GEORGE BIOLOGIC PATCH;  Surgeon: Lanis Fonda BRAVO, MD;  Location: [facility] OR;  Service: Vascular;  Laterality: Right;   skin cancer removed     multiple basal cell   THORACIC AORTIC ENDOVASCULAR STENT GRAFT N/A 11/14/2023   Procedure: INSERTION, ENDOVASCULAR STENT GRAFT, AORTA, THORACIC;  Surgeon: Magda Debby SAILOR, MD;  Location: [facility] OR;  Service: Vascular;  Laterality: N/A;    Current Outpatient Medications  Medication Sig Dispense Refill   acetaminophen  (TYLENOL ) 500 MG tablet Take 500 mg by mouth every 6 (six) hours as needed for mild pain (pain score 1-3), moderate pain (pain score 4-6) or headache.     amLODipine  (NORVASC ) 10 MG tablet Take 1 tablet (10 mg total) by mouth daily. (Patient taking differently: Take 10 mg by mouth 2 (two) times daily.) 90 tablet 0   ammonium lactate  (LAC-HYDRIN ) 12 % lotion Apply 1 Application topically 2 (two) times daily. 400 g 3   aspirin  EC 81 MG tablet Take 81 mg by mouth every evening.     betamethasone  dipropionate 0.05 % lotion Apply 1 application  topically 2 (two) times daily as needed (Psoriasis).     betamethasone  dipropionate 0.05 % lotion Apply 1 Application topically to scalp 2 (two) times daily as needed. 60 mL 1   Cholecalciferol (VITAMIN D-3 PO) Take 1 capsule by mouth daily.     clobetasol cream (TEMOVATE) 0.05 % Apply 1 Application topically 2 (two) times daily.     Coenzyme Q10 (COQ10 PO) Take 1 capsule by mouth every evening.     dabigatran  (PRADAXA ) 75 MG CAPS capsule Take 1 capsule (75 mg total) by mouth 2 (two) times daily. 60 capsule 6   Evolocumab  (REPATHA  SURECLICK) 140 MG/ML SOAJ Inject 140 mg into the skin every 14 (fourteen) days. (Patient taking differently: Inject 140 mg into the skin See admin instructions. Inject 1mL (140mg ) into the  skin every other Saturday.) 6 mL 3   famotidine  (PEPCID ) 40 MG tablet Take 1 tablet (40 mg total) by mouth at bedtime. (Patient taking differently: Take 20 mg by mouth at bedtime.) 90 tablet 2   fexofenadine (ALLEGRA) 180 MG tablet Take 180 mg by mouth daily.     flecainide  (TAMBOCOR ) 50 MG tablet Take 1 tablet (50 mg total) by mouth 2 (two) times daily. 180 tablet 1   furosemide  (LASIX ) 20 MG tablet Take 1 tablet (20 mg total) by mouth daily. 30 tablet 4   isosorbide  mononitrate (IMDUR ) 30 MG 24 hr tablet Take 1 tablet by mouth once daily. 90 tablet 3   labetalol  (NORMODYNE ) 200 MG tablet Take 2 tablets (400  mg total) by mouth 2 (two) times daily. 120 tablet 2   loratadine (CLARITIN) 10 MG tablet Take 10 mg by mouth daily.     neomycin -polymyxin b-dexamethasone  (MAXITROL ) 3.5-10000-0.1 SUSP Place 2-3 drops into affected eyes 2 (two) times daily. 10 mL 0   nitroGLYCERIN  (NITROSTAT ) 0.4 MG SL tablet Place 1 tablet (0.4 mg total) under the tongue every 5 (five) minutes as needed for chest pain. 25 tablet 3   pantoprazole  (PROTONIX ) 40 MG tablet Take 1 tablet (40 mg total) by mouth 2 (two) times daily. 60 tablet 0   predniSONE  (DELTASONE ) 20 MG tablet Take 1 tablet (20 mg total) by mouth 2 (two) times daily. 10 tablet 0   promethazine -dextromethorphan  (PROMETHAZINE -DM) 6.25-15 MG/5ML syrup Take 5-10 mLs by mouth every 6 (six) hours as needed for cough 180 mL 0   traMADol  (ULTRAM ) 50 MG tablet Take 1 tablet (50 mg total) by mouth every 6 (six) hours as needed. 12 tablet 0   Triamcinolone  Acetonide (KENALOG  IJ) Inject 1 Dose into the muscle every 3 (three) months.     triamcinolone  cream (KENALOG ) 0.1 % Apply topically 2 (two) times daily. 454 g 1   hydrALAZINE  (APRESOLINE ) 25 MG tablet Take 1 tablet (25 mg total) by mouth 2 (two) times daily. (Patient not taking: Reported on 04/20/2024) 60 tablet 2   metoprolol  tartrate (LOPRESSOR ) 50 MG tablet Take 1 tablet (50 mg total) by mouth 2 (two) times daily.  (Patient not taking: Reported on 04/20/2024) 60 tablet 2   Omega-3 Fatty Acids (FISH OIL PO) Take 1 capsule by mouth daily. (Patient not taking: Reported on 04/20/2024)     tamsulosin  (FLOMAX ) 0.4 MG CAPS capsule Take 1 capsule (0.4 mg total) by mouth daily. (Patient not taking: Reported on 04/20/2024) 30 capsule 2   No current facility-administered medications for this visit.    Allergies  Allergen Reactions   Roxicodone  [Oxycodone ] Nausea And Vomiting   Pravachol  [Pravastatin ] Other (See Comments)    Myalgias    Sulfa Antibiotics Itching   Zohydro Er [Hydrocodone Bitartrate Er] Nausea Only   Eliquis  [Apixaban ] Itching and Rash   Hydrochlorothiazide Anxiety   Hytrin [Terazosin Hcl] Palpitations and Other (See Comments)    Heart races   Pradaxa  [Dabigatran  Etexilate Mesylate] Itching and Rash   Xarelto  [Rivaroxaban ] Itching and Rash    Social History   Socioeconomic History   Marital status: Married    Spouse name: Not on file   Number of children: 0   Years of education: Not on file   Highest education level: Not on file  Occupational History   Occupation: Retired from a education officer, environmental  Tobacco Use   Smoking status: Former    Current packs/day: 0.00    Types: Cigarettes    Quit date: 05/21/1970    Years since quitting: 53.9   Smokeless tobacco: Never   Tobacco comments:    quit 40+ years ago   Vaping Use   Vaping status: Never Used  Substance and Sexual Activity   Alcohol use: No   Drug use: No   Sexual activity: Not on file  Other Topics Concern   Not on file  Social History Narrative   Married, lives with wife; designer, television/film set.   Lives in [facility] [place] [facility]   Social Drivers of Health   Financial Resource Strain: Not on file  Food Insecurity: No Food Insecurity (11/10/2023)   Hunger Vital Sign    Worried About Running Out of Food in the Last Year: Never  true    Ran Out of Food in the Last Year: Never true  Transportation Needs: No Transportation Needs  (11/10/2023)   PRAPARE - Administrator, Civil Service (Medical): No    Lack of Transportation (Non-Medical): No  Physical Activity: Not on file  Stress: Not on file  Social Connections: Unknown (11/11/2023)   Social Connection and Isolation Panel    Frequency of Communication with Friends and Family: Not on file    Frequency of Social Gatherings with Friends and Family: Not on file    Attends Religious Services: Not on file    Active Member of Clubs or Organizations: Not on file    Attends Club or Organization Meetings: 1 to 4 times per year    Marital Status: Married  Catering Manager Violence: Not At Risk (11/10/2023)   Humiliation, Afraid, Rape, and Kick questionnaire    Fear of Current or Ex-Partner: No    Emotionally Abused: No    Physically Abused: No    Sexually Abused: No    Family History  Problem Relation Age of Onset   Cancer Mother    Heart disease Father        Before age 63   Asthma Father    Heart disease Brother        After 72 years of age   Coronary artery disease Other        family hx   Colon cancer Neg Hx    Esophageal cancer Neg Hx    Liver cancer Neg Hx    Pancreatic cancer Neg Hx    Rectal cancer Neg Hx    Stomach cancer Neg Hx     Review of Systems:  As stated in the HPI and otherwise negative.   BP (!) 140/82   Pulse 67   Ht 5' 10 (1.778 m)   Wt 152 lb 6.4 oz (69.1 kg)   SpO2 98%   BMI 21.87 kg/m   Physical Examination: General: Well developed, well nourished, NAD  HEENT: OP clear, mucus membranes moist  SKIN: warm, dry. No rashes. Neuro: No focal deficits  Musculoskeletal: Muscle strength 5/5 all ext  Psychiatric: Mood and affect normal  Neck: No JVD, no carotid bruits, no thyromegaly, no lymphadenopathy.  Lungs:Clear bilaterally, no wheezes, rhonci, crackles Cardiovascular: Regular rate and rhythm. No murmurs, gallops or rubs. Abdomen:Soft. Bowel sounds present. Non-tender.  Extremities: No lower extremity edema.  Pulses are 2 + in the bilateral DP/PT.  EKG:  EKG is  ordered today. The ekg ordered today demonstrates  EKG Interpretation Date/Time:  Monday April 20 2024 15:04:28 EST Ventricular Rate:  57 PR Interval:  178 QRS Duration:  94 QT Interval:  420 QTC Calculation: 408 R Axis:   20  Text Interpretation: Sinus bradycardia Minimal voltage criteria for LVH, may be normal variant ( Sokolow-Lyon ) Nonspecific ST abnormality When compared with ECG of 20-Dec-2023 10:09, No significant change was found Confirmed by Verlin Bruckner [phone]) on 04/20/2024 3:13:08 PM  EKG Interpretation Date/Time:  Monday April 20 2024 15:04:28 EST Ventricular Rate:  57 PR Interval:  178 QRS Duration:  94 QT Interval:  420 QTC Calculation: 408 R Axis:   20  Text Interpretation: Sinus bradycardia Minimal voltage criteria for LVH, may be normal variant ( Sokolow-Lyon ) Nonspecific ST abnormality When compared with ECG of 20-Dec-2023 10:09, No significant change was found Confirmed by Verlin Bruckner [phone]) on 04/20/2024 3:13:08 PM    Lipid Panel: Lipids followed in primary care  Recent Labs: 11/11/2023: B Natriuretic Peptide 523.9; TSH 5.158 11/16/2023: Magnesium  2.5 11/29/2023: Hemoglobin 12.9; Platelets 450.0 12/02/2023: ALT 34; BUN 20; Creatinine, Ser 1.67; Potassium 4.1; Sodium 139   Lipid Panel  Wt Readings from Last 3 Encounters:  04/20/24 152 lb 6.4 oz (69.1 kg)  04/02/24 157 lb 4.8 oz (71.4 kg)  01/02/24 155 lb 11.2 oz (70.6 kg)    Assessment and Plan:   1. Paroxysmal atrial fibrillation: Sinus today. He is not tolerating Xarelto  due to a rash. He has had a rash in the past with Eliquis  as well. He is not sure why he did not tolerate Pradaxa .  - Continue Flecainide , Labetalol .  - Start Pradaxa  75 mg BID (renal dosing)  2. CAD with stable angina: He is having atypical chest pain at night but improves if he sits up or rolls over. No exertional chest pain. I do not think his pain is  cardiac related.  He is not a candidate for a cardiac cath right now given his stage IV CKD. He also does not wish to consider this given issues with his cath in 2010 (distal embolization occurred with cath in 2010).  Continue Labetalol , ASA, Repatha  and Imdur . SL NTG as needed.  - I have asked him to take his Pepcid  every night with possible reflux. Protonix  recently stopped in primary care.     3. Carotid artery disease:  Right carotid endarterectomy April 2025 per Dr. Lanis.     4. HTN: BP is controlled today. Continue current therapy  5. Hyperlipidemia: LDL 103 but he had been off of Repatha . Continue Repatha .   6. Dissecting aneurysm of the the descending aorta: Followed in [facility] post TEVAR.   Labs/ tests ordered today include:   Orders Placed This Encounter  Procedures   EKG 12-Lead   Disposition:   F/U with me in 12 months.   Signed, Lonni Cash, MD 04/20/2024 3:59 PM    [facility] [address] [place], [place], [place]  [address] Phone: [phone]; Fax: [phone]

## 2024-04-20 NOTE — Patient Instructions (Addendum)
 Medication Instructions:  Start Pradaxa  75 mg by mouth twice daily  *If you need a refill on your cardiac medications before your next appointment, please call your pharmacy*  Lab Work: none If you have labs (blood work) drawn today and your tests are completely normal, you will receive your results only by: MyChart Message (if you have MyChart) OR A paper copy in the mail If you have any lab test that is abnormal or we need to change your treatment, we will call you to review the results.  Testing/Procedures: none  Follow-Up: At [facility], you and your health needs are our priority.  As part of our continuing mission to provide you with exceptional heart care, our providers are all part of one team.  This team includes your primary Cardiologist (physician) and Advanced Practice Providers or APPs (Physician Assistants and Nurse Practitioners) who all work together to provide you with the care you need, when you need it.  Your next appointment:   July 22, 2024 at 2:40  Provider:   Lonni Cash, MD    We recommend signing up for the patient portal called MyChart.  Sign up information is provided on this After Visit Summary.  MyChart is used to connect with patients for Virtual Visits (Telemedicine).  Patients are able to view lab/test results, encounter notes, upcoming appointments, etc.  Non-urgent messages can be sent to your provider as well.   To learn more about what you can do with MyChart, go to [email or web address].   Other Instructions

## 2024-04-20 NOTE — Telephone Encounter (Signed)
 Medication: Dabigatran  Able to fill? No Prior authorization required? Yes Co-pay before assistance: n/a  Insurance states product non-formulary; tried brand Pradaxa  with DAW 9, also non-formulary

## 2024-04-21 ENCOUNTER — Other Ambulatory Visit (HOSPITAL_BASED_OUTPATIENT_CLINIC_OR_DEPARTMENT_OTHER): Payer: Self-pay

## 2024-04-21 ENCOUNTER — Other Ambulatory Visit (HOSPITAL_COMMUNITY): Payer: Self-pay

## 2024-04-21 ENCOUNTER — Telehealth: Payer: Self-pay | Admitting: Pharmacy Technician

## 2024-04-21 ENCOUNTER — Telehealth (HOSPITAL_BASED_OUTPATIENT_CLINIC_OR_DEPARTMENT_OTHER): Payer: Self-pay

## 2024-04-21 NOTE — Telephone Encounter (Signed)
 PA request has been Submitted. New Encounter has been or will be created for follow up. For additional info see Pharmacy Prior Auth telephone encounter from 04/21/24.

## 2024-04-21 NOTE — Telephone Encounter (Signed)
   Pharmacy Patient Advocate Encounter   Received notification from Pt Calls Messages that prior authorization for dabigatran  75mg  is required/requested.   Insurance verification completed.   The patient is insured through [facility] ADVANTAGE/RX ADVANCE.   Per test claim: PA required; PA submitted to above mentioned insurance via Latent Key/confirmation #/EOC [id number] Status is pending    Allergic to xarelto  and eliquis

## 2024-04-21 NOTE — Telephone Encounter (Signed)
 error

## 2024-04-21 NOTE — Telephone Encounter (Signed)
 PA request has been Received. New Encounter has been or will be created for follow up. For additional info see Pharmacy Prior Auth telephone encounter from 04/21/24.

## 2024-04-21 NOTE — Telephone Encounter (Signed)
 Pharmacy Patient Advocate Encounter  Received notification from [employer] that Prior Authorization for dabigatran  has been APPROVED from 04/21/24 to 05/20/24   PA #/Case ID/Reference #: [id number]

## 2024-04-22 ENCOUNTER — Other Ambulatory Visit (HOSPITAL_BASED_OUTPATIENT_CLINIC_OR_DEPARTMENT_OTHER): Payer: Self-pay

## 2024-04-23 ENCOUNTER — Other Ambulatory Visit (HOSPITAL_BASED_OUTPATIENT_CLINIC_OR_DEPARTMENT_OTHER): Payer: Self-pay

## 2024-04-23 ENCOUNTER — Other Ambulatory Visit: Payer: Self-pay

## 2024-04-23 DIAGNOSIS — C44529 Squamous cell carcinoma of skin of other part of trunk: Secondary | ICD-10-CM | POA: Diagnosis not present

## 2024-04-23 DIAGNOSIS — L820 Inflamed seborrheic keratosis: Secondary | ICD-10-CM | POA: Diagnosis not present

## 2024-04-23 MED ORDER — AMLODIPINE BESYLATE 5 MG PO TABS
5.0000 mg | ORAL_TABLET | Freq: Two times a day (BID) | ORAL | 3 refills | Status: AC
  Filled 2024-04-23 – 2024-05-19 (×2): qty 180, 90d supply, fill #0

## 2024-04-24 ENCOUNTER — Other Ambulatory Visit: Payer: Self-pay | Admitting: Cardiovascular Disease

## 2024-05-19 ENCOUNTER — Other Ambulatory Visit (HOSPITAL_BASED_OUTPATIENT_CLINIC_OR_DEPARTMENT_OTHER): Payer: Self-pay

## 2024-05-27 ENCOUNTER — Other Ambulatory Visit (HOSPITAL_BASED_OUTPATIENT_CLINIC_OR_DEPARTMENT_OTHER): Payer: Self-pay

## 2024-06-02 ENCOUNTER — Other Ambulatory Visit (HOSPITAL_BASED_OUTPATIENT_CLINIC_OR_DEPARTMENT_OTHER): Payer: Self-pay

## 2024-06-11 ENCOUNTER — Other Ambulatory Visit (HOSPITAL_BASED_OUTPATIENT_CLINIC_OR_DEPARTMENT_OTHER): Payer: Self-pay

## 2024-06-19 ENCOUNTER — Other Ambulatory Visit (HOSPITAL_BASED_OUTPATIENT_CLINIC_OR_DEPARTMENT_OTHER): Payer: Self-pay

## 2024-07-22 ENCOUNTER — Ambulatory Visit: Admitting: Cardiovascular Disease

## 2024-07-30 ENCOUNTER — Ambulatory Visit: Admitting: Vascular Surgery

## 2024-07-30 ENCOUNTER — Ambulatory Visit (HOSPITAL_COMMUNITY)
